# Patient Record
Sex: Female | Born: 1955
Health system: Southern US, Community
[De-identification: ages and names within clinical notes are randomized; demographics above are authoritative.]

## PROBLEM LIST (undated history)

## (undated) DIAGNOSIS — E119 Type 2 diabetes mellitus without complications: Secondary | ICD-10-CM

## (undated) DIAGNOSIS — R809 Proteinuria, unspecified: Secondary | ICD-10-CM

## (undated) DIAGNOSIS — I499 Cardiac arrhythmia, unspecified: Secondary | ICD-10-CM

## (undated) DIAGNOSIS — J309 Allergic rhinitis, unspecified: Secondary | ICD-10-CM

## (undated) DIAGNOSIS — M1990 Unspecified osteoarthritis, unspecified site: Secondary | ICD-10-CM

## (undated) DIAGNOSIS — E039 Hypothyroidism, unspecified: Secondary | ICD-10-CM

## (undated) DIAGNOSIS — E781 Pure hyperglyceridemia: Secondary | ICD-10-CM

## (undated) DIAGNOSIS — Z950 Presence of cardiac pacemaker: Secondary | ICD-10-CM

## (undated) DIAGNOSIS — I2510 Atherosclerotic heart disease of native coronary artery without angina pectoris: Secondary | ICD-10-CM

## (undated) DIAGNOSIS — R001 Bradycardia, unspecified: Secondary | ICD-10-CM

## (undated) DIAGNOSIS — K219 Gastro-esophageal reflux disease without esophagitis: Secondary | ICD-10-CM

## (undated) DIAGNOSIS — I10 Essential (primary) hypertension: Secondary | ICD-10-CM

## (undated) HISTORY — DX: Proteinuria, unspecified: R80.9

## (undated) HISTORY — DX: Pure hyperglyceridemia: E78.1

## (undated) HISTORY — DX: Type 2 diabetes mellitus without complications: E11.9

## (undated) HISTORY — PX: OTHER SURGICAL HISTORY: SHX169

## (undated) HISTORY — PX: PACEMAKER INSERTION: SHX728

## (undated) HISTORY — DX: Hypothyroidism, unspecified: E03.9

## (undated) HISTORY — DX: Essential (primary) hypertension: I10

## (undated) HISTORY — DX: Gastro-esophageal reflux disease without esophagitis: K21.9

## (undated) HISTORY — DX: Unspecified osteoarthritis, unspecified site: M19.90

## (undated) HISTORY — DX: Atherosclerotic heart disease of native coronary artery without angina pectoris: I25.10

## (undated) HISTORY — DX: Allergic rhinitis, unspecified: J30.9

---

## 1987-10-25 HISTORY — PX: CYSTECTOMY: SUR359

## 1992-10-24 HISTORY — PX: REFRACTIVE SURGERY: SHX103

## 1998-02-03 ENCOUNTER — Ambulatory Visit (HOSPITAL_COMMUNITY): Admission: RE | Admit: 1998-02-03 | Discharge: 1998-02-03 | Payer: Self-pay | Admitting: Cardiovascular Disease

## 1998-02-04 ENCOUNTER — Ambulatory Visit (HOSPITAL_COMMUNITY): Admission: RE | Admit: 1998-02-04 | Discharge: 1998-02-05 | Payer: Self-pay | Admitting: Cardiovascular Disease

## 2000-11-27 ENCOUNTER — Encounter: Admission: RE | Admit: 2000-11-27 | Discharge: 2001-02-25 | Payer: Self-pay | Admitting: Internal Medicine

## 2002-02-28 ENCOUNTER — Ambulatory Visit (HOSPITAL_COMMUNITY): Admission: RE | Admit: 2002-02-28 | Discharge: 2002-02-28 | Payer: Self-pay | Admitting: Dermatology

## 2002-03-19 ENCOUNTER — Ambulatory Visit (HOSPITAL_COMMUNITY): Admission: RE | Admit: 2002-03-19 | Discharge: 2002-03-19 | Payer: Self-pay | Admitting: Dermatology

## 2006-06-23 ENCOUNTER — Ambulatory Visit: Payer: Self-pay | Admitting: Cardiology

## 2006-08-08 ENCOUNTER — Ambulatory Visit: Payer: Self-pay | Admitting: Cardiology

## 2007-07-06 ENCOUNTER — Ambulatory Visit: Payer: Self-pay | Admitting: Cardiology

## 2007-07-19 ENCOUNTER — Ambulatory Visit: Payer: Self-pay | Admitting: Cardiology

## 2007-07-19 LAB — CONVERTED CEMR LAB
ALT: 18 units/L (ref 0–35)
BUN: 5 mg/dL — ABNORMAL LOW (ref 6–23)
Creatinine, Ser: 0.5 mg/dL (ref 0.4–1.2)
GFR calc Af Amer: 169 mL/min
Glucose, Bld: 124 mg/dL — ABNORMAL HIGH (ref 70–99)
Total Bilirubin: 0.8 mg/dL (ref 0.3–1.2)
Total Protein: 6.5 g/dL (ref 6.0–8.3)
Triglycerides: 187 mg/dL — ABNORMAL HIGH (ref 0–149)
VLDL: 37 mg/dL (ref 0–40)

## 2008-01-09 ENCOUNTER — Ambulatory Visit: Payer: Self-pay | Admitting: Cardiology

## 2008-01-17 ENCOUNTER — Ambulatory Visit: Payer: Self-pay

## 2008-07-17 ENCOUNTER — Ambulatory Visit: Payer: Self-pay | Admitting: Cardiology

## 2009-05-20 ENCOUNTER — Ambulatory Visit (HOSPITAL_COMMUNITY): Admission: RE | Admit: 2009-05-20 | Discharge: 2009-05-20 | Payer: Self-pay | Admitting: Family Medicine

## 2009-05-22 ENCOUNTER — Encounter (INDEPENDENT_AMBULATORY_CARE_PROVIDER_SITE_OTHER): Payer: Self-pay | Admitting: *Deleted

## 2009-05-29 ENCOUNTER — Ambulatory Visit (HOSPITAL_COMMUNITY): Admission: RE | Admit: 2009-05-29 | Discharge: 2009-05-29 | Payer: Self-pay | Admitting: Family Medicine

## 2009-06-08 ENCOUNTER — Encounter: Payer: Self-pay | Admitting: Internal Medicine

## 2009-06-22 DIAGNOSIS — E119 Type 2 diabetes mellitus without complications: Secondary | ICD-10-CM

## 2009-06-22 DIAGNOSIS — I10 Essential (primary) hypertension: Secondary | ICD-10-CM | POA: Insufficient documentation

## 2009-06-22 DIAGNOSIS — J309 Allergic rhinitis, unspecified: Secondary | ICD-10-CM | POA: Insufficient documentation

## 2009-06-26 ENCOUNTER — Ambulatory Visit: Payer: Self-pay | Admitting: Cardiology

## 2009-06-26 DIAGNOSIS — E783 Hyperchylomicronemia: Secondary | ICD-10-CM | POA: Insufficient documentation

## 2009-06-26 DIAGNOSIS — M79609 Pain in unspecified limb: Secondary | ICD-10-CM

## 2009-07-06 ENCOUNTER — Ambulatory Visit: Payer: Self-pay | Admitting: Internal Medicine

## 2009-07-06 ENCOUNTER — Encounter: Payer: Self-pay | Admitting: Internal Medicine

## 2009-07-06 ENCOUNTER — Ambulatory Visit (HOSPITAL_COMMUNITY): Admission: RE | Admit: 2009-07-06 | Discharge: 2009-07-06 | Payer: Self-pay | Admitting: Internal Medicine

## 2009-07-08 ENCOUNTER — Encounter: Payer: Self-pay | Admitting: Internal Medicine

## 2009-07-09 ENCOUNTER — Ambulatory Visit: Payer: Self-pay

## 2009-07-09 ENCOUNTER — Ambulatory Visit: Payer: Self-pay | Admitting: Cardiology

## 2009-07-10 LAB — CONVERTED CEMR LAB
BUN: 8 mg/dL (ref 6–23)
Chloride: 101 meq/L (ref 96–112)
Cholesterol: 178 mg/dL (ref 0–200)
Creatinine, Ser: 0.5 mg/dL (ref 0.4–1.2)
GFR calc non Af Amer: 138.41 mL/min (ref >60)
Glucose, Bld: 68 mg/dL — ABNORMAL LOW (ref 70–99)
Potassium: 3.6 meq/L (ref 3.5–5.1)
Sodium: 139 meq/L (ref 135–145)
Total Bilirubin: 0.9 mg/dL (ref 0.3–1.2)
Total CHOL/HDL Ratio: 5
Total Protein: 7.4 g/dL (ref 6.0–8.3)
Triglycerides: 177 mg/dL — ABNORMAL HIGH (ref 0.0–149.0)

## 2009-07-13 ENCOUNTER — Encounter: Payer: Self-pay | Admitting: Cardiology

## 2010-04-05 ENCOUNTER — Telehealth (INDEPENDENT_AMBULATORY_CARE_PROVIDER_SITE_OTHER): Payer: Self-pay | Admitting: *Deleted

## 2010-04-06 ENCOUNTER — Ambulatory Visit (HOSPITAL_COMMUNITY): Admission: RE | Admit: 2010-04-06 | Discharge: 2010-04-06 | Payer: Self-pay | Admitting: Family Medicine

## 2010-10-04 ENCOUNTER — Ambulatory Visit: Payer: Self-pay | Admitting: Cardiology

## 2010-10-04 ENCOUNTER — Encounter (INDEPENDENT_AMBULATORY_CARE_PROVIDER_SITE_OTHER): Payer: Self-pay | Admitting: *Deleted

## 2010-10-05 ENCOUNTER — Encounter: Payer: Self-pay | Admitting: Cardiology

## 2010-11-19 ENCOUNTER — Ambulatory Visit (HOSPITAL_COMMUNITY)
Admission: RE | Admit: 2010-11-19 | Discharge: 2010-11-19 | Payer: Self-pay | Source: Home / Self Care | Attending: Family Medicine | Admitting: Family Medicine

## 2010-11-25 NOTE — Progress Notes (Signed)
  Faxed Pt ROI, she completed faxed back to me, I faxed Dopplers over to [facility] in [place] to [phone].Marland KitchenMarland KitchenCall back [phone] [facility]  April 05, 2010 10:42 AM

## 2010-11-25 NOTE — Letter (Signed)
Summary: [place] [employer] Work [employer], agricultural at [employer]  [address], [place] [address]   Phone: [phone]  Fax: [phone]     October 04, 2010 MRN: [id number]   Jordan Perry [address] [place], [place]  [address]      YOUR LAB WORK IS DUE   December 03, 2010  Please go to [facility], located across the street from [facility] on the second floor.  Hours are Monday - Friday 7am until 7:30pm         Saturday 8am until 12noon    _X_  DO NOT EAT OR DRINK AFTER MIDNIGHT EVENING PRIOR TO LABWORK

## 2010-11-25 NOTE — Assessment & Plan Note (Signed)
Summary: past due for f/u per pt phone call/tg   Visit Type:  Follow-up Primary Domique Clapper:  Dr.Golding  CC:  no cardiology complaints.  History of Present Illness: Jordan Perry comes in today for evaluation and management of subclinical coronary artery disease and multiple cardiac risk factors.  She's having no angina or ischemic symptoms with routine work and hard housework. We performed a stress nuclear study in 2009 which showed no ischemia.  On her last visit, I was concerned she may be having symptoms of intermittent claudication. However, arterial Dopplers were normal.  She denies any orthopnea, PND but has had some mild edema. She had a spider bite on her leg that several trips to the wound center to clear.  She is on 80 mg of simvastatin. She has not had lipids in the past year.  Her hemoglobin A1c runs about 7%. Her weight is down 20 pounds.  Current Medications (verified): 1)  Lipitor 80 Mg Tabs (Atorvastatin Calcium) .... Take 1 Tablet By Mouth Once A Day 2)  Aspirin 81 Mg Tbec (Aspirin) .... Take One Tablet By Mouth Daily 3)  Metformin Hcl 500 Mg Tabs (Metformin Hcl) .... 2 Tabs Two Times A Day 4)  Trivora (28)  Tabs (Levonorg-Eth Estrad Triphasic) .... As Directed 5)  Novolog Mix 70/30 Flexpen 70-30 % Susp (Insulin Aspart Prot & Aspart) .... Sliding Scale 6)  Lisinopril 20 Mg Tabs (Lisinopril) .... Take One Tablet By Mouth Daily 7)  Synthroid 75 Mcg Tabs (Levothyroxine Sodium) .... Take 1 Tab Daily 8)  Vitamin C 1000 Mg Tabs (Ascorbic Acid) .... Take 1 Tab Daily 9)  Azithromycin 250 Mg Tabs (Azithromycin) .... Take 1 Tab Daily  Allergies (verified): 1)  ! Pcn 2)  ! Prednisone  Comments:  Nurse/Medical Assistant: patient brought med list reviewed with patient she is on a z-pak for sinus infection  Past History:  Past Medical History: Last updated: 06/22/2009 CAD, UNSPECIFIED SITE/ SUBCLINICAL (ICD-414.00) HYPERTRIGLYCERIDEMIA (ICD-272.1) HYPERTENSION, UNSPECIFIED  (ICD-401.9) DIABETES MELLITUS, TYPE II (ICD-250.00) ALLERGIC RHINITIS (ICD-477.9)      Past Surgical History: Last updated: 06/22/2009 R-K eye surgery..[id number] Cyst removed off end of spine..1989  Family History: Last updated: 06/22/2009 Family History of Coronary Artery Disease:  Family History of Diabetes:  Family History of Obesity: Significant for heart disease in her father.  Brother who died of a heart attack at age 55, was a patient of mine and a sister who is about her age who has coronary disease. Family History of Hyperlipidemia:  Family History of Hypertension:  Mother: deceased at 82 COPD Father: deceased at 6 Heart&Cancer  Social History: Last updated: 06/22/2009 She is single.  She lives in [place].  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 Tobacco Use - No.  Alcohol Use - no Drug Use - no  Risk Factors: Smoking Status: never (06/22/2009)  Review of Systems       negative other than history of present illness  Vital Signs:  Patient profile:   55 year old female Weight:      235 pounds BMI:     40.48 O2 Sat:      97 % on Room air Pulse rate:   77 / minute BP sitting:   140 / 78  (right arm)  Vitals Entered By: Dreama Saa, CNA (October 04, 2010 9:48 AM)  O2 Flow:  Room air  Physical Exam  General:  obese.  no acute distressobese.   Head:  normocephalic and atraumatic Eyes:  PERRLA/EOM intact; conjunctiva and lids normal. Neck:  Neck supple, no JVD. No masses, thyromegaly or abnormal cervical nodes. Chest Wall:  no deformities or breast masses noted Lungs:  Clear bilaterally to auscultation and percussion. Heart:  Pdifficult to appreciate, regular rate and rhythm, normal S1-S2 splits, carotid upstrokes equal bilaterally without bruits. Msk:  Back normal, normal gait. Muscle strength and tone normal. Pulses:  diminished but present the lower extremity Extremities:  chronic changes in the lower legs above the ankles. Only  trace pitting edema. Neurologic:  Alert and oriented x 3. Skin:  Intact without lesions or rashes. Psych:  Normal affect.   EKG  Procedure date:  10/04/2010  Findings:      normal sinus rhythm, normal EKG  Impression & Recommendations:  Problem # 1:  CAD, UNSPECIFIED SITE/ SUBCLINICAL (ICD-414.00) Assessment Unchanged we'll continue aggressive risk factor modification. Her updated medication list for this problem includes:    Aspirin 81 Mg Tbec (Aspirin) .Marland Kitchen... Take one tablet by mouth daily    Lisinopril 20 Mg Tabs (Lisinopril) .Marland Kitchen... Take one tablet by mouth daily  Problem # 2:  HYPERTENSION, UNSPECIFIED (ICD-401.9) Assessment: Deteriorated Will increase her lisinopril to 20 mg per day. Patient informed that systolic blood pressure goal is 130 or less. Her updated medication list for this problem includes:    Aspirin 81 Mg Tbec (Aspirin) .Marland Kitchen... Take one tablet by mouth daily    Lisinopril 20 Mg Tabs (Lisinopril) .Marland Kitchen... Take one tablet by mouth daily  Problem # 3:  HYPERLIPIDEMIA TYPE I / IV (ICD-272.3) Will change to atorvastatin 80 mg q. day. Followup blood work in 6 weeks. Her updated medication list for this problem includes:    Lipitor 80 Mg Tabs (Atorvastatin calcium) .Marland Kitchen... Take 1 tablet by mouth once a day  Future Orders: T-Lipid Profile ([id number]) ... 12/03/2010 T-Comprehensive Metabolic Panel [id number]) ... 12/03/2010  Problem # 4:  DIABETES MELLITUS, TYPE II (ICD-250.00) Encouraged to continue to lose weight and decrease her hemoglobin A1c further. The following medications were removed from the medication list:    Actos 15 Mg Tabs (Pioglitazone hcl) .Marland Kitchen... 1 tab once daily Her updated medication list for this problem includes:    Aspirin 81 Mg Tbec (Aspirin) .Marland Kitchen... Take one tablet by mouth daily    Metformin Hcl 500 Mg Tabs (Metformin hcl) .Marland Kitchen... 2 tabs two times a day    Novolog Mix 70/30 Flexpen 70-30 % Susp (Insulin aspart prot & aspart) ..... Sliding  scale    Lisinopril 20 Mg Tabs (Lisinopril) .Marland Kitchen... Take one tablet by mouth daily  Problem # 5:  LEG PAIN (ICD-729.5) Assessment: Unchanged  Patient Instructions: 1)  Your physician recommends that you schedule a follow-up appointment in: 1 2)  Your physician recommends that you return for lab work in: 6 weeks 3)  Your physician has recommended you make the following change in your medication: stop simvastatin after current rx complete and begin lipitor 80mg  dialy, increase lisinopril to 20mg  daily Prescriptions: LISINOPRIL 20 MG TABS (LISINOPRIL) Take one tablet by mouth daily  #90 x 3   Entered by:   Teressa Lower RN   Authorized by:   Gaylord Shih, MD, [facility]   Signed by:   Teressa Lower RN on 10/04/2010   Method used:   Faxed to ...       [facility]* (retail)             ,          Ph: [phone]  Fax: [phone]   RxID:   [id number] LIPITOR 80 MG TABS (ATORVASTATIN CALCIUM) Take 1 tablet by mouth once a day  #90 x 3   Entered by:   Teressa Lower RN   Authorized by:   Gaylord Shih, MD, [facility]   Signed by:   Teressa Lower RN on 10/04/2010   Method used:   Print then Give to Patient   RxID:   [id number]

## 2010-12-03 ENCOUNTER — Other Ambulatory Visit: Payer: Self-pay | Admitting: Orthopedic Surgery

## 2010-12-03 DIAGNOSIS — M545 Low back pain: Secondary | ICD-10-CM

## 2010-12-04 ENCOUNTER — Ambulatory Visit
Admission: RE | Admit: 2010-12-04 | Discharge: 2010-12-04 | Disposition: A | Discharge status: Home / Self Care | Payer: BC Managed Care – PPO | Source: Ambulatory Visit | Attending: Orthopedic Surgery | Admitting: Orthopedic Surgery

## 2010-12-04 DIAGNOSIS — M545 Low back pain: Secondary | ICD-10-CM

## 2010-12-10 ENCOUNTER — Encounter: Payer: Self-pay | Admitting: Cardiology

## 2010-12-14 ENCOUNTER — Encounter: Payer: Self-pay | Admitting: Cardiology

## 2010-12-17 LAB — CONVERTED CEMR LAB
AST: 7 units/L (ref 0–37)
Albumin: 3.6 g/dL (ref 3.5–5.2)
Alkaline Phosphatase: 94 units/L (ref 39–117)
Calcium: 9 mg/dL (ref 8.4–10.5)
Glucose, Bld: 122 mg/dL — ABNORMAL HIGH (ref 70–99)
LDL Cholesterol: 52 mg/dL (ref 0–99)
Total Bilirubin: 0.3 mg/dL (ref 0.3–1.2)
Total CHOL/HDL Ratio: 2.9
Triglycerides: 126 mg/dL (ref <150)

## 2010-12-21 NOTE — Letter (Signed)
Summary: External Correspondence  External Correspondence   Imported By: Faythe Ghee 12/14/2010 13:29:50  _____________________________________________________________________  External Attachment:    Type:   Image     Comment:   External Document  Appended Document: External Correspondence Dr. Vern Claude patient. Will forward.

## 2011-03-08 NOTE — Assessment & Plan Note (Signed)
[facility]CARE                            CARDIOLOGY OFFICE NOTE   NAME:Perry, Jordan ARCHIBALD                      MRN:          [id number]  DATE:01/09/2008                            DOB:          [birth year]    [name] returns today for further management of her multiple cardiac  risk factors and subclinical coronary disease.  She has a very strong  family history of coronary disease, type 2 diabetes, obesity, sedentary  lifestyle, hyperlipidemia, and hypertension.   She denies any symptoms of angina or ischemia.  We obtained blood work  on July 19, 2007 which showed a total cholesterol of 133,  triglycerides 187, HDL 40.2, LDL 55 down from 104.  This on simvastatin  40 mg q.h.s.  Her LFTs were normal, and her chemistries were normal.   She says her blood pressure runs about 130/75.  She is always a little  bit high in the office.  Her blood sugar runs about 130 on the average.  She is now taking Lantus injection 40 units at night.   The last time she was here, I placed her on lisinopril for renal  preservation.  She is on lisinopril 10 mg a day.   MEDICATIONS:  1. Lisinopril 10 mg a day.  2. Aspirin 81 mg a day.  3. Glipizide XL 10 mg b.i.d.  4. Actos 15 mg a day.  5. Women's multivitamin.  6. Lantus injection 40 units per day.  7. Metformin 1000 mg p.o. b.i.d..  8. Simvastatin 40 mg p.o. q.h.s.  9. Lisinopril 10 mg a day.  10.Trivora tablets birth control.   PHYSICAL EXAMINATION:  VITAL SIGNS:  Blood pressure 140/86, her pulse is  92 and regular.  Weight is 243.  HEENT:  Unchanged.  NECK:  Carotid upstrokes are equal bilateral without bruits, no JVD.  Thyroid is not enlarged.  Trachea is midline.  LUNGS:  Clear.  HEART:  Poorly appreciated PMI.  She has soft S1-S2 that splits  physiologically.  ABDOMEN:  Obese with good bowel sounds.  Organomegaly could not be  assessed.  EXTREMITIES:  Only trace edema.  Pulses are present.  NEUROLOGIC:  Intact.  SKIN:  Unremarkable.   Electrocardiogram shows sinus rhythm with a rightward axis.  She has  some nonspecific ST-segment changes.   [name] is at high risk for an acute coronary syndrome with her  multiple risk factors, particularly her premature family history.  We  reinforced all the therapy lifestyle changes that she needs to embrace.  We have arranged for her to have a rest stress adenosine Myoview, last  being March of 2007.  She will most likely have silent ischemia or an  acute coronary event without symptoms.  This seems to be the way it has  occurred in her family in the past.   Assuming this is negative for obstructive coronary disease or ischemia,  I will see her back again in September.  At that time, she will need  blood work including lipids LFTs, and a comprehensive metabolic panel.   I have also increased her lisinopril to  20 mg a day for better blood  pressure control not to mention renal sparing effects.     Thomas C. Daleen Squibb, MD, [facility]  Electronically Signed    TCW/MedQ  DD: 01/09/2008  DT: 01/10/2008  Job #: [id number]   cc:   Patrica Duel, M.D.

## 2011-03-08 NOTE — Assessment & Plan Note (Signed)
[facility] HEALTHCARE                            CARDIOLOGY OFFICE NOTE   NAME:Jordan Perry, Jordan Perry                      MRN:          [id number]  DATE:07/06/2007                            DOB:          04/12/1956    [name] comes in today for further management of her cardiac risk  factors and subclinical coronary disease.  Please see my note from  June 23, 2006.   Her initial response to simvastatin showed an LDL of above 100.  We  bumped it to 40 mg.  She has not had a followup blood work.   Her diabetes is being aggressively treated by Dr. Talmage Nap.  I do not have  any of her numbers, but she is on a very complex regimen.   Her weight is down about 8 to 10 pounds.  She is having no angina or  ischemic symptoms.  Her blood pressure is usually under good control,  she says, unless she is stressed.  She was stressed this morning because  of a flat tire.   MEDICATIONS:  1. Aspirin 81 mg a day.  2. Actos 15 mg a day.  3. Lantus injection.  4. Metformin extended release 500 b.i.d.  5. Colchicine 0.6 b.i.d.  6. Lexapro 20 mg daily.  7. Simvastatin 40 mg nightly.   Blood pressure is 142/92 with a large cuff.  Pulse is 76 and regular.  Her EKG is normal, except for a slight rightward axis.  Her weight is  down to 226.  HEENT:  Normocephalic, atraumatic.  PERRLA.  Extraocular muscles are  intact.  Sclerae clear.  Facial symmetry is normal.  Carotids are full.  There are no bruits.  There is no JVD.  Thyroid is  not enlarged.  LUNGS:  Clear.  HEART:  Reveals a nondisplaced PMI.  She has a normal S1, S2.  ABDOMEN:  Obese with good bowel sounds.  Organomegaly cannot be  assessed.  EXTREMITIES:  Reveal 1+ edema.  Pulses are intact.  NEURO:  Grossly intact.   I am concerned about [name]'s blood pressure and lack of followup blood  work.   With her diabetes, I placed her on low-dose lisinopril 10 mg a day for  her blood pressure and preserving renal function.   I have also put her  in the computer for fasting lipids, LFTs, and a CHEM-7.  Assuming these  are stable, I will see her back in a year.     Thomas C. Daleen Squibb, MD, [facility]  Electronically Signed    TCW/MedQ  DD: 07/06/2007  DT: 07/07/2007  Job #: [id number]   cc:   Patrica Duel, M.D.  Dorisann Frames, M.D.

## 2011-03-08 NOTE — Assessment & Plan Note (Signed)
[facility] HEALTHCARE                            CARDIOLOGY OFFICE NOTE   NAME:Jordan Perry, Jordan Perry                      MRN:          [id number]  DATE:07/17/2008                            DOB:          December 22, 1955    Jordan Perry comes in today for followup with her multiple cardiac risk  factors.   She is having no angina or ischemic symptoms.  She says her blood sugar  has been under good control with the help of Dr. Talmage Nap.  She saw Dr.  Talmage Nap this morning, had blood drawn including lipids.   She had a negative stress Myoview ruling out obstructive coronary artery  disease on January 17, 2008.  Her EF was 68%.   Of note, she is no longer on lisinopril.  She says Dr. Talmage Nap took her  off it.  I had increased it from 10-20 last visit because of blood  pressure.   PHYSICAL EXAMINATION:  VITAL SIGNS:  Her blood pressure today is 118/70,  her pulse 87 and regular, weight is 245.  HEENT:  Normal.  NECK:  Carotid upstrokes are equal bilaterally without bruits.  No JVD.  Thyroid is not enlarged.  Trachea is midline.  LUNGS:  Clear.  HEART:  Reveals poorly appreciated PMI.  Normal S1 and S2.  ABDOMEN:  Soft, good bowel sounds.  No midline bruit.  No hepatomegaly.  EXTREMITIES:  There were no cyanosis, clubbing.  There is trace edema.  Pulses are intact.  NEUROLOGIC:  Intact.   EKG is normal except for slight rightward axis.   ASSESSMENT AND PLAN:  Jordan Perry needs to go back on lisinopril 10 mg a  day for at least renal protection and better blood pressure control.  I  have placed her on 10 mg a day.  Assuming she is doing well, I will see  her back in a year.      Thomas C. Daleen Squibb, MD, [facility]  Electronically Signed    TCW/MedQ  DD: 07/17/2008  DT: 07/18/2008  Job #: [id number]   cc:   Dorisann Frames, M.D.  Patrica Duel, M.D.

## 2011-03-11 NOTE — Assessment & Plan Note (Signed)
[facility] HEALTHCARE                              CARDIOLOGY OFFICE NOTE   Jordan Perry                    MRN:          [id number]  DATE:06/23/2006                            DOB:          17-Nov-1955    Ms. Jordan Perry returns today after not seeing [place] since 2001.   She is a delightful 55 year old white female, sister of a patient of mine,  who has very strong family history of coronary disease, diabetes, and  obesity.   She is followed by Dr. Nobie Putnam at [facility] in [place].   She had a stress Persantine Myoview on December 27, 2005, at [facility].  This showed no evidence of ischemia or infarct.  Ejection fraction was not performed.   She has had no angina or ischemia symptoms.  She does have high risk and is  quite concerned about being followed closely by a cardiologist.   PAST MEDICAL HISTORY:  SHE IS INTOLERANT OF:  1. PENICILLIN.  2. PREDNISONE.   MEDICATIONS:  1. Aspirin 81 mg a day.  2. Glipizide XL 10 mg b.i.d.  3. __________  50/1,000 b.i.d.  4. Actos 15 mg a day.  5. Women's multivitamin.  6. Bioflex.  7. __________ .   She does not smoke or drink.   FAMILY HISTORY:  Significant for heart disease in her father.  Brother who  died of a heart attack at age 32, was a patient of mine and a sister who is  about her age who has coronary disease.   SOCIAL HISTORY:  She is single.  She lives in [place].  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   REVIEW OF SYSTEMS:  Other than the HPI, is really negative.  Her fasting  blood sugars have dropped down to the low 100s from 200, since she saw Dr.  Talmage Nap at [facility] for her diabetes.   PHYSICAL EXAMINATION:  VITAL SIGNS:  Today her blood pressure is 132/88.  Her up is 81 and regular.  She is 5 foot 4 inches and weighs 233 pounds.  HEENT:  Normocephalic atraumatic.  PERRLA.  Extraocular movements  intact.  Facial symmetry is normal.  Dentition is satisfactory.  Carotids are full  bilaterally without bruits.  No JVD.  Thyroid is not enlarged.  Trachea is  midline.  LUNGS:  Clear.  HEART:  Reveals a soft S1 S2 without murmur, gallop or rub.  ABDOMEN:  Soft with good bowel sounds.  EXTREMITIES:  Reveal no edema.  Pulses are present.   Her EKG is normal.   ASSESSMENT/PLAN:  Jordan Perry currently has subclinical coronary disease, given  her diabetes and her other risk factors.  I am delighted that her blood  sugar is under better control.  I have encouraged her to loose weight.   In addition, I note that her total cholesterol on no medicine is 176,  triglycerides 224, HDL 45, LDL 86.  With her being a coronary disease  equivalent, I have asked her to go on Simvastatin 20 mg q.h.s.  I would like  her LDL as low as possible.  I would see a slight increase in her HDL as  well.   I will follow blood work on her in 6 weeks, otherwise I will see her back in  a year.                               Thomas C. Daleen Squibb, MD, [facility]    TCW/MedQ  DD:  06/23/2006  DT:  06/23/2006  Job #:  [id number]   cc:   Dorisann Frames, MD  Patrica Duel, MD

## 2011-09-26 ENCOUNTER — Other Ambulatory Visit (HOSPITAL_COMMUNITY): Payer: Self-pay | Admitting: Anesthesiology

## 2011-09-26 ENCOUNTER — Ambulatory Visit (HOSPITAL_COMMUNITY)
Admission: RE | Admit: 2011-09-26 | Discharge: 2011-09-26 | Disposition: A | Discharge status: Home / Self Care | Payer: BC Managed Care – PPO | Source: Ambulatory Visit | Attending: Anesthesiology | Admitting: Anesthesiology

## 2011-09-26 DIAGNOSIS — R52 Pain, unspecified: Secondary | ICD-10-CM

## 2011-09-26 DIAGNOSIS — M542 Cervicalgia: Secondary | ICD-10-CM | POA: Insufficient documentation

## 2011-09-26 DIAGNOSIS — M25519 Pain in unspecified shoulder: Secondary | ICD-10-CM | POA: Insufficient documentation

## 2011-10-03 ENCOUNTER — Other Ambulatory Visit: Payer: Self-pay | Admitting: Cardiology

## 2011-10-05 ENCOUNTER — Encounter: Payer: Self-pay | Admitting: *Deleted

## 2011-10-06 ENCOUNTER — Ambulatory Visit (INDEPENDENT_AMBULATORY_CARE_PROVIDER_SITE_OTHER): Payer: BC Managed Care – PPO | Admitting: Cardiology

## 2011-10-06 ENCOUNTER — Encounter: Payer: Self-pay | Admitting: Cardiology

## 2011-10-06 VITALS — BP 126/76 | HR 82 | Ht 64.0 in | Wt 233.0 lb

## 2011-10-06 DIAGNOSIS — I10 Essential (primary) hypertension: Secondary | ICD-10-CM

## 2011-10-06 DIAGNOSIS — E783 Hyperchylomicronemia: Secondary | ICD-10-CM

## 2011-10-06 DIAGNOSIS — E669 Obesity, unspecified: Secondary | ICD-10-CM | POA: Insufficient documentation

## 2011-10-06 DIAGNOSIS — E119 Type 2 diabetes mellitus without complications: Secondary | ICD-10-CM

## 2011-10-06 DIAGNOSIS — I2510 Atherosclerotic heart disease of native coronary artery without angina pectoris: Secondary | ICD-10-CM

## 2011-10-06 NOTE — Patient Instructions (Signed)
Your physician wants you to follow-up in: 1 year with Dr. Daleen Squibb. You will receive a reminder letter in the mail two months in advance. If you don't receive a letter, please call our office to schedule the follow-up appointment.  Your physician encouraged you to lose weight for better health.  Continue to work on reducing your carbohydrates and improving your Hemoglobin A1C

## 2011-10-06 NOTE — Assessment & Plan Note (Signed)
With her multiple cardiac risk factors, not to mention diabetes, she has chronically occult coronary disease. He is asymptomatic. She knows what to look for as far as angina or ischemic symptoms.  I will see her back in a year. I have strongly encouraged her to get her hemoglobin A1c 7%. She'll continue with her secondary preventative strategies.

## 2011-10-06 NOTE — Progress Notes (Signed)
HPI Jordan Perry comes in today for evaluation and management of her subclinical coronary disease. His multiple cardiac risk factors as outlined in previous notes. She is having no angina or ischemic symptoms when carefully questioned.  Her last hemoglobin A1c was 8.2%. She is being followed by endocrinology for both that and her lipids.  Her weight is unchanged. She does not exercise on a regular basis. She says she's bad to eat late at night after getting home from work.  She denies orthopnea, PND or edema. She denies palpitations or presyncope.  Past Medical History  Diagnosis Date  . Coronary atherosclerosis of unspecified type of vessel, native or graft   . Pure hyperglyceridemia   . Unspecified essential hypertension   . Type II or unspecified type diabetes mellitus without mention of complication, not stated as uncontrolled   . Allergic rhinitis, cause unspecified   . Hypothyroidism   . Proteinuria     Current Outpatient Prescriptions  Medication Sig Dispense Refill  . aspirin 81 MG tablet Take 81 mg by mouth daily.        Marland Kitchen atorvastatin (LIPITOR) 80 MG tablet TAKE 1 TABLET DAILY  90 tablet  2  . exenatide (BYETTA) 5 MCG/0.02ML SOLN Inject 5 mcg into the skin 2 (two) times daily with a meal.        . furosemide (LASIX) 40 MG tablet Take 40 mg by mouth daily.        . insulin aspart protamine-insulin aspart (NOVOLOG 70/30) (70-30) 100 UNIT/ML injection Inject into the skin. Sliding scale        . levothyroxine (SYNTHROID, LEVOTHROID) 75 MCG tablet Take 75 mcg by mouth daily.        Marland Kitchen lisinopril (PRINIVIL,ZESTRIL) 20 MG tablet TAKE 1 TABLET DAILY  90 tablet  2  . metFORMIN (GLUCOPHAGE) 500 MG tablet Take 500 mg by mouth 2 (two) times daily with a meal.          Allergies  Allergen Reactions  . Penicillins     REACTION: rash, edema  . Prednisone     Family History  Problem Relation Age of Onset  . Coronary artery disease    . Diabetes    . Obesity    . Heart disease  Father     and heart problems (unspecified0  . Heart attack Brother 50  . Coronary artery disease Sister   . Hyperlipidemia    . Hypertension    . COPD Mother 87  . Cancer Father 60    History   Social History  . Marital Status: Married    Spouse Name: N/A    Number of Children: 0  . Years of Education: N/A   Occupational History  . 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 Smoking status: Never Smoker   . Smokeless tobacco: Never Used  . Alcohol Use: No  . Drug Use: No  . Sexually Active: Not on file   Other Topics Concern  . Not on file   Social History Narrative  . No narrative on file    ROS ALL NEGATIVE EXCEPT THOSE NOTED IN HPI  PE  General Appearance: well developed, well nourished in no acute distress, obese HEENT: symmetrical face, PERRLA, good dentition  Neck: no JVD, thyromegaly, or adenopathy, trachea midline Chest: symmetric without deformity Cardiac: PMI non-displaced, RRR, normal S1, S2, no gallop or murmur Lung: clear to ausculation and percussion Vascular: all pulses full without  bruits  Abdominal: nondistended, nontender, good bowel sounds, no HSM, no bruits Extremities: no cyanosis, clubbing or edema, no sign of DVT, no varicosities  Skin: normal color, no rashes Neuro: alert and oriented x 3, non-focal Pysch: normal affect  EKG Normal sinus rhythm, normal EKG BMET    Component Value Date/Time   NA 139 12/10/2010 1748   K 4.2 12/10/2010 1748   CL 104 12/10/2010 1748   CO2 23 12/10/2010 1748   GLUCOSE 122* 12/10/2010 1748   BUN 10 12/10/2010 1748   CREATININE 0.48 12/10/2010 1748   CALCIUM 9.0 12/10/2010 1748   GFRNONAA 138.41 07/09/2009 1221   GFRAA 169 07/19/2007 0839    Lipid Panel     Component Value Date/Time   CHOL 117 12/10/2010 1748   TRIG 126 12/10/2010 1748   HDL 40 12/10/2010 1748   CHOLHDL 2.9 Ratio 12/10/2010 1748   VLDL 25 12/10/2010 1748   LDLCALC 52 12/10/2010 1748    CBC No  results found for this basename: wbc, rbc, hgb, hct, plt, mcv, mch, mchc, rdw, neutrabs, lymphsabs, monoabs, eosabs, basosabs

## 2011-10-12 ENCOUNTER — Other Ambulatory Visit (HOSPITAL_COMMUNITY): Payer: Self-pay | Admitting: Anesthesiology

## 2011-10-12 DIAGNOSIS — M25519 Pain in unspecified shoulder: Secondary | ICD-10-CM

## 2011-10-28 ENCOUNTER — Inpatient Hospital Stay (HOSPITAL_COMMUNITY): Admission: RE | Admit: 2011-10-28 | Payer: BC Managed Care – PPO | Source: Ambulatory Visit

## 2011-10-28 ENCOUNTER — Other Ambulatory Visit (HOSPITAL_COMMUNITY): Payer: Self-pay | Admitting: Anesthesiology

## 2011-10-28 ENCOUNTER — Ambulatory Visit (HOSPITAL_COMMUNITY)
Admission: RE | Admit: 2011-10-28 | Discharge: 2011-10-28 | Disposition: A | Discharge status: Home / Self Care | Payer: BC Managed Care – PPO | Source: Ambulatory Visit | Attending: Anesthesiology | Admitting: Anesthesiology

## 2011-10-28 DIAGNOSIS — M25519 Pain in unspecified shoulder: Secondary | ICD-10-CM

## 2011-10-28 DIAGNOSIS — R209 Unspecified disturbances of skin sensation: Secondary | ICD-10-CM | POA: Insufficient documentation

## 2011-10-28 DIAGNOSIS — M5030 Other cervical disc degeneration, unspecified cervical region: Secondary | ICD-10-CM | POA: Insufficient documentation

## 2011-11-21 ENCOUNTER — Other Ambulatory Visit: Payer: Self-pay | Admitting: Cardiology

## 2011-11-21 NOTE — Telephone Encounter (Signed)
1 rx at [employer] [facility] and other to [facility]. Pt is out

## 2011-11-22 MED ORDER — ATORVASTATIN CALCIUM 80 MG PO TABS: 80.0000 mg | ORAL_TABLET | Freq: Every day | ORAL | Status: DC

## 2011-11-22 NOTE — Telephone Encounter (Signed)
**Note De-identified Delano Scardino Obfuscation** [place] pt. 

## 2012-06-17 ENCOUNTER — Other Ambulatory Visit: Payer: Self-pay | Admitting: Cardiology

## 2012-08-13 ENCOUNTER — Other Ambulatory Visit: Payer: Self-pay | Admitting: Cardiology

## 2012-10-10 ENCOUNTER — Encounter: Payer: Self-pay | Admitting: Physician Assistant

## 2012-10-10 ENCOUNTER — Ambulatory Visit (INDEPENDENT_AMBULATORY_CARE_PROVIDER_SITE_OTHER): Payer: BC Managed Care – PPO | Admitting: Physician Assistant

## 2012-10-10 ENCOUNTER — Ambulatory Visit: Payer: BC Managed Care – PPO | Admitting: Cardiology

## 2012-10-10 VITALS — BP 120/80 | HR 85 | Ht 62.0 in | Wt 225.0 lb

## 2012-10-10 DIAGNOSIS — E783 Hyperchylomicronemia: Secondary | ICD-10-CM

## 2012-10-10 DIAGNOSIS — I10 Essential (primary) hypertension: Secondary | ICD-10-CM

## 2012-10-10 DIAGNOSIS — E119 Type 2 diabetes mellitus without complications: Secondary | ICD-10-CM

## 2012-10-10 DIAGNOSIS — E669 Obesity, unspecified: Secondary | ICD-10-CM

## 2012-10-10 DIAGNOSIS — K219 Gastro-esophageal reflux disease without esophagitis: Secondary | ICD-10-CM | POA: Insufficient documentation

## 2012-10-10 DIAGNOSIS — I2510 Atherosclerotic heart disease of native coronary artery without angina pectoris: Secondary | ICD-10-CM

## 2012-10-10 NOTE — Progress Notes (Signed)
HPI:  This is a pleasant 56 year old white female patient of Dr. Juanito Doom who has a history of subclinical coronary artery disease with multiple cardiac risk factors and normal stress Myoview in 2009. She is here for a yearly checkup.  Overall she is doing well without any cardiac symptoms. Her main complaint is an increasing indigestion over the past several months. She describes a lot of reflux after she and burning up into her her esophagus. She is under a tremendous amount of stress at work and at home. She says comes usually relieves it. She is a diabetic and is overweight. She does not exercise on a regular basis.  Allergies:  -- Penicillins    --  REACTION: rash, edema  -- Prednisone   Current Outpatient Prescriptions on File Prior to Visit: aspirin 81 MG tablet, Take 81 mg by mouth daily.  , Disp: , Rfl:  atorvastatin (LIPITOR) 80 MG tablet, TAKE 1 TABLET DAILY, Disp: 90 tablet, Rfl: 1 exenatide (BYETTA) 5 MCG/0.02ML SOLN, Inject 5 mcg into the skin 2 (two) times daily with a meal.  , Disp: , Rfl:  furosemide (LASIX) 40 MG tablet, Take 40 mg by mouth daily.  , Disp: , Rfl:   insulin aspart protamine-insulin aspart (NOVOLOG 70/30) (70-30) 100 UNIT/ML injection, Inject into the skin. Sliding scale , Disp: , Rfl:  levothyroxine (SYNTHROID, LEVOTHROID) 75 MCG tablet, Take 75 mcg by mouth daily.  , Disp: , Rfl:  lisinopril (PRINIVIL,ZESTRIL) 20 MG tablet, TAKE 1 TABLET DAILY, Disp: 90 tablet, Rfl: 1 metFORMIN (GLUCOPHAGE) 500 MG tablet, Take 500 mg by mouth 2 (two) times daily with a meal.  , Disp: , Rfl:     Past Medical History:   Coronary atherosclerosis of unspecified type o*              Pure hyperglyceridemia                                       Unspecified essential hypertension                           Type II or unspecified type diabetes mellitus *              Allergic rhinitis, cause unspecified                         Hypothyroidism                                                Proteinuria                                                 Past Surgical History:   REFRACTIVE SURGERY                              1994         CYSTECTOMY  1989           Comment:removed from end of spine  Review of patient's family history indicates:   Coronary artery disease                                 Diabetes                                                Obesity                                                 Heart disease                  Father                     Comment: and heart problems (unspecified0   Heart attack                   Brother                  Coronary artery disease        Sister                   Hyperlipidemia                                          Hypertension                                            COPD                           Mother                   Cancer                         Father                   Social History   Marital Status: Married             Spouse Name:                      Years of Education:                 Number of children: 0           Occupational History Occupation          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 Neck: No JVD, HJR, Bruit, or thyroid enlargement  Lungs: No tachypnea, clear without wheezing, rales, or rhonchi  Cardiovascular: RRR, PMI not displaced, 2/6 systolic murmur at the left sternal border, no gallops, bruit, thrill, or  heave.  Abdomen: BS normal. Soft without organomegaly, masses, lesions or tenderness.  Extremities: without cyanosis, clubbing or edema. Good distal pulses bilateral  SKin: Warm, no lesions or rashes   Musculoskeletal: No deformities  Neuro: no focal signs  BP 120/80  Pulse 85  Ht 5\' 2"  (1.575 m)  Wt 225 lb (102.059 kg)  BMI 41.15 kg/m2  SpO2 96%   ZOX:WRUEAV sinus rhythm nonspecific ST-T wave changes no acute change

## 2012-10-10 NOTE — Assessment & Plan Note (Signed)
Followed closely by endocrinologist

## 2012-10-10 NOTE — Assessment & Plan Note (Signed)
Followed by Dr. Golding. 

## 2012-10-10 NOTE — Patient Instructions (Addendum)
Your physician recommends that you schedule a follow-up appointment in: ONE YEAR WITH TW  YOUR PHYSICIAN RECOMMENDS YOU START OTC ZANTAC TO ASSIST WITH HEARTBURN  Your physician discussed the importance of regular exercise and recommended that you start or continue a regular exercise program for good health.

## 2012-10-10 NOTE — Assessment & Plan Note (Signed)
Patient has symptoms of reflux and indigestion. I recommend she try over-the-counter Zantac. If this does not help I asked her to discuss this with Dr. Phillips Odor.

## 2012-10-10 NOTE — Assessment & Plan Note (Signed)
Well controlled 

## 2012-10-10 NOTE — Assessment & Plan Note (Signed)
Normal stress Myoview in 2009. No cardiac complaints. Continue risk modification. Recommend weight loss and regular exercise program.

## 2012-10-10 NOTE — Assessment & Plan Note (Signed)
Regular exercise program and weight loss recommended.

## 2012-11-14 ENCOUNTER — Other Ambulatory Visit: Payer: Self-pay | Admitting: Cardiology

## 2013-01-09 ENCOUNTER — Other Ambulatory Visit: Payer: Self-pay | Admitting: Cardiology

## 2013-04-17 ENCOUNTER — Other Ambulatory Visit: Payer: Self-pay | Admitting: Cardiology

## 2013-05-23 ENCOUNTER — Other Ambulatory Visit: Payer: Self-pay | Admitting: Cardiology

## 2013-06-10 ENCOUNTER — Encounter: Payer: Self-pay | Admitting: Cardiology

## 2013-06-10 ENCOUNTER — Ambulatory Visit (INDEPENDENT_AMBULATORY_CARE_PROVIDER_SITE_OTHER): Payer: BC Managed Care – PPO | Admitting: Cardiology

## 2013-06-10 VITALS — BP 148/81 | HR 93 | Ht 64.0 in | Wt 234.0 lb

## 2013-06-10 DIAGNOSIS — E119 Type 2 diabetes mellitus without complications: Secondary | ICD-10-CM

## 2013-06-10 DIAGNOSIS — I2510 Atherosclerotic heart disease of native coronary artery without angina pectoris: Secondary | ICD-10-CM

## 2013-06-10 DIAGNOSIS — I10 Essential (primary) hypertension: Secondary | ICD-10-CM

## 2013-06-10 DIAGNOSIS — E783 Hyperchylomicronemia: Secondary | ICD-10-CM

## 2013-06-10 DIAGNOSIS — E669 Obesity, unspecified: Secondary | ICD-10-CM

## 2013-06-10 MED ORDER — FUROSEMIDE 20 MG PO TABS: 20.0000 mg | ORAL_TABLET | ORAL | Status: DC | PRN

## 2013-06-10 NOTE — Patient Instructions (Addendum)
Your physician recommends that you schedule a follow-up appointment in: ONE YEAR WITH Dr. Purvis Sheffield    Your physician has requested that you regularly monitor and record your blood pressure readings at home. Please use the same machine at the same time of day to check your readings and record them to bring to your follow-up visit.PLEASE MAKE SURE YOUR SYSTOLIC STAYS BELOW 140, CALL THE OFFICE IF THIS CONTINUES FOR CONSECUTIVE DAYS   Your physician has recommended you make the following change in your medication:   1) START TAKING LASIX 20MG  AS NEEDED FOR SWELLING

## 2013-06-10 NOTE — Progress Notes (Signed)
HPI Jordan Perry comes in today for evaluation and management of her subclinical coronary artery disease and multiple cardiac risk factors. She denies any angina or ischemic symptoms. Her blood work is being monitored by Dr. Phillips Odor. She is on aggressive statin therapy, tight blood sugar control but doesn't know her A1c, and watching her blood pressure closely. She has not lost any weight. She was recently treated for cellulitis of the right lower extremity. She has had some residual edema and skin discoloration.  Past Medical History  Diagnosis Date  . Coronary atherosclerosis of unspecified type of vessel, native or graft   . Pure hyperglyceridemia   . Unspecified essential hypertension   . Type II or unspecified type diabetes mellitus without mention of complication, not stated as uncontrolled   . Allergic rhinitis, cause unspecified   . Hypothyroidism   . Proteinuria     Current Outpatient Prescriptions  Medication Sig Dispense Refill  . aspirin 81 MG tablet Take 81 mg by mouth daily.        Marland Kitchen atorvastatin (LIPITOR) 80 MG tablet TAKE 1 TABLET DAILY  90 tablet  2  . insulin aspart protamine-insulin aspart (NOVOLOG 70/30) (70-30) 100 UNIT/ML injection Inject 60 Units into the skin 2 (two) times daily with a meal. Sliding scale       . levothyroxine (SYNTHROID, LEVOTHROID) 75 MCG tablet Take 75 mcg by mouth daily.        Marland Kitchen lisinopril (PRINIVIL,ZESTRIL) 20 MG tablet TAKE 1 TABLET DAILY  90 tablet  1  . metFORMIN (GLUCOPHAGE) 500 MG tablet Take 500 mg by mouth 2 (two) times daily with a meal.        . furosemide (LASIX) 20 MG tablet Take 1 tablet (20 mg total) by mouth as needed.  90 tablet  1   No current facility-administered medications for this visit.    Allergies  Allergen Reactions  . Penicillins     REACTION: rash, edema  . Prednisone     Family History  Problem Relation Age of Onset  . Coronary artery disease    . Diabetes    . Obesity    . Heart disease Father     and  heart problems (unspecified0  . Heart attack Brother 50  . Coronary artery disease Sister   . Hyperlipidemia    . Hypertension    . COPD Mother 48  . Cancer Father 65    History   Social History  . Marital Status: Married    Spouse Name: N/A    Number of Children: 0  . Years of Education: N/A   Occupational History  . 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 Smoking status: Never Smoker   . Smokeless tobacco: Never Used  . Alcohol Use: No  . Drug Use: No  . Sexual Activity: Not on file   Other Topics Concern  . Not on file   Social History Narrative  . No narrative on file    ROS ALL NEGATIVE EXCEPT THOSE NOTED IN HPI  PE  General Appearance: well developed, well nourished in no acute distress, obese HEENT: symmetrical face, PERRLA, good dentition  Neck: no JVD, thyromegaly, or adenopathy, trachea midline Chest: symmetric without deformity Cardiac: PMI non-displaced, RRR, normal S1, S2, no gallop or murmur Lung: clear to ausculation and percussion Vascular: all pulses full without bruits  Abdominal: nondistended, nontender, good bowel sounds, no HSM, no bruits Extremities: no cyanosis, clubbing ,  skin color changes consistent with old cellulitis of both lower extremities no sign of DVT, no varicosities  Skin: normal color, no rashes Neuro: alert and oriented x 3, non-focal Pysch: normal affect  EKG Normal sinus rhythm, normal EKG BMET    Component Value Date/Time   NA 139 12/10/2010 1748   K 4.2 12/10/2010 1748   CL 104 12/10/2010 1748   CO2 23 12/10/2010 1748   GLUCOSE 122* 12/10/2010 1748   BUN 10 12/10/2010 1748   CREATININE 0.48 12/10/2010 1748   CALCIUM 9.0 12/10/2010 1748   GFRNONAA 138.41 07/09/2009 1221   GFRAA 169 07/19/2007 0839    Lipid Panel     Component Value Date/Time   CHOL 117 12/10/2010 1748   TRIG 126 12/10/2010 1748   HDL 40 12/10/2010 1748   CHOLHDL 2.9 Ratio 12/10/2010 1748   VLDL 25 12/10/2010  1748   LDLCALC 52 12/10/2010 1748    CBC No results found for this basename: wbc, rbc, hgb, hct, plt, mcv, mch, mchc, rdw, neutrabs, lymphsabs, monoabs, eosabs, basosabs

## 2013-06-10 NOTE — Assessment & Plan Note (Signed)
This is subclinical and she remains asymptomatic. Continue aggressive secondary preventative strategies. Return to the office in one year with Dr.Koneswaren.

## 2013-06-10 NOTE — Assessment & Plan Note (Signed)
I am a little concerned about her systolic blood pressure. We'll not make changes today but it made it clear that her goal is less than 140 systolic. She will let us know if it's not. At that time we would go on her lisinopril to 40 mg a day.

## 2013-08-29 ENCOUNTER — Other Ambulatory Visit: Payer: Self-pay

## 2013-10-27 ENCOUNTER — Other Ambulatory Visit: Payer: Self-pay | Admitting: Cardiology

## 2013-11-14 ENCOUNTER — Other Ambulatory Visit: Payer: Self-pay | Admitting: Cardiology

## 2013-12-21 ENCOUNTER — Other Ambulatory Visit: Payer: Self-pay | Admitting: Cardiology

## 2014-01-27 ENCOUNTER — Other Ambulatory Visit: Payer: Self-pay | Admitting: Cardiovascular Disease

## 2014-02-20 ENCOUNTER — Ambulatory Visit: Payer: BC Managed Care – PPO | Admitting: Orthopedic Surgery

## 2014-08-08 ENCOUNTER — Other Ambulatory Visit: Payer: Self-pay

## 2014-08-28 ENCOUNTER — Encounter: Payer: Self-pay | Admitting: Cardiovascular Disease

## 2014-08-28 ENCOUNTER — Ambulatory Visit (INDEPENDENT_AMBULATORY_CARE_PROVIDER_SITE_OTHER): Payer: BC Managed Care – PPO | Admitting: Cardiovascular Disease

## 2014-08-28 VITALS — BP 128/82 | HR 78 | Ht 63.0 in | Wt 237.0 lb

## 2014-08-28 DIAGNOSIS — Z719 Counseling, unspecified: Secondary | ICD-10-CM

## 2014-08-28 DIAGNOSIS — Z7182 Exercise counseling: Secondary | ICD-10-CM

## 2014-08-28 DIAGNOSIS — E119 Type 2 diabetes mellitus without complications: Secondary | ICD-10-CM

## 2014-08-28 DIAGNOSIS — E783 Hyperchylomicronemia: Secondary | ICD-10-CM

## 2014-08-28 DIAGNOSIS — E669 Obesity, unspecified: Secondary | ICD-10-CM

## 2014-08-28 DIAGNOSIS — Z8249 Family history of ischemic heart disease and other diseases of the circulatory system: Secondary | ICD-10-CM

## 2014-08-28 DIAGNOSIS — I10 Essential (primary) hypertension: Secondary | ICD-10-CM

## 2014-08-28 NOTE — Patient Instructions (Addendum)
Your physician wants you to follow-up in: 1 year You will receive a reminder letter in the mail two months in advance. If you don't receive a letter, please call our office to schedule the follow-up appointment.    Your physician recommends that you continue on your current medications as directed. Please refer to the Current Medication list given to you today.     Thank you for choosing [facility] !  

## 2014-08-28 NOTE — Progress Notes (Signed)
Patient ID: Jordan Perry, female   DOB: January 15, 1956, 58 y.o.   MRN: [id number]      SUBJECTIVE: The patient is a 58 year old woman with a family history of premature coronary artery disease and has a personal history of hypertension, hyperlipidemia, and type 2 diabetes mellitus. She herself does not have any established coronary artery disease. She denies anginal symptoms today, and also denies shortness of breath, orthopnea, paroxysmal nocturnal dyspnea, and leg swelling. She likes to walk. She works on [employer] at [employer]. She has worked there for 21 years. ECG performed in the office today demonstrates normal sinus rhythm with no ischemic ST segment or T-wave abnormalities.  Review of Systems: As per "subjective", otherwise negative.  Allergies  Allergen Reactions  . Penicillins     REACTION: rash, edema  . Prednisone     Current Outpatient Prescriptions  Medication Sig Dispense Refill  . aspirin 81 MG tablet Take 81 mg by mouth daily.      Marland Kitchen atorvastatin (LIPITOR) 80 MG tablet TAKE 1 TABLET DAILY 90 tablet 3  . furosemide (LASIX) 20 MG tablet TAKE 1 TABLET (20 MG TOTAL) AS NEEDED 90 tablet 3  . insulin aspart protamine-insulin aspart (NOVOLOG 70/30) (70-30) 100 UNIT/ML injection Inject 60 Units into the skin 2 (two) times daily with a meal. Sliding scale     . Iron-Vitamins (GERITOL PO) Take by mouth daily.    Marland Kitchen levothyroxine (SYNTHROID, LEVOTHROID) 88 MCG tablet Take 88 mcg by mouth daily before breakfast.    . lisinopril (PRINIVIL,ZESTRIL) 20 MG tablet TAKE 1 TABLET DAILY 90 tablet 2  . metFORMIN (GLUCOPHAGE) 500 MG tablet Take 500 mg by mouth 2 (two) times daily with a meal.      . ranitidine (ZANTAC) 150 MG tablet Take 150 mg by mouth daily.     No current facility-administered medications for this visit.    Past Medical History  Diagnosis Date  . Coronary atherosclerosis of unspecified type of vessel, native or graft   . Pure  hyperglyceridemia   . Unspecified essential hypertension   . Type II or unspecified type diabetes mellitus without mention of complication, not stated as uncontrolled   . Allergic rhinitis, cause unspecified   . Hypothyroidism   . Proteinuria     Past Surgical History  Procedure Laterality Date  . Refractive surgery  1994  . Cystectomy  1989    removed from end of spine    History   Social History  . Marital Status: Married    Spouse Name: N/A    Number of Children: 0  . 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 Smoking status: Never Smoker   . Smokeless tobacco: Never Used  . Alcohol Use: No  . Drug Use: No  . Sexual Activity: Not on file   Other Topics Concern  . Not on file   Social History Narrative     Filed Vitals:   08/28/14 0807  BP: 128/82  Pulse: 78  Height: 5\' 3"  (1.6 m)  Weight: 237 lb (107.502 kg)    PHYSICAL EXAM General: NAD HEENT: Normal. Neck: No JVD, no thyromegaly. Lungs: Clear to auscultation bilaterally with normal respiratory effort. CV: Nondisplaced PMI.  Regular rate and rhythm, normal S1/S2, no S3/S4, no murmur. No pretibial or periankle edema.  No carotid bruit.  Normal pedal pulses.  Abdomen: Soft, nontender, obese, no distention.  Neurologic: Alert and oriented x 3.  Psych: Normal affect. Skin: Normal. Musculoskeletal: Normal range of motion, no gross deformities. Extremities: No clubbing or cyanosis.   ECG: Most recent ECG reviewed.      ASSESSMENT AND PLAN: 1. Essential HTN: well-controlled today on lisinopril 20 mg daily. No changes required. 2. Hyperlipidemia: She takes Lipitor 80 mg daily and her lipids are managed both by her diabetic specialist and her primary care physician. 3. Type 2 diabetes: She takes insulin and metformin and sees a diabetic specialist in [place]. 4. Exercise counseling was provided, and walking was  encouraged.  Dispo: f/u 1 year.  Kate Sable, M.D., F.A.C.C.

## 2014-10-03 ENCOUNTER — Other Ambulatory Visit: Payer: Self-pay | Admitting: Cardiovascular Disease

## 2014-10-20 ENCOUNTER — Other Ambulatory Visit: Payer: Self-pay | Admitting: Physician Assistant

## 2014-10-21 ENCOUNTER — Other Ambulatory Visit: Payer: Self-pay

## 2014-11-25 ENCOUNTER — Other Ambulatory Visit: Payer: Self-pay | Admitting: Cardiovascular Disease

## 2015-04-20 ENCOUNTER — Other Ambulatory Visit: Payer: Self-pay

## 2015-04-30 ENCOUNTER — Ambulatory Visit (INDEPENDENT_AMBULATORY_CARE_PROVIDER_SITE_OTHER): Payer: BLUE CROSS/BLUE SHIELD

## 2015-04-30 ENCOUNTER — Encounter: Payer: Self-pay | Admitting: Orthopedic Surgery

## 2015-04-30 ENCOUNTER — Ambulatory Visit (INDEPENDENT_AMBULATORY_CARE_PROVIDER_SITE_OTHER): Payer: BLUE CROSS/BLUE SHIELD | Admitting: Orthopedic Surgery

## 2015-04-30 VITALS — BP 140/76 | Ht 63.0 in | Wt 233.6 lb

## 2015-04-30 DIAGNOSIS — M25511 Pain in right shoulder: Secondary | ICD-10-CM

## 2015-04-30 DIAGNOSIS — M7581 Other shoulder lesions, right shoulder: Secondary | ICD-10-CM | POA: Diagnosis not present

## 2015-04-30 NOTE — Progress Notes (Signed)
Patient ID: Jordan Perry, female   DOB: [birth year], 57 y.o.   MRN: [id number] Patient ID: Jordan Perry, female   DOB: [birth year], 59 y.o.   MRN: [id number]  NEW  Chief Complaint  Patient presents with  . Shoulder Pain    right shoulder pain, no known injury     Jordan Perry is a 59 y.o. female.   HPI 59 year old female presents with a one-year history of right shoulder pain described as a sharp aching sensation which is constant and rates it 8 out of 10 exacerbated by forward elevation is not having any association with any trauma and there is been no treatment  System review seasonal allergies the other 13 systems were reviewed by the patient reported as normal Review of Systems See hpi  Past Medical History  Diagnosis Date  . Coronary atherosclerosis of unspecified type of vessel, native or graft   . Pure hyperglyceridemia   . Unspecified essential hypertension   . Type II or unspecified type diabetes mellitus without mention of complication, not stated as uncontrolled   . Allergic rhinitis, cause unspecified   . Hypothyroidism   . Proteinuria     Past Surgical History  Procedure Laterality Date  . Refractive surgery  1994  . Cystectomy  1989    removed from end of spine    Family History  Problem Relation Age of Onset  . Coronary artery disease    . Diabetes    . Obesity    . Heart disease Father     and heart problems (unspecified0  . Heart attack Brother 67  . Coronary artery disease Sister   . Hyperlipidemia    . Hypertension    . COPD Mother 62  . Cancer Father 40    Social History History  Substance Use Topics  . Smoking status: Never Smoker   . Smokeless tobacco: Never Used  . Alcohol Use: No    Allergies  Allergen Reactions  . Penicillins     REACTION: rash, edema  . Prednisone     Current Outpatient Prescriptions  Medication Sig Dispense Refill  . aspirin 81 MG tablet Take 81 mg by mouth daily.      Marland Kitchen atorvastatin (LIPITOR) 80  MG tablet TAKE 1 TABLET DAILY 90 tablet 2  . furosemide (LASIX) 20 MG tablet TAKE 1 TABLET AS NEEDED 90 tablet 2  . insulin lispro protamine-lispro (HUMALOG 75/25 MIX) (75-25) 100 UNIT/ML SUSP injection Inject 80 Units into the skin 2 (two) times daily with a meal.    . Iron-Vitamins (GERITOL PO) Take by mouth daily.    Marland Kitchen levothyroxine (SYNTHROID, LEVOTHROID) 100 MCG tablet Take 100 mcg by mouth daily before breakfast.    . lisinopril (PRINIVIL,ZESTRIL) 20 MG tablet TAKE 1 TABLET DAILY 90 tablet 3  . Multiple Vitamins-Minerals (HAIR/SKIN/NAILS PO) Take by mouth.    . ranitidine (ZANTAC) 150 MG tablet Take 150 mg by mouth daily.    . sitaGLIPtin-metformin (JANUMET) 50-1000 MG per tablet Take 1 tablet by mouth 2 (two) times daily with a meal.     No current facility-administered medications for this visit.       Physical Exam Blood pressure 140/76, height 5\' 3"  (1.6 m), weight 233 lb 9.6 oz (105.96 kg). Physical Exam The patient is well developed well nourished and well groomed. Orientation to person place and time is normal  Mood is pleasant. Ambulatory status normal no assisted device Cervical spine no tenderness normal range of motion Left shoulder  full forward elevation normal strength and normal stability  Right shoulder tenderness trapezius lateral deltoid. Painful range of motion at 120 positive impingement no instability rotator cuff strength normal  Skin normal. No sensory deficits. Good pulse normal lymph nodes in the right arm and axilla  Data Reviewed My x-rays in the office show chronic rotator cuff disease changes in the supraspinatus insertion greater tuberosity and a type II if not 3 acromion  Assessment Rotator cuff syndrome right shoulder Plan Injection Physical therapy Aleve twice a day for one month Follow up as needed   Procedure note the subacromial injection shoulder RIGHT  Verbal consent was obtained to inject the  RIGHT   Shoulder  Timeout was  completed to confirm the injection site is a subacromial space of the  RIGHT  shoulder   Medication used Depo-Medrol 40 mg and lidocaine 1% 3 cc  Anesthesia was provided by ethyl chloride  The injection was performed in the RIGHT  posterior subacromial space. After pinning the skin with alcohol and anesthetized the skin with ethyl chloride the subacromial space was injected using a 20-gauge needle. There were no complications  Sterile dressing was applied.

## 2015-04-30 NOTE — Patient Instructions (Signed)
Call [facility] therapy dept to schedule therapy visits  Aleve twice daily for 1 month  Joint Injection Care After Refer to this sheet in the next few days. These instructions provide you with information on caring for yourself after you have had a joint injection. Your caregiver also may give you more specific instructions. Your treatment has been planned according to current medical practices, but problems sometimes occur. Call your caregiver if you have any problems or questions after your procedure. After any type of joint injection, it is not uncommon to experience:  Soreness, swelling, or bruising around the injection site.  Mild numbness, tingling, or weakness around the injection site caused by the numbing medicine used before or with the injection. It also is possible to experience the following effects associated with the specific agent after injection:  Iodine-based contrast agents:  Allergic reaction (itching, hives, widespread redness, and swelling beyond the injection site).  Corticosteroids (These effects are rare.):  Allergic reaction.  Increased blood sugar levels (If you have diabetes and you notice that your blood sugar levels have increased, notify your caregiver).  Increased blood pressure levels.  Mood swings.  Hyaluronic acid in the use of viscosupplementation.  Temporary heat or redness.  Temporary rash and itching.  Increased fluid accumulation in the injected joint. These effects all should resolve within a day after your procedure.  HOME CARE INSTRUCTIONS  Limit yourself to light activity the day of your procedure. Avoid lifting heavy objects, bending, stooping, or twisting.  Take prescription or over-the-counter pain medication as directed by your caregiver.  You may apply ice to your injection site to reduce pain and swelling the day of your procedure. Ice may be applied 03-04 times:  Put ice in a plastic bag.  Place a towel between your skin and  the bag.  Leave the ice on for no longer than 15-20 minutes each time. SEEK IMMEDIATE MEDICAL CARE IF:   Pain and swelling get worse rather than better or extend beyond the injection site.  Numbness does not go away.  Blood or fluid continues to leak from the injection site.  You have chest pain.  You have swelling of your face or tongue.  You have trouble breathing or you become dizzy.  You develop a fever, chills, or severe tenderness at the injection site that last longer than 1 day. MAKE SURE YOU:  Understand these instructions.  Watch your condition.  Get help right away if you are not doing well or if you get worse. Document Released: 06/23/2011 Document Revised: 01/02/2012 Document Reviewed: 06/23/2011 [facility] Patient Information 2015 [place], [place]. This information is not intended to replace advice given to you by your health care provider. Make sure you discuss any questions you have with your health care provider.

## 2015-05-01 ENCOUNTER — Ambulatory Visit (HOSPITAL_COMMUNITY): Payer: BLUE CROSS/BLUE SHIELD | Attending: Orthopedic Surgery

## 2015-05-01 ENCOUNTER — Encounter (HOSPITAL_COMMUNITY): Payer: Self-pay

## 2015-05-01 DIAGNOSIS — M6289 Other specified disorders of muscle: Secondary | ICD-10-CM

## 2015-05-01 DIAGNOSIS — M25611 Stiffness of right shoulder, not elsewhere classified: Secondary | ICD-10-CM

## 2015-05-01 DIAGNOSIS — R29898 Other symptoms and signs involving the musculoskeletal system: Secondary | ICD-10-CM | POA: Diagnosis not present

## 2015-05-01 DIAGNOSIS — M778 Other enthesopathies, not elsewhere classified: Secondary | ICD-10-CM

## 2015-05-01 DIAGNOSIS — M7581 Other shoulder lesions, right shoulder: Secondary | ICD-10-CM

## 2015-05-01 DIAGNOSIS — M629 Disorder of muscle, unspecified: Secondary | ICD-10-CM | POA: Insufficient documentation

## 2015-05-01 DIAGNOSIS — M25511 Pain in right shoulder: Secondary | ICD-10-CM | POA: Diagnosis present

## 2015-05-01 NOTE — Therapy (Signed)
[place], [place], [address] Phone: [phone]   Fax:  [phone]  Occupational Therapy Evaluation  Patient Details  Name: Jordan Perry MRN: [id number] Date of Birth: [birth year] Referring Provider:  Carole Civil, MD  Encounter Date: 05/01/2015      OT End of Session - 05/01/15 1501    Visit Number 1   Number of Visits 8   Date for OT Re-Evaluation 06/30/15  mini reassessment: 05/29/15   Authorization Type BCBS - no visit limit   OT Start Time 1400   OT Stop Time 1433   OT Time Calculation (min) 33 min   Activity Tolerance Patient tolerated treatment well   Behavior During Therapy [facility] for tasks assessed/performed      Past Medical History  Diagnosis Date  . Coronary atherosclerosis of unspecified type of vessel, native or graft   . Pure hyperglyceridemia   . Unspecified essential hypertension   . Type II or unspecified type diabetes mellitus without mention of complication, not stated as uncontrolled   . Allergic rhinitis, cause unspecified   . Hypothyroidism   . Proteinuria     Past Surgical History  Procedure Laterality Date  . Refractive surgery  1994  . Cystectomy  1989    removed from end of spine    There were no vitals filed for this visit.  Visit Diagnosis:  Tendonitis of shoulder, right - Plan: Ot plan of care cert/re-cert  Pain in joint, shoulder region, right - Plan: Ot plan of care cert/re-cert  Tight fascia - Plan: Ot plan of care cert/re-cert  Stiffness of joint, shoulder region, right - Plan: Ot plan of care cert/re-cert  Shoulder weakness - Plan: Ot plan of care cert/re-cert      Subjective Assessment - 05/01/15 1457    Subjective  S: I just want my arm to feel better.    Pertinent History Patient is a 59 y/o female s/p right rotator cuff tendonitis with no known injury. Pt states she has been experiencing pain for approx. 1 year and it recently became worse.  Patient received a cortizone shot on 04/30/15 which has brought the pain down from a 10/10 to 8/10. Dr. Aline Brochure has referred patient to occupational therapy for evaluation and treatment.    Special Tests FOTO score: 43/100   Patient Stated Goals To get better.   Currently in Pain? Yes   Pain Score 8    Pain Location Shoulder   Pain Orientation Right   Pain Descriptors / Indicators Sore   Pain Type Chronic pain   Pain Frequency Constant           [facility] OT Assessment - 05/01/15 1333    Assessment   Diagnosis right rotator cuff tendonitis   Onset Date --  About a year ago.   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 PRN with [employer] for same job.     ADL   ADL comments Difficulty with any movement with RUE, lifiting heavy items, reaching  overhead.    Written Expression   Dominant Hand Right   Vision - History   Baseline Vision No visual deficits   Cognition   Overall Cognitive Status Within Functional Limits for tasks assessed   ROM / Strength   AROM / PROM / Strength AROM;PROM;Strength   Palpation   Palpation comment Max fascial restrictions in right upper arm/deltoid region.   AROM   Overall AROM Comments Assessed seated. IR/ER adducted.   AROM Assessment Site Shoulder   Right/Left Shoulder Right   Right Shoulder Flexion 130 Degrees   Right Shoulder ABduction 130 Degrees   Right Shoulder Internal Rotation 90 Degrees   Right Shoulder External Rotation 60 Degrees   PROM   Overall PROM Comments Assessed seated. IR/ER adducted.   PROM Assessment Site  Shoulder   Right/Left Shoulder Right   Right Shoulder Flexion 135 Degrees   Right Shoulder ABduction 150 Degrees   Right Shoulder Internal Rotation 90 Degrees   Right Shoulder External Rotation 70 Degrees   Strength   Overall Strength Comments Assessed seated. IR/ER adducted.   Strength Assessment Site Shoulder   Right/Left Shoulder Right   Right Shoulder Flexion 4-/5   Right Shoulder ABduction 4-/5   Right Shoulder Internal Rotation 3+/5   Right Shoulder External Rotation 4-/5                         OT Education - 05/01/15 1501    Education provided Yes   Education Details Shoulder stretches   Person(s) Educated Patient   Methods Explanation;Demonstration;Handout   Comprehension Returned demonstration;Verbalized understanding          OT Short Term Goals - 05/01/15 1505    OT SHORT TERM GOAL #1   Title Patient will be educated and independent with HEP.   Time 4   Period Weeks   Status New   OT SHORT TERM GOAL #2   Title Patient will return to highest level of independence with all daily, work, and leisure tasks.    Time 4   Period Weeks   Status New   OT SHORT TERM GOAL #3   Title Patient will decrease pain to 3/10 or less with daily and work tasks.    Time 4   Period Weeks   Status New   OT SHORT TERM GOAL #4   Title Patient will increase AROM to WNL to increase ability to reach into overhead cabinets.    Time 4   Period Weeks   Status New   OT SHORT TERM GOAL #5   Title Patient will increase RUE strength to 4+/5 to increase ability to complete work related tasks such as carrying heavy charts with less difficulty.    Time 4   Period Weeks   Additional Short Term Goals   Additional Short Term Goals Yes   OT SHORT TERM GOAL #6   Title Patient will decrease fascial restrictions from Max to [place] amount.    Time 4   Period Weeks   Status New                  Plan - 05/01/15 1502    Clinical Impression Statement A: Patient is a 59  y/o female s/p right rotator cuff tendonitis causing increased pain and fascial restrctions and decreased strength and range of motion resulting in difficulty completing daily, work, and leisure tasks.    Pt will benefit from skilled therapeutic intervention in order to improve on  the following deficits (Retired) Decreased range of motion;Increased fascial restricitons;Pain;Decreased strength   Rehab Potential Excellent   OT Frequency 2x / week   OT Duration 4 weeks   OT Treatment/Interventions Self-care/ADL training;Therapeutic exercise;Patient/family education;Manual Therapy;Ultrasound;Therapeutic activities;DME and/or AE instruction;Cryotherapy;Electrical Stimulation;Passive range of motion;Moist Heat   Plan P: Pt will benefit from skilled OT services to increase functional performance during daily, work, and leisure tasks when using RUE as dominant extremity. Treatment Plan: Myofascial release, muscle energy technique, Passive stretching, AAROM, AROM, general strengthening, and shoulder stability exercises.    Consulted and Agree with Plan of Care Patient        Problem List Patient Active Problem List   Diagnosis Date Noted  . GERD (gastroesophageal reflux disease) 10/10/2012  . Coronary artery disease 10/06/2011  . Obesity 10/06/2011  . HYPERLIPIDEMIA TYPE I / IV 06/26/2009  . LEG PAIN 06/26/2009  . DIABETES MELLITUS, TYPE II 06/22/2009  . HYPERTENSION, UNSPECIFIED 06/22/2009  . ALLERGIC RHINITIS 06/22/2009     Ailene Ravel, OTR/L,CBIS  [phone]  05/01/2015, 3:10 PM  [facility] [address] [place], [place], [address] Phone: [phone]   Fax:  [phone]

## 2015-05-01 NOTE — Patient Instructions (Signed)
  Flexibility: Corner Stretch   Standing in corner with hands just above shoulder level, lean forward until a comfortable stretch is felt across chest. Hold _10___ seconds. Repeat __3__ times per set. Do __1__ sets per session. Do __2-3__ sessions per day.  http://orth.exer.us/342   Copyright  VHI. All rights reserved.   Scapular Retraction (Standing)   With arms at sides, pinch shoulder blades together. Repeat _10___ times per set. Do _1___ sets per session. Do _2-3___ sessions per day.  http://orth.exer.us/944   Copyright  VHI. All rights reserved.   ROM: Towel Stretch - with Interior Rotation   Pull left arm up behind back by pulling towel up with other arm. Hold _10___ seconds. Repeat __3__ times per set. Do _1___ sets per session. Do _2-3___ sessions per day.  http://orth.exer.us/888   Copyright  VHI. All rights reserved.   Posterior Capsule Stretch   Stand or sit, one arm across body so hand rests over opposite shoulder. Gently push on crossed elbow with other hand until stretch is felt in shoulder of crossed arm. Hold _10__ seconds.  Repeat _3__ times per session. Do 2-3___ sessions per day.  Copyright  VHI. All rights reserved.   Flexors Stretch, Standing   Stand near wall and slide arm up, with palm facing away from wall, by leaning toward wall. Hold _10__ seconds.  Repeat _3__ times per session. Do _2-3__ sessions per day.  Copyright  VHI. All rights reserved.

## 2015-05-05 ENCOUNTER — Ambulatory Visit (HOSPITAL_COMMUNITY): Payer: BLUE CROSS/BLUE SHIELD | Admitting: Occupational Therapy

## 2015-05-05 ENCOUNTER — Encounter (HOSPITAL_COMMUNITY): Payer: Self-pay | Admitting: Occupational Therapy

## 2015-05-05 DIAGNOSIS — M6289 Other specified disorders of muscle: Secondary | ICD-10-CM

## 2015-05-05 DIAGNOSIS — M25511 Pain in right shoulder: Secondary | ICD-10-CM | POA: Diagnosis not present

## 2015-05-05 DIAGNOSIS — M629 Disorder of muscle, unspecified: Secondary | ICD-10-CM

## 2015-05-05 DIAGNOSIS — M25611 Stiffness of right shoulder, not elsewhere classified: Secondary | ICD-10-CM

## 2015-05-05 DIAGNOSIS — R29898 Other symptoms and signs involving the musculoskeletal system: Secondary | ICD-10-CM

## 2015-05-05 NOTE — Therapy (Signed)
[place], [place], [address] Phone: [phone]   Fax:  [phone]  Occupational Therapy Treatment  Patient Details  Name: [place] Perry MRN: [id number] Date of Birth: Dec 19, 1955 Referring Provider:  Carole Civil, MD  Encounter Date: 05/05/2015      OT End of Session - 05/05/15 1151    Visit Number 2   Number of Visits 8   Date for OT Re-Evaluation 06/30/15  mini reassessment: 05/29/15   Authorization Type BCBS - no visit limit   OT Start Time 0943   OT Stop Time 1015   OT Time Calculation (min) 32 min   Activity Tolerance Patient tolerated treatment well   Behavior During Therapy [facility] for tasks assessed/performed      Past Medical History  Diagnosis Date  . Coronary atherosclerosis of unspecified type of vessel, native or graft   . Pure hyperglyceridemia   . Unspecified essential hypertension   . Type II or unspecified type diabetes mellitus without mention of complication, not stated as uncontrolled   . Allergic rhinitis, cause unspecified   . Hypothyroidism   . Proteinuria     Past Surgical History  Procedure Laterality Date  . Refractive surgery  1994  . Cystectomy  1989    removed from end of spine    There were no vitals filed for this visit.  Visit Diagnosis:  Pain in joint, shoulder region, right  Tight fascia  Stiffness of joint, shoulder region, right  Shoulder weakness      Subjective Assessment - 05/05/15 0945    Subjective  S: It's feeling a little better, I've been doing my exercises.    Currently in Pain? No/denies            [facility] OT Assessment - 05/05/15 1004    Assessment   Diagnosis right rotator cuff tendonitis   Precautions   Precautions None                  OT Treatments/Exercises (OP) - 05/05/15 0946    Exercises   Exercises Shoulder   Shoulder Exercises: Supine   Protraction PROM;5 reps;AAROM;10 reps   Horizontal ABduction  PROM;5 reps;AAROM;10 reps   External Rotation PROM;5 reps;AAROM;10 reps   Internal Rotation PROM;5 reps;AAROM;10 reps   Flexion PROM;5 reps;AAROM;10 reps   ABduction PROM;5 reps;AAROM;10 reps   Shoulder Exercises: Seated   Extension Theraband;10 reps   Theraband Level (Shoulder Extension) Level 2 (Red)   Retraction Theraband;10 reps   Theraband Level (Shoulder Retraction) Level 2 (Red)   Row Theraband;10 reps   Manual Therapy   Manual Therapy Myofascial release   Myofascial Release Myofascial release to right anterior and medial deltoid, trapezuis, & scapularis regions to decrease pain and fascial restrictions and increase joint range of motion.                   OT Short Term Goals - 05/05/15 1153    OT SHORT TERM GOAL #1   Title Patient will be educated and independent with HEP.   Time 4   Period Weeks   Status On-going   OT SHORT TERM GOAL #2   Title Patient will return to highest level of independence with all daily, work, and leisure tasks.    Time 4   Period Weeks   Status On-going   OT SHORT TERM GOAL #3   Title Patient will decrease pain to 3/10 or less with daily and work tasks.  Time 4   Period Weeks   Status On-going   OT SHORT TERM GOAL #4   Title Patient will increase AROM to WNL to increase ability to reach into overhead cabinets.    Time 4   Period Weeks   Status On-going   OT SHORT TERM GOAL #5   Title Patient will increase RUE strength to 4+/5 to increase ability to complete work related tasks such as carrying heavy charts with less difficulty.    Time 4   Period Weeks   Status On-going   OT SHORT TERM GOAL #6   Title Patient will decrease fascial restrictions from Max to [place] amount.    Time 4   Period Weeks   Status On-going                  Plan - 05/05/15 1152    Clinical Impression Statement A: Initiated myofascial release, PROM, AAROM exercises and scapular theraband exercises this session. Pt reports the stretches  provided at evaluation have been helping and she can reach around to her back now. Pt tolerated treatment well.    Plan P: Provide copy of evaluation. Add AAROM seated, proximal shoulder strengthening supine.         Problem List Patient Active Problem List   Diagnosis Date Noted  . GERD (gastroesophageal reflux disease) 10/10/2012  . Coronary artery disease 10/06/2011  . Obesity 10/06/2011  . HYPERLIPIDEMIA TYPE I / IV 06/26/2009  . LEG PAIN 06/26/2009  . DIABETES MELLITUS, TYPE II 06/22/2009  . HYPERTENSION, UNSPECIFIED 06/22/2009  . ALLERGIC RHINITIS 06/22/2009    Guadelupe Sabin, OTR/L  [phone]  05/05/2015, 11:54 AM  [place], [place], [address] Phone: [phone]   Fax:  [phone]

## 2015-05-06 ENCOUNTER — Ambulatory Visit (HOSPITAL_COMMUNITY): Payer: BLUE CROSS/BLUE SHIELD

## 2015-05-06 ENCOUNTER — Encounter (HOSPITAL_COMMUNITY): Payer: Self-pay

## 2015-05-06 DIAGNOSIS — M629 Disorder of muscle, unspecified: Secondary | ICD-10-CM

## 2015-05-06 DIAGNOSIS — R29898 Other symptoms and signs involving the musculoskeletal system: Secondary | ICD-10-CM

## 2015-05-06 DIAGNOSIS — M25511 Pain in right shoulder: Secondary | ICD-10-CM | POA: Diagnosis not present

## 2015-05-06 DIAGNOSIS — M6289 Other specified disorders of muscle: Secondary | ICD-10-CM

## 2015-05-06 DIAGNOSIS — M25611 Stiffness of right shoulder, not elsewhere classified: Secondary | ICD-10-CM

## 2015-05-06 NOTE — Therapy (Signed)
[place], [place], [address] Phone: [phone]   Fax:  [phone]  Occupational Therapy Treatment  Patient Details  Name: [place] MRN: [id number] Date of Birth: 08-Aug-1956 Referring Provider:  Carole Civil, MD  Encounter Date: 05/06/2015      OT End of Session - 05/06/15 0848    Visit Number 3   Number of Visits 8   Date for OT Re-Evaluation 06/30/15  mini reassessment: 05/29/15   Authorization Type BCBS - no visit limit   OT Start Time 0800   OT Stop Time 0846   OT Time Calculation (min) 46 min   Activity Tolerance Patient tolerated treatment well   Behavior During Therapy [facility] for tasks assessed/performed      Past Medical History  Diagnosis Date  . Coronary atherosclerosis of unspecified type of vessel, native or graft   . Pure hyperglyceridemia   . Unspecified essential hypertension   . Type II or unspecified type diabetes mellitus without mention of complication, not stated as uncontrolled   . Allergic rhinitis, cause unspecified   . Hypothyroidism   . Proteinuria     Past Surgical History  Procedure Laterality Date  . Refractive surgery  1994  . Cystectomy  1989    removed from end of spine    There were no vitals filed for this visit.  Visit Diagnosis:  Tight fascia  Stiffness of joint, shoulder region, right  Shoulder weakness      Subjective Assessment - 05/06/15 0817    Subjective  S: It feels real good today.   Currently in Pain? No/denies            [facility] OT Assessment - 05/06/15 0817    Assessment   Diagnosis right rotator cuff tendonitis   Precautions   Precautions None                  OT Treatments/Exercises (OP) - 05/06/15 0817    Exercises   Exercises Shoulder   Shoulder Exercises: Supine   Protraction PROM;5 reps;AROM;12 reps   Horizontal ABduction PROM;5 reps;AROM;12 reps   External Rotation PROM;5 reps;AROM;12 reps   Internal  Rotation PROM;5 reps;AROM;12 reps   Flexion PROM;5 reps;AROM;12 reps   ABduction PROM;5 reps;AROM;12 reps   Shoulder Exercises: Standing   Protraction AROM;12 reps   Horizontal ABduction AROM;12 reps   External Rotation AROM;12 reps   Internal Rotation AROM;12 reps   Flexion AROM;12 reps   ABduction AROM;12 reps   Extension Theraband;12 reps   Theraband Level (Shoulder Extension) Level 2 (Red)   Row Theraband;12 reps   Theraband Level (Shoulder Row) Level 2 (Red)   Retraction Theraband;12 reps   Theraband Level (Shoulder Retraction) Level 2 (Red)   Shoulder Exercises: ROM/Strengthening   UBE (Upper Arm Bike) Level 1 2' forward 2' reverse    "W" Arms 12X   X to V Arms 12X   Proximal Shoulder Strengthening, Supine 12X   Proximal Shoulder Strengthening, Seated 12X                OT Education - 05/06/15 0835    Education provided Yes          OT Short Term Goals - 05/05/15 1153    OT SHORT TERM GOAL #1   Title Patient will be educated and independent with HEP.   Time 4   Period Weeks   Status On-going   OT SHORT TERM GOAL #2   Title  Patient will return to highest level of independence with all daily, work, and leisure tasks.    Time 4   Period Weeks   Status On-going   OT SHORT TERM GOAL #3   Title Patient will decrease pain to 3/10 or less with daily and work tasks.    Time 4   Period Weeks   Status On-going   OT SHORT TERM GOAL #4   Title Patient will increase AROM to WNL to increase ability to reach into overhead cabinets.    Time 4   Period Weeks   Status On-going   OT SHORT TERM GOAL #5   Title Patient will increase RUE strength to 4+/5 to increase ability to complete work related tasks such as carrying heavy charts with less difficulty.    Time 4   Period Weeks   Status On-going   OT SHORT TERM GOAL #6   Title Patient will decrease fascial restrictions from Max to [place] amount.    Time 4   Period Weeks   Status On-going                   Plan - 05/06/15 0848    Clinical Impression Statement A: Progressed to AROM supine and standing. Added proximal shoulder strengthening, X to V arm, W arms, and UBE bike. Patient tolerated well. Patient was given AROM exercises to complete at home for HEP. Patient overall has great passive ROM although limited slightly. Pain verablized at end of stretch.    Plan P: Follow up on AROM exercises given for HEP. Attempt ball on the wall if able to tolerate.         Problem List Patient Active Problem List   Diagnosis Date Noted  . GERD (gastroesophageal reflux disease) 10/10/2012  . Coronary artery disease 10/06/2011  . Obesity 10/06/2011  . HYPERLIPIDEMIA TYPE I / IV 06/26/2009  . LEG PAIN 06/26/2009  . DIABETES MELLITUS, TYPE II 06/22/2009  . HYPERTENSION, UNSPECIFIED 06/22/2009  . ALLERGIC RHINITIS 06/22/2009    Ailene Ravel, OTR/L,CBIS  [phone]  05/06/2015, 8:50 AM  [place], [place], [address] Phone: [phone]   Fax:  [phone]

## 2015-05-06 NOTE — Patient Instructions (Signed)
1) Shoulder Protraction    Begin with elbows by your side, slowly "punch" straight out in front of you keeping arms/elbows straight. Repeat 12___times, _2___set/day.     2) Shoulder Flexion  Supine:     Standing:         Begin with arms at your side with thumbs pointed up, slowly raise both arms up and forward towards overhead. Repeat_12__times, _2__set/day.      3) Horizontal abduction/adduction  Supine:   Standing:           Begin with arms straight out in front of you, bring out to the side in at "T" shape. Keep arms straight entire time. Repeat _12___times, _2__sets/day.      4) Internal & External Rotation    *No band* -Stand with elbows at the side and elbows bent 90 degrees. Move your forearms away from your body, then bring back inward toward the body.  Repeat _12__times, __2_sets/day    5) Shoulder Abduction  Supine:     Standing:       Lying on your back begin with your arms flat on the table next to your side. Slowly move your arms out to the side so that they go overhead, in a jumping jack or snow angel movement. Repeat _12__times, __2_sets/day

## 2015-05-11 ENCOUNTER — Ambulatory Visit (HOSPITAL_COMMUNITY): Payer: BLUE CROSS/BLUE SHIELD | Admitting: Specialist

## 2015-05-11 DIAGNOSIS — M6289 Other specified disorders of muscle: Secondary | ICD-10-CM

## 2015-05-11 DIAGNOSIS — M629 Disorder of muscle, unspecified: Secondary | ICD-10-CM

## 2015-05-11 DIAGNOSIS — M25511 Pain in right shoulder: Secondary | ICD-10-CM | POA: Diagnosis not present

## 2015-05-11 DIAGNOSIS — R29898 Other symptoms and signs involving the musculoskeletal system: Secondary | ICD-10-CM

## 2015-05-11 NOTE — Therapy (Signed)
[place], [place], [address] Phone: [phone]   Fax:  [phone]  Occupational Therapy Treatment  Patient Details  Name: Jordan Perry MRN: [id number] Date of Birth: 1955/12/11 Referring Provider:  Carole Civil, MD  Encounter Date: 05/11/2015      OT End of Session - 05/11/15 0925    Visit Number 4   Number of Visits 8   Date for OT Re-Evaluation 06/30/15  mini reassess on 05/29/15   Authorization Type BCBS - no visit limit   OT Start Time 0850   OT Stop Time 0931   OT Time Calculation (min) 41 min   Activity Tolerance Patient tolerated treatment well   Behavior During Therapy [facility] for tasks assessed/performed      Past Medical History  Diagnosis Date  . Coronary atherosclerosis of unspecified type of vessel, native or graft   . Pure hyperglyceridemia   . Unspecified essential hypertension   . Type II or unspecified type diabetes mellitus without mention of complication, not stated as uncontrolled   . Allergic rhinitis, cause unspecified   . Hypothyroidism   . Proteinuria     Past Surgical History  Procedure Laterality Date  . Refractive surgery  1994  . Cystectomy  1989    removed from end of spine    There were no vitals filed for this visit.  Visit Diagnosis:  Tight fascia  Shoulder weakness      Subjective Assessment - 05/11/15 0851    Subjective  S:  It still is feeling really good.   Currently in Pain? No/denies            [facility] OT Assessment - 05/11/15 0001    Assessment   Diagnosis right rotator cuff tendonitis   Precautions   Precautions None   Restrictions   Weight Bearing Restrictions No                  OT Treatments/Exercises (OP) - 05/11/15 0001    Exercises   Exercises Shoulder   Shoulder Exercises: Supine   Protraction PROM;5 reps;Strengthening;10 reps   Protraction Weight (lbs) 1   Horizontal ABduction PROM;5 reps;Strengthening;10 reps    Horizontal ABduction Weight (lbs) 1   External Rotation PROM;5 reps;Strengthening;10 reps   External Rotation Weight (lbs) 1   Internal Rotation PROM;5 reps;Strengthening;10 reps   Internal Rotation Weight (lbs) 1   Flexion PROM;5 reps;Strengthening;10 reps   Shoulder Flexion Weight (lbs) 1   ABduction PROM;5 reps;Strengthening;10 reps   Shoulder ABduction Weight (lbs) 1   Shoulder Exercises: Seated   Protraction AROM;12 reps   Horizontal ABduction AROM;12 reps   External Rotation AROM;12 reps   Internal Rotation AROM;12 reps   Flexion AROM;12 reps   Abduction AROM;12 reps   Shoulder Exercises: Standing   External Rotation Theraband;15 reps   Theraband Level (Shoulder External Rotation) Level 2 (Red)   Internal Rotation Theraband;15 reps   Theraband Level (Shoulder Internal Rotation) Level 2 (Red)   Extension Theraband;15 reps   Theraband Level (Shoulder Extension) Level 2 (Red)   Row Theraband;15 reps   Theraband Level (Shoulder Row) Level 2 (Red)   Retraction Theraband;15 reps   Theraband Level (Shoulder Retraction) Level 2 (Red)   Shoulder Exercises: ROM/Strengthening   UBE (Upper Arm Bike) level 1 3 min forward and 3 min reverse   "W" Arms 15X   X to V Arms 15X   Proximal Shoulder Strengthening, Supine 10 times with 1#  Proximal Shoulder Strengthening, Seated 15 times    Ball on Wall 1 min with arm flexed tot 90 and 1 min with arm abducted to 90    Manual Therapy   Manual Therapy Myofascial release   Myofascial Release Myofascial release to right anterior and medial deltoid, trapezuis, & scapularis regions to decrease pain and fascial restrictions and increase joint range of motion.                   OT Short Term Goals - 05/05/15 1153    OT SHORT TERM GOAL #1   Title Patient will be educated and independent with HEP.   Time 4   Period Weeks   Status On-going   OT SHORT TERM GOAL #2   Title Patient will return to highest level of independence with all  daily, work, and leisure tasks.    Time 4   Period Weeks   Status On-going   OT SHORT TERM GOAL #3   Title Patient will decrease pain to 3/10 or less with daily and work tasks.    Time 4   Period Weeks   Status On-going   OT SHORT TERM GOAL #4   Title Patient will increase AROM to WNL to increase ability to reach into overhead cabinets.    Time 4   Period Weeks   Status On-going   OT SHORT TERM GOAL #5   Title Patient will increase RUE strength to 4+/5 to increase ability to complete work related tasks such as carrying heavy charts with less difficulty.    Time 4   Period Weeks   Status On-going   OT SHORT TERM GOAL #6   Title Patient will decrease fascial restrictions from Max to [place] amount.    Time 4   Period Weeks   Status On-going                  Plan - 05/11/15 9935    Clinical Impression Statement A:  Patient with full pain free PROM and AROM in supine, therefore added 1# in supine.  Added ball on wall in flexion and abduction, which was quite difficult for patient.   Plan P:  Increase ease with ball on wall exercise and add 1# to seated exercises, increase to green theraband to continue to improve proximal shoulder stability and scapular alignment.         Problem List Patient Active Problem List   Diagnosis Date Noted  . GERD (gastroesophageal reflux disease) 10/10/2012  . Coronary artery disease 10/06/2011  . Obesity 10/06/2011  . HYPERLIPIDEMIA TYPE I / IV 06/26/2009  . LEG PAIN 06/26/2009  . DIABETES MELLITUS, TYPE II 06/22/2009  . HYPERTENSION, UNSPECIFIED 06/22/2009  . ALLERGIC RHINITIS 06/22/2009    Vangie Bicker, OTR/L [phone]  05/11/2015, 9:29 AM  [place] [address] [place], [place], [address] Phone: [phone]   Fax:  [phone]

## 2015-05-15 ENCOUNTER — Ambulatory Visit (HOSPITAL_COMMUNITY): Payer: BLUE CROSS/BLUE SHIELD

## 2015-05-15 DIAGNOSIS — M6289 Other specified disorders of muscle: Secondary | ICD-10-CM

## 2015-05-15 DIAGNOSIS — M25511 Pain in right shoulder: Secondary | ICD-10-CM | POA: Diagnosis not present

## 2015-05-15 DIAGNOSIS — M25611 Stiffness of right shoulder, not elsewhere classified: Secondary | ICD-10-CM

## 2015-05-15 DIAGNOSIS — M629 Disorder of muscle, unspecified: Secondary | ICD-10-CM

## 2015-05-15 DIAGNOSIS — R29898 Other symptoms and signs involving the musculoskeletal system: Secondary | ICD-10-CM

## 2015-05-15 NOTE — Therapy (Signed)
[place], [place], [address] Phone: [phone]   Fax:  [phone]  Occupational Therapy Treatment  Patient Details  Name: Jordan Perry MRN: [id number] Date of Birth: 1955-10-25 Referring Provider:  Carole Civil, MD  Encounter Date: 05/15/2015      OT End of Session - 05/15/15 1020    Visit Number 5   Number of Visits 8   Date for OT Re-Evaluation 06/30/15  mini reassess on 05/29/15   Authorization Type BCBS - no visit limit   OT Start Time 0851   OT Stop Time 0930   OT Time Calculation (min) 39 min   Activity Tolerance Patient tolerated treatment well   Behavior During Therapy [facility] for tasks assessed/performed      Past Medical History  Diagnosis Date  . Coronary atherosclerosis of unspecified type of vessel, native or graft   . Pure hyperglyceridemia   . Unspecified essential hypertension   . Type II or unspecified type diabetes mellitus without mention of complication, not stated as uncontrolled   . Allergic rhinitis, cause unspecified   . Hypothyroidism   . Proteinuria     Past Surgical History  Procedure Laterality Date  . Refractive surgery  1994  . Cystectomy  1989    removed from end of spine    There were no vitals filed for this visit.  Visit Diagnosis:  Tight fascia  Shoulder weakness  Stiffness of joint, shoulder region, right      Subjective Assessment - 05/15/15 0941    Subjective  S: Last night the pain woek me up.   Currently in Pain? No/denies                      OT Treatments/Exercises (OP) - 05/15/15 0913    Exercises   Exercises Shoulder   Shoulder Exercises: Supine   Protraction PROM;5 reps;Strengthening;10 reps   Protraction Weight (lbs) 1   Horizontal ABduction PROM;5 reps;Strengthening;10 reps   Horizontal ABduction Weight (lbs) 1   External Rotation PROM;5 reps;Strengthening;10 reps   External Rotation Weight (lbs) 1   Internal  Rotation PROM;5 reps;Strengthening;10 reps   Internal Rotation Weight (lbs) 1   Flexion PROM;5 reps;Strengthening;10 reps   Shoulder Flexion Weight (lbs) 1   ABduction PROM;5 reps;Strengthening;10 reps   Shoulder ABduction Weight (lbs) 1   Shoulder Exercises: Standing   Protraction Strengthening;10 reps   Protraction Weight (lbs) 1   Horizontal ABduction Strengthening;10 reps   Horizontal ABduction Weight (lbs) 1   External Rotation Strengthening;10 reps   External Rotation Weight (lbs) 1   Internal Rotation Strengthening;10 reps   Internal Rotation Weight (lbs) 1   Flexion Strengthening;10 reps   Shoulder Flexion Weight (lbs) 1   ABduction Strengthening;10 reps   Shoulder ABduction Weight (lbs) 1   Extension Theraband;12 reps   Theraband Level (Shoulder Extension) Level 3 (Green)   Row [employer] reps   Theraband Level (Shoulder Row) Level 3 (Green)   Retraction Theraband;12 reps   Theraband Level (Shoulder Retraction) Level 3 (Green)   Shoulder Exercises: ROM/Strengthening   "W" Arms 15X   X to V Arms 10X with 1#   Proximal Shoulder Strengthening, Supine 10 times with 1#   Proximal Shoulder Strengthening, Seated 10X with 13   Ball on Wall 1 min with arm flexed tot 90 and 1 min with arm abducted to 90    Manual Therapy   Manual Therapy Myofascial release   Myofascial Release  Myofascial release to right anterior and medial deltoid, trapezuis, & scapularis regions to decrease pain and fascial restrictions and increase joint range of motion.                   OT Short Term Goals - 05/05/15 1153    OT SHORT TERM GOAL #1   Title Patient will be educated and independent with HEP.   Time 4   Period Weeks   Status On-going   OT SHORT TERM GOAL #2   Title Patient will return to highest level of independence with all daily, work, and leisure tasks.    Time 4   Period Weeks   Status On-going   OT SHORT TERM GOAL #3   Title Patient will decrease pain to 3/10 or less  with daily and work tasks.    Time 4   Period Weeks   Status On-going   OT SHORT TERM GOAL #4   Title Patient will increase AROM to WNL to increase ability to reach into overhead cabinets.    Time 4   Period Weeks   Status On-going   OT SHORT TERM GOAL #5   Title Patient will increase RUE strength to 4+/5 to increase ability to complete work related tasks such as carrying heavy charts with less difficulty.    Time 4   Period Weeks   Status On-going   OT SHORT TERM GOAL #6   Title Patient will decrease fascial restrictions from Max to [place] amount.    Time 4   Period Weeks   Status On-going                  Plan - 05/15/15 1020    Clinical Impression Statement A: Added 1# weight to standing exercises and increased theraband to green for scapular exercises. patient tolerated well. Pt did need vc's for proper form and technique for most of exercises.    Plan P: Add green theraband scapular exercises to HEP. Increase 1# strengthening exercises to 15 repetitions.         Problem List Patient Active Problem List   Diagnosis Date Noted  . GERD (gastroesophageal reflux disease) 10/10/2012  . Coronary artery disease 10/06/2011  . Obesity 10/06/2011  . HYPERLIPIDEMIA TYPE I / IV 06/26/2009  . LEG PAIN 06/26/2009  . DIABETES MELLITUS, TYPE II 06/22/2009  . HYPERTENSION, UNSPECIFIED 06/22/2009  . ALLERGIC RHINITIS 06/22/2009    Jordan Perry, OTR/L,CBIS  [phone]  05/15/2015, 10:23 AM  [name] [address] [place], [place], [address] Phone: [phone]   Fax:  [phone]

## 2015-05-19 ENCOUNTER — Encounter (HOSPITAL_COMMUNITY): Payer: Self-pay | Admitting: Occupational Therapy

## 2015-05-19 ENCOUNTER — Ambulatory Visit (HOSPITAL_COMMUNITY): Payer: BLUE CROSS/BLUE SHIELD | Admitting: Occupational Therapy

## 2015-05-19 DIAGNOSIS — M25511 Pain in right shoulder: Secondary | ICD-10-CM

## 2015-05-19 DIAGNOSIS — M25611 Stiffness of right shoulder, not elsewhere classified: Secondary | ICD-10-CM

## 2015-05-19 DIAGNOSIS — R29898 Other symptoms and signs involving the musculoskeletal system: Secondary | ICD-10-CM

## 2015-05-19 DIAGNOSIS — M629 Disorder of muscle, unspecified: Secondary | ICD-10-CM

## 2015-05-19 DIAGNOSIS — M6289 Other specified disorders of muscle: Secondary | ICD-10-CM

## 2015-05-19 NOTE — Patient Instructions (Signed)

## 2015-05-19 NOTE — Therapy (Signed)
[place], [place], [address] Phone: [phone]   Fax:  [phone]  Occupational Therapy Treatment  Patient Details  Name: Jordan Perry MRN: [id number] Date of Birth: 10/18/1956 Referring Provider:  Carole Civil, MD  Encounter Date: 05/19/2015      OT End of Session - 05/19/15 1616    Visit Number 6   Number of Visits 8   Date for OT Re-Evaluation 06/30/15  mini reassess on 05/29/15   Authorization Type BCBS - no visit limit   OT Start Time 1301   OT Stop Time 1344   OT Time Calculation (min) 43 min   Activity Tolerance Patient tolerated treatment well   Behavior During Therapy [facility] for tasks assessed/performed      Past Medical History  Diagnosis Date  . Coronary atherosclerosis of unspecified type of vessel, native or graft   . Pure hyperglyceridemia   . Unspecified essential hypertension   . Type II or unspecified type diabetes mellitus without mention of complication, not stated as uncontrolled   . Allergic rhinitis, cause unspecified   . Hypothyroidism   . Proteinuria     Past Surgical History  Procedure Laterality Date  . Refractive surgery  1994  . Cystectomy  1989    removed from end of spine    There were no vitals filed for this visit.  Visit Diagnosis:  Tight fascia  Shoulder weakness  Stiffness of joint, shoulder region, right  Pain in joint, shoulder region, right      Subjective Assessment - 05/19/15 1301    Subjective  S: My exercises are going pretty good at home.    Currently in Pain? No/denies            [facility] OT Assessment - 05/19/15 1615    Assessment   Diagnosis right rotator cuff tendonitis   Precautions   Precautions None                  OT Treatments/Exercises (OP) - 05/19/15 1304    Exercises   Exercises Shoulder   Shoulder Exercises: Supine   Protraction PROM;5 reps;Strengthening;15 reps   Protraction Weight (lbs) 1    Horizontal ABduction PROM;5 reps;Strengthening;15 reps   Horizontal ABduction Weight (lbs) 1   External Rotation PROM;5 reps;Strengthening;15 reps   External Rotation Weight (lbs) 1   Internal Rotation PROM;5 reps;Strengthening;15 reps   Internal Rotation Weight (lbs) 1   Flexion PROM;5 reps;Strengthening;15 reps   Shoulder Flexion Weight (lbs) 1   ABduction PROM;5 reps;Strengthening;15 reps   Shoulder ABduction Weight (lbs) 1   Shoulder Exercises: Standing   Protraction Strengthening;15 reps   Protraction Weight (lbs) 1   Horizontal ABduction Strengthening;15 reps   Horizontal ABduction Weight (lbs) 1   External Rotation Strengthening;15 reps   External Rotation Weight (lbs) 1   Internal Rotation Strengthening;15 reps   Internal Rotation Weight (lbs) 1   Flexion Strengthening;15 reps   Shoulder Flexion Weight (lbs) 1   ABduction Strengthening;15 reps   Shoulder ABduction Weight (lbs) 1   Extension Theraband;12 reps   Theraband Level (Shoulder Extension) Level 3 (Green)   Row [employer] reps   Theraband Level (Shoulder Row) Level 3 (Green)   Retraction Theraband;12 reps   Theraband Level (Shoulder Retraction) Level 3 (Green)   Shoulder Exercises: ROM/Strengthening   "W" Arms 15X   X to V Arms 10X with 1#   Proximal Shoulder Strengthening, Supine 10 times with 1#   Proximal Shoulder  Strengthening, Seated 10X with 1# weight   Ball on Wall 1 min with arm flexed tot 90 and 1 min with arm abducted to 90    Manual Therapy   Manual Therapy Myofascial release   Myofascial Release Myofascial release to right anterior and medial deltoid, trapezuis, & scapularis regions to decrease pain and fascial restrictions and increase joint range of motion.                 OT Education - 05/19/15 1615    Education provided Yes   Education Details scapular theraband (green)   Person(s) Educated Patient   Methods Explanation;Demonstration;Handout   Comprehension Verbalized  understanding;Returned demonstration          OT Short Term Goals - 05/05/15 1153    OT SHORT TERM GOAL #1   Title Patient will be educated and independent with HEP.   Time 4   Period Weeks   Status On-going   OT SHORT TERM GOAL #2   Title Patient will return to highest level of independence with all daily, work, and leisure tasks.    Time 4   Period Weeks   Status On-going   OT SHORT TERM GOAL #3   Title Patient will decrease pain to 3/10 or less with daily and work tasks.    Time 4   Period Weeks   Status On-going   OT SHORT TERM GOAL #4   Title Patient will increase AROM to WNL to increase ability to reach into overhead cabinets.    Time 4   Period Weeks   Status On-going   OT SHORT TERM GOAL #5   Title Patient will increase RUE strength to 4+/5 to increase ability to complete work related tasks such as carrying heavy charts with less difficulty.    Time 4   Period Weeks   Status On-going   OT SHORT TERM GOAL #6   Title Patient will decrease fascial restrictions from Max to [place] amount.    Time 4   Period Weeks   Status On-going                  Plan - 05/19/15 1616    Clinical Impression Statement A: Increased strengthening exercises to 15 repetitions. Added green scapular theraband to HEP. Pt tolerated treatment well, reports she is using 2 pound weights at home during her exercises.    Plan P: Follow-up on scapular theraband HEP. Increase weight to 2# (pt is using 2# at home) if appropriate, add overhead lacing        Problem List Patient Active Problem List   Diagnosis Date Noted  . GERD (gastroesophageal reflux disease) 10/10/2012  . Coronary artery disease 10/06/2011  . Obesity 10/06/2011  . HYPERLIPIDEMIA TYPE I / IV 06/26/2009  . LEG PAIN 06/26/2009  . DIABETES MELLITUS, TYPE II 06/22/2009  . HYPERTENSION, UNSPECIFIED 06/22/2009  . ALLERGIC RHINITIS 06/22/2009    Guadelupe Sabin, OTR/L  [phone]  05/19/2015, 4:18 PM  [place] [address] [place], [place], [address] Phone: [phone]   Fax:  [phone]

## 2015-05-22 ENCOUNTER — Encounter (HOSPITAL_COMMUNITY): Payer: Self-pay

## 2015-05-22 ENCOUNTER — Ambulatory Visit (HOSPITAL_COMMUNITY): Payer: BLUE CROSS/BLUE SHIELD

## 2015-05-22 DIAGNOSIS — M6289 Other specified disorders of muscle: Secondary | ICD-10-CM

## 2015-05-22 DIAGNOSIS — R29898 Other symptoms and signs involving the musculoskeletal system: Secondary | ICD-10-CM

## 2015-05-22 DIAGNOSIS — M629 Disorder of muscle, unspecified: Secondary | ICD-10-CM

## 2015-05-22 DIAGNOSIS — M25611 Stiffness of right shoulder, not elsewhere classified: Secondary | ICD-10-CM

## 2015-05-22 DIAGNOSIS — M25511 Pain in right shoulder: Secondary | ICD-10-CM

## 2015-05-22 NOTE — Therapy (Signed)
[place], [place], [address] Phone: [phone]   Fax:  [phone]  Occupational Therapy Treatment  Patient Details  Name: Jordan Perry MRN: [id number] Date of Birth: 1956-05-26 Referring Provider:  Carole Civil, MD  Encounter Date: 05/22/2015      OT End of Session - 05/22/15 0844    Visit Number 7   Number of Visits 8   Date for OT Re-Evaluation 06/30/15  mini reassess on 05/29/15   Authorization Type BCBS - no visit limit   OT Start Time 0800   OT Stop Time 0848   OT Time Calculation (min) 48 min   Activity Tolerance Patient tolerated treatment well   Behavior During Therapy [facility] for tasks assessed/performed      Past Medical History  Diagnosis Date  . Coronary atherosclerosis of unspecified type of vessel, native or graft   . Pure hyperglyceridemia   . Unspecified essential hypertension   . Type II or unspecified type diabetes mellitus without mention of complication, not stated as uncontrolled   . Allergic rhinitis, cause unspecified   . Hypothyroidism   . Proteinuria     Past Surgical History  Procedure Laterality Date  . Refractive surgery  1994  . Cystectomy  1989    removed from end of spine    There were no vitals filed for this visit.  Visit Diagnosis:  Tight fascia  Shoulder weakness  Stiffness of joint, shoulder region, right  Pain in joint, shoulder region, right      Subjective Assessment - 05/22/15 0817    Subjective  S: My arm isn't painful. it's just a little sore. I've had it worse before.    Currently in Pain? Yes   Pain Score 5    Pain Location Shoulder   Pain Orientation Right   Pain Descriptors / Indicators Sore   Pain Type Chronic pain            [facility] OT Assessment - 05/22/15 0818    Assessment   Diagnosis right rotator cuff tendonitis   Precautions   Precautions None                  OT Treatments/Exercises (OP) - 05/22/15  0818    Exercises   Exercises Shoulder   Shoulder Exercises: Supine   Protraction PROM;5 reps;Strengthening;15 reps   Protraction Weight (lbs) 2   Horizontal ABduction PROM;5 reps;Strengthening;15 reps   Horizontal ABduction Weight (lbs) 2   External Rotation PROM;5 reps;Strengthening;15 reps   External Rotation Weight (lbs) 2   Internal Rotation PROM;5 reps;Strengthening;15 reps   Internal Rotation Weight (lbs) 2   Flexion PROM;5 reps;Strengthening;15 reps   Shoulder Flexion Weight (lbs) 2   ABduction PROM;5 reps;Strengthening;15 reps   Shoulder ABduction Weight (lbs) 2   Shoulder Exercises: Standing   Protraction Strengthening;15 reps   Protraction Weight (lbs) 2   Horizontal ABduction Strengthening;15 reps   Horizontal ABduction Weight (lbs) 2   External Rotation Strengthening;15 reps   External Rotation Weight (lbs) 2   Internal Rotation Strengthening;15 reps   Internal Rotation Weight (lbs) 2   Flexion Strengthening;15 reps   Shoulder Flexion Weight (lbs) 2   ABduction Strengthening;15 reps   Shoulder ABduction Weight (lbs) 2   Shoulder Exercises: ROM/Strengthening   UBE (Upper Arm Bike) level 1 3 min forward and 3 min reverse   Cybex Press 2 plate;15 reps   Cybex Row 2 plate;15 reps   "W" Arms 15X with  2#   X to V Arms 15X with 2#   Proximal Shoulder Strengthening, Supine 15X with 2# no rest breaks   Proximal Shoulder Strengthening, Seated 15 with 2#   Ball on Wall 1' flexion 1' abduction green ball   Manual Therapy   Manual Therapy Myofascial release   Myofascial Release Myofascial release to right anterior and medial deltoid, trapezuis, & scapularis regions to decrease pain and fascial restrictions and increase joint range of motion.                   OT Short Term Goals - 05/05/15 1153    OT SHORT TERM GOAL #1   Title Patient will be educated and independent with HEP.   Time 4   Period Weeks   Status On-going   OT SHORT TERM GOAL #2   Title  Patient will return to highest level of independence with all daily, work, and leisure tasks.    Time 4   Period Weeks   Status On-going   OT SHORT TERM GOAL #3   Title Patient will decrease pain to 3/10 or less with daily and work tasks.    Time 4   Period Weeks   Status On-going   OT SHORT TERM GOAL #4   Title Patient will increase AROM to WNL to increase ability to reach into overhead cabinets.    Time 4   Period Weeks   Status On-going   OT SHORT TERM GOAL #5   Title Patient will increase RUE strength to 4+/5 to increase ability to complete work related tasks such as carrying heavy charts with less difficulty.    Time 4   Period Weeks   Status On-going   OT SHORT TERM GOAL #6   Title Patient will decrease fascial restrictions from Max to [place] amount.    Time 4   Period Weeks   Status On-going                  Plan - 05/22/15 3559    Clinical Impression Statement A: Increased to 2# weight with strengthening exercises and added cybex row and press. Patient tolerated well. Did not add overhead lacing due to time constraint.    Plan P: Simulate lifting heavy patient charts using weighted box. Add overhead lacing with wrist weight for overhead activity tolerance.         Problem List Patient Active Problem List   Diagnosis Date Noted  . GERD (gastroesophageal reflux disease) 10/10/2012  . Coronary artery disease 10/06/2011  . Obesity 10/06/2011  . HYPERLIPIDEMIA TYPE I / IV 06/26/2009  . LEG PAIN 06/26/2009  . DIABETES MELLITUS, TYPE II 06/22/2009  . HYPERTENSION, UNSPECIFIED 06/22/2009  . ALLERGIC RHINITIS 06/22/2009    Ailene Ravel, OTR/L,CBIS  [phone]  05/22/2015, 8:51 AM  [place], [place], [address] Phone: [phone]   Fax:  [phone]

## 2015-05-25 ENCOUNTER — Ambulatory Visit (HOSPITAL_COMMUNITY): Payer: BLUE CROSS/BLUE SHIELD | Attending: Orthopedic Surgery | Admitting: Specialist

## 2015-05-25 DIAGNOSIS — M25511 Pain in right shoulder: Secondary | ICD-10-CM | POA: Diagnosis present

## 2015-05-25 DIAGNOSIS — M25611 Stiffness of right shoulder, not elsewhere classified: Secondary | ICD-10-CM | POA: Diagnosis present

## 2015-05-25 DIAGNOSIS — M629 Disorder of muscle, unspecified: Secondary | ICD-10-CM | POA: Diagnosis present

## 2015-05-25 DIAGNOSIS — R29898 Other symptoms and signs involving the musculoskeletal system: Secondary | ICD-10-CM

## 2015-05-25 NOTE — Therapy (Signed)
[place], [place], [address] Phone: [phone]   Fax:  [phone]  Occupational Therapy Treatment  Patient Details  Name: Jordan Perry MRN: [id number] Date of Birth: 1956-06-12 Referring Provider:  Carole Civil, MD  Encounter Date: 05/25/2015      OT End of Session - 05/25/15 1016    Visit Number 8   Number of Visits 8   Date for OT Re-Evaluation 06/30/15  mini reassess on 05/29/15   Authorization Type BCBS - no visit limit   OT Start Time 0935   OT Stop Time 1019   OT Time Calculation (min) 44 min   Activity Tolerance Patient tolerated treatment well   Behavior During Therapy [facility] for tasks assessed/performed      Past Medical History  Diagnosis Date  . Coronary atherosclerosis of unspecified type of vessel, native or graft   . Pure hyperglyceridemia   . Unspecified essential hypertension   . Type II or unspecified type diabetes mellitus without mention of complication, not stated as uncontrolled   . Allergic rhinitis, cause unspecified   . Hypothyroidism   . Proteinuria     Past Surgical History  Procedure Laterality Date  . Refractive surgery  1994  . Cystectomy  1989    removed from end of spine    There were no vitals filed for this visit.  Visit Diagnosis:  Shoulder weakness  Stiffness of joint, shoulder region, right  Pain in joint, shoulder region, right      Subjective Assessment - 05/25/15 0939    Subjective  S:  My shoulder feels pretty good.   Currently in Pain? No/denies            [facility] OT Assessment - 05/25/15 0001    Assessment   Diagnosis right rotator cuff tendonitis   Precautions   Precautions None                  OT Treatments/Exercises (OP) - 05/25/15 0001    Exercises   Exercises Shoulder   Shoulder Exercises: Supine   Protraction PROM;5 reps;Strengthening;15 reps   Protraction Weight (lbs) 2   Horizontal ABduction PROM;5  reps;Strengthening;15 reps   Horizontal ABduction Weight (lbs) 2   External Rotation PROM;5 reps;Strengthening;15 reps   External Rotation Weight (lbs) 2   Internal Rotation PROM;5 reps;Strengthening;15 reps   Internal Rotation Weight (lbs) 2   Flexion PROM;5 reps;Strengthening;15 reps   Shoulder Flexion Weight (lbs) 2   ABduction PROM;5 reps;Strengthening;15 reps   Shoulder ABduction Weight (lbs) 2   Shoulder Exercises: Standing   Protraction Strengthening;15 reps   Protraction Weight (lbs) 2   Horizontal ABduction Strengthening;15 reps   Horizontal ABduction Weight (lbs) 2   External Rotation Strengthening;15 reps   External Rotation Weight (lbs) 2   Internal Rotation Strengthening;15 reps   Internal Rotation Weight (lbs) 2   Flexion Strengthening;15 reps   Shoulder Flexion Weight (lbs) 2   ABduction Strengthening;15 reps   Shoulder ABduction Weight (lbs) 2   Shoulder Exercises: ROM/Strengthening   UBE (Upper Arm Bike) level 2 3 minutes forward and 3 minutes reverse   Over Head Lace 2 minutes with 2#   "W" Arms 15X with 2#   X to V Arms 15X with 2#   Other ROM/Strengthening Exercises removed 5 binders from 2nd level bookshelf and then replaced with 2# cuff weight.  Patient used good from with activity   Manual Therapy   Manual Therapy Myofascial release  Myofascial Release Myofascial release to right anterior and medial deltoid, trapezuis, & scapularis regions to decrease pain and fascial restrictions and increase joint range of motion.                   OT Short Term Goals - 05/05/15 1153    OT SHORT TERM GOAL #1   Title Patient will be educated and independent with HEP.   Time 4   Period Weeks   Status On-going   OT SHORT TERM GOAL #2   Title Patient will return to highest level of independence with all daily, work, and leisure tasks.    Time 4   Period Weeks   Status On-going   OT SHORT TERM GOAL #3   Title Patient will decrease pain to 3/10 or less with  daily and work tasks.    Time 4   Period Weeks   Status On-going   OT SHORT TERM GOAL #4   Title Patient will increase AROM to WNL to increase ability to reach into overhead cabinets.    Time 4   Period Weeks   Status On-going   OT SHORT TERM GOAL #5   Title Patient will increase RUE strength to 4+/5 to increase ability to complete work related tasks such as carrying heavy charts with less difficulty.    Time 4   Period Weeks   Status On-going   OT SHORT TERM GOAL #6   Title Patient will decrease fascial restrictions from Max to [place] amount.    Time 4   Period Weeks   Status On-going                  Plan - 05/25/15 1016    Clinical Impression Statement A:  Patient demonstrates good form with exercises, occasional vg to tighten tummy muscles to avoid arching her back when reaching overhead.  Added overhead lace and simulated job activity.   Plan P:  Reassess for possible dc, update HEP.        Problem List Patient Active Problem List   Diagnosis Date Noted  . GERD (gastroesophageal reflux disease) 10/10/2012  . Coronary artery disease 10/06/2011  . Obesity 10/06/2011  . HYPERLIPIDEMIA TYPE I / IV 06/26/2009  . LEG PAIN 06/26/2009  . DIABETES MELLITUS, TYPE II 06/22/2009  . HYPERTENSION, UNSPECIFIED 06/22/2009  . ALLERGIC RHINITIS 06/22/2009    Vangie Bicker, OTR/L [phone]  05/25/2015, 10:18 AM  [place], [place], [address] Phone: [phone]   Fax:  [phone]

## 2015-05-29 ENCOUNTER — Encounter (HOSPITAL_COMMUNITY): Payer: Self-pay

## 2015-05-29 ENCOUNTER — Ambulatory Visit (HOSPITAL_COMMUNITY): Payer: BLUE CROSS/BLUE SHIELD

## 2015-05-29 DIAGNOSIS — M629 Disorder of muscle, unspecified: Secondary | ICD-10-CM

## 2015-05-29 DIAGNOSIS — M25611 Stiffness of right shoulder, not elsewhere classified: Secondary | ICD-10-CM

## 2015-05-29 DIAGNOSIS — R29898 Other symptoms and signs involving the musculoskeletal system: Secondary | ICD-10-CM | POA: Diagnosis not present

## 2015-05-29 DIAGNOSIS — M6289 Other specified disorders of muscle: Secondary | ICD-10-CM

## 2015-05-29 NOTE — Patient Instructions (Signed)
Strengthening: Chest Pull - Resisted   Hold Theraband in front of body with hands about shoulder width a part. Pull band a part and back together slowly. Repeat 10-15____ times. Complete __1__ set(s) per session.. Repeat ____ session(s) per day.  http://orth.exer.us/926   Copyright  VHI. All rights reserved.   PNF Strengthening: Resisted   Standing with resistive band around each hand, bring right arm up and away, thumb back. Repeat _10-15___ times per set. Do __1__ sets per session. Do ____ sessions per day.  http://orth.exer.us/918   Copyright  VHI. All rights reserved.   PNF Strengthening: Resisted   Standing with resistive band around each hand, bring right arm up and across body. Repeat _10-15___ times per set. Do __1__ sets per session. Do ____ sessions per day.  http://orth.exer.us/920   Copyright  VHI. All rights reserved.    Resisted External Rotation: in Neutral - Bilateral   Sit or stand, tubing in both hands, elbows at sides, bent to 90, forearms forward. Pinch shoulder blades together and rotate forearms out. Keep elbows at sides. Repeat _10-15___ times per set. Do _1___ sets per session. Do ____ sessions per day.  http://orth.exer.us/966   Copyright  VHI. All rights reserved.   PNF Strengthening: Resisted   Standing, hold resistive band above head. Bring right arm down and out from side. Repeat __10-15__ times per set. Do __1__ sets per session. Do ____ sessions per day.  http://orth.exer.us/922   Copyright  VHI. All rights reserved.

## 2015-05-29 NOTE — Therapy (Signed)
[place], [place], [address] Phone: [phone]   Fax:  [phone]  Occupational Therapy Treatment & reassessment  Patient Details  Name: Jordan Perry MRN: [id number] Date of Birth: 17-Jan-1956 Referring Provider:  Carole Civil, MD  Encounter Date: 05/29/2015      OT End of Session - 05/29/15 0913    Visit Number 9   Number of Visits 9   Authorization Type BCBS - no visit limit   OT Start Time 0803   OT Stop Time 0849   OT Time Calculation (min) 46 min   Activity Tolerance Patient tolerated treatment well   Behavior During Therapy [facility] for tasks assessed/performed      Past Medical History  Diagnosis Date  . Coronary atherosclerosis of unspecified type of vessel, native or graft   . Pure hyperglyceridemia   . Unspecified essential hypertension   . Type II or unspecified type diabetes mellitus without mention of complication, not stated as uncontrolled   . Allergic rhinitis, cause unspecified   . Hypothyroidism   . Proteinuria     Past Surgical History  Procedure Laterality Date  . Refractive surgery  1994  . Cystectomy  1989    removed from end of spine    There were no vitals filed for this visit.  Visit Diagnosis:  Shoulder weakness  Stiffness of joint, shoulder region, right  Tight fascia      Subjective Assessment - 05/29/15 0829    Subjective  S: I can definitely tell a difference and so can my husband.   Special Tests FOTO score: 84/100 (16% impaired)   Currently in Pain? No/denies            [facility] OT Assessment - 05/29/15 0815    Assessment   Diagnosis right rotator cuff tendonitis   Precautions   Precautions None   AROM   Overall AROM Comments Assessed seated. IR/ER adducted.   AROM Assessment Site Shoulder   Right/Left Shoulder Right   Right Shoulder Flexion 180 Degrees  on eval: 130   Right Shoulder ABduction 180 Degrees  on eval: 130   Right Shoulder  Internal Rotation 90 Degrees  same at eval   Right Shoulder External Rotation 82 Degrees  on eval: 60   Strength   Overall Strength Comments Assessed seated. IR/ER adducted.   Strength Assessment Site Shoulder   Right/Left Shoulder Right   Right Shoulder Flexion 5/5  on eval: 4-/5   Right Shoulder ABduction 4+/5  on eval: 4-/5   Right Shoulder Internal Rotation 4+/5  on eval; 3+/5   Right Shoulder External Rotation 5/5  on eval: 4-/5                  OT Treatments/Exercises (OP) - 05/29/15 0830    Exercises   Exercises Shoulder   Shoulder Exercises: Supine   Protraction PROM;5 reps   Horizontal ABduction PROM;5 reps   External Rotation PROM;5 reps   Internal Rotation PROM;5 reps   Flexion PROM;5 reps   ABduction PROM;5 reps   Shoulder Exercises: Seated   Horizontal ABduction Theraband;15 reps   Theraband Level (Shoulder Horizontal ABduction) Level 3 (Green)   External Rotation Theraband;15 reps   Theraband Level (Shoulder External Rotation) Level 3 (Green)   Internal Rotation Theraband;15 reps   Theraband Level (Shoulder Internal Rotation) Level 3 (Green)   Flexion Theraband;15 reps   Theraband Level (Shoulder Flexion) Level 3 (Green)   Abduction Theraband;15 reps  Theraband Level (Shoulder ABduction) Level 3 (Green)   Shoulder Exercises: ROM/Strengthening   UBE (Upper Arm Bike) Level 3 3' forward 3' reverse   Cybex Press 3 plate;20 reps   Cybex Row 3 plate;20 reps   Manual Therapy   Manual Therapy Myofascial release   Myofascial Release Myofascial release to right anterior and medial deltoid, trapezuis, & scapularis regions to decrease pain and fascial restrictions and increase joint range of motion.                 OT Education - 05/29/15 [phone]    Education provided Yes   Education Details green theraband strengthening exercises   Person(s) Educated Patient   Methods Explanation;Demonstration;Handout   Comprehension Returned  demonstration;Verbalized understanding          OT Short Term Goals - 05/29/15 0820    OT SHORT TERM GOAL #1   Title Patient will be educated and independent with HEP.   Time 4   Period Weeks   Status Achieved   OT SHORT TERM GOAL #2   Title Patient will return to highest level of independence with all daily, work, and leisure tasks.    Time 4   Period Weeks   Status Achieved   OT SHORT TERM GOAL #3   Title Patient will decrease pain to 3/10 or less with daily and work tasks.    Time 4   Period Weeks   Status Achieved   OT SHORT TERM GOAL #4   Title Patient will increase AROM to WNL to increase ability to reach into overhead cabinets.    Time 4   Period Weeks   Status Achieved   OT SHORT TERM GOAL #5   Title Patient will increase RUE strength to 4+/5 to increase ability to complete work related tasks such as carrying heavy charts with less difficulty.    Time 4   Period Weeks   Status Achieved   OT SHORT TERM GOAL #6   Title Patient will decrease fascial restrictions from Max to [place] amount.    Time 4   Period Weeks   Status Achieved                  Plan - 05/29/15 0913    Clinical Impression Statement A: Reassessment completed this date. Patient has met all therapy goals and strength and AROM measurements have increased. Updated HEP and reviewed. Pt agreeable to be discharged this date. Pt reports that she is doing all daily and work related tasks without pain.   Plan P: Discharge with HEP.        Problem List Patient Active Problem List   Diagnosis Date Noted  . GERD (gastroesophageal reflux disease) 10/10/2012  . Coronary artery disease 10/06/2011  . Obesity 10/06/2011  . HYPERLIPIDEMIA TYPE I / IV 06/26/2009  . LEG PAIN 06/26/2009  . DIABETES MELLITUS, TYPE II 06/22/2009  . HYPERTENSION, UNSPECIFIED 06/22/2009  . ALLERGIC RHINITIS 06/22/2009  OCCUPATIONAL THERAPY DISCHARGE SUMMARY  Visits from Start of Care: 9  Current functional level  related to goals / functional outcomes: See goals above   Remaining deficits: None. Patient has met all goals and is completing all daily and work activities without pain or difficulty.   Education / Equipment: Proper posture during work duties, scapular and shoulder strengthening exercises.  Plan: Patient agrees to discharge.  Patient goals were met. Patient is being discharged due to meeting the stated rehab goals.  ?????       Mickel Baas  Norvil Martensen, OTR/L,CBIS  [phone]  05/29/2015, 9:17 AM  [place] [address] [place], [place], [address] Phone: [phone]   Fax:  [phone]

## 2015-07-14 ENCOUNTER — Other Ambulatory Visit: Payer: Self-pay | Admitting: Cardiovascular Disease

## 2015-07-15 ENCOUNTER — Other Ambulatory Visit: Payer: Self-pay | Admitting: *Deleted

## 2015-07-15 NOTE — Telephone Encounter (Signed)
Spoke with pt who states she does not need a refill of Lasix at this time. Called [facility] and spoke to [place] who states the may have gone to far and may be shipped to the pt. I asked Tye Maryland if the patient could return the medicine unopened and receive a refund. Tye Maryland states she will note the account and file the paperwork.

## 2015-08-20 ENCOUNTER — Telehealth: Payer: Self-pay | Admitting: Cardiovascular Disease

## 2015-08-20 ENCOUNTER — Other Ambulatory Visit: Payer: Self-pay | Admitting: Cardiovascular Disease

## 2015-08-20 NOTE — Telephone Encounter (Signed)
Call placed to patient.  Pharmacy request refill for Lipitor.  No cholesterol labs noted in chart since 2012.  Per Dr. Bronson Ing last [place] dictation (08/28/2014), lipids are managed both by her diabetic specialist and her primary care physician.  Patient has upcoming appointment with Dr. Bronson Ing 08/28/2015 in the [place] office.  Will check with patient to see if she has enough medication to get her to that appointment.  Will let MD decide if he wants to refill.     Left message to return call.

## 2015-08-20 NOTE — Telephone Encounter (Signed)
Returning someones call 

## 2015-08-20 NOTE — Telephone Encounter (Signed)
LM at number given,cannot see that we called pt from [place] office

## 2015-08-28 ENCOUNTER — Other Ambulatory Visit: Payer: Self-pay | Admitting: *Deleted

## 2015-08-28 ENCOUNTER — Ambulatory Visit (INDEPENDENT_AMBULATORY_CARE_PROVIDER_SITE_OTHER): Payer: BLUE CROSS/BLUE SHIELD | Admitting: Cardiovascular Disease

## 2015-08-28 ENCOUNTER — Encounter: Payer: Self-pay | Admitting: Cardiovascular Disease

## 2015-08-28 VITALS — BP 126/76 | HR 90 | Ht 62.0 in | Wt 230.0 lb

## 2015-08-28 DIAGNOSIS — E783 Hyperchylomicronemia: Secondary | ICD-10-CM

## 2015-08-28 DIAGNOSIS — I10 Essential (primary) hypertension: Secondary | ICD-10-CM | POA: Diagnosis not present

## 2015-08-28 DIAGNOSIS — Z8249 Family history of ischemic heart disease and other diseases of the circulatory system: Secondary | ICD-10-CM | POA: Diagnosis not present

## 2015-08-28 DIAGNOSIS — E669 Obesity, unspecified: Secondary | ICD-10-CM | POA: Diagnosis not present

## 2015-08-28 MED ORDER — ATORVASTATIN CALCIUM 80 MG PO TABS: 80.0000 mg | ORAL_TABLET | Freq: Every day | ORAL | Status: DC

## 2015-08-28 NOTE — Progress Notes (Signed)
Patient ID: Jordan Perry, female   DOB: 1956-04-30, 59 y.o.   MRN: [id number]      SUBJECTIVE: The patient is a 59 year old woman with a family history of premature coronary artery disease and has a personal history of hypertension, hyperlipidemia, and type 2 diabetes mellitus. She herself does not have any established coronary artery disease. She denies anginal symptoms today, and also denies shortness of breath, orthopnea, paroxysmal nocturnal dyspnea, and leg swelling.  She works on [employer] at [employer]. She has worked there for 22 years.  ECG performed in the office today demonstrates normal sinus rhythm with no ischemic ST segment or T-wave abnormalities, nor any arrhythmias.    Review of Systems: As per "subjective", otherwise negative.  Allergies  Allergen Reactions  . Invokana [Canagliflozin] Other (See Comments)    Skin peeling  . Janumet [Sitagliptin-Metformin Hcl] Other (See Comments)    Skin peeling  . Penicillins     REACTION: rash, edema  . Prednisone     Current Outpatient Prescriptions  Medication Sig Dispense Refill  . aspirin 81 MG tablet Take 81 mg by mouth daily.      Marland Kitchen atorvastatin (LIPITOR) 80 MG tablet TAKE 1 TABLET DAILY 90 tablet 2  . furosemide (LASIX) 20 MG tablet TAKE 1 TABLET AS NEEDED 90 tablet 1  . insulin lispro protamine-lispro (HUMALOG 75/25 MIX) (75-25) 100 UNIT/ML SUSP injection Inject 80 Units into the skin 2 (two) times daily with a meal.    . Iron-Vitamins (GERITOL PO) Take by mouth daily.    Marland Kitchen levothyroxine (SYNTHROID, LEVOTHROID) 100 MCG tablet Take 100 mcg by mouth daily before breakfast.    . lisinopril (PRINIVIL,ZESTRIL) 20 MG tablet TAKE 1 TABLET DAILY 90 tablet 3  . Multiple Vitamins-Minerals (HAIR/SKIN/NAILS PO) Take by mouth.    . ranitidine (ZANTAC) 150 MG tablet Take 150 mg by mouth daily.     No current facility-administered medications for this visit.    Past Medical History  Diagnosis Date    . Coronary atherosclerosis of unspecified type of vessel, native or graft   . Pure hyperglyceridemia   . Unspecified essential hypertension   . Type II or unspecified type diabetes mellitus without mention of complication, not stated as uncontrolled   . Allergic rhinitis, cause unspecified   . Hypothyroidism   . Proteinuria     Past Surgical History  Procedure Laterality Date  . Refractive surgery  1994  . Cystectomy  1989    removed from end of spine    Social History   Social History  . Marital Status: Married    Spouse Name: N/A  . Number of Children: 0  . Years of Education: N/A   Occupational History  . 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 Smoking status: Never Smoker   . Smokeless tobacco: Never Used  . Alcohol Use: No  . Drug Use: No  . Sexual Activity: Not on file   Other Topics Concern  . Not on file   Social History Narrative     Filed Vitals:   08/28/15 1047  BP: 126/76  Pulse: 90  Height: 5\' 2"  (1.575 m)  Weight: 230 lb (104.327 kg)  SpO2: 97%    PHYSICAL EXAM General: NAD HEENT: Normal. Neck: No JVD, no thyromegaly. Lungs: Clear to auscultation bilaterally with normal respiratory effort. CV: Nondisplaced PMI. Regular rate and rhythm, normal S1/S2, no S3/S4, no murmur.  No pretibial or periankle edema. Normal pedal pulses.  Abdomen: Soft, nontender, obese, no distention.  Neurologic: Alert and oriented x 3.  Psych: Normal affect. Skin: Normal. Musculoskeletal: Normal range of motion, no gross deformities. Extremities: No clubbing or cyanosis.   ECG: Most recent ECG reviewed.      ASSESSMENT AND PLAN:  1. Essential HTN: Well-controlled today on lisinopril 20 mg daily. No changes required.  2. Hyperlipidemia: She takes Lipitor 80 mg daily and her lipids are managed both by her diabetic specialist and her primary care physician.  3. Type 2 diabetes: She takes insulin and sees a diabetic  specialist in  [place].  4. Exercise counseling was previously provided.  Dispo: f/u prn.  Kate Sable, M.D., F.A.C.C.

## 2015-08-28 NOTE — Telephone Encounter (Signed)
Patient seen in office by Dr. Bronson Ing in [place] this morning.  Dictation from today again states that her diabetic specialist & pmd are managing her lipids.  She will follow up with cardiology as needed moving forward.  Rx declined with message to request from managing provider.

## 2015-08-28 NOTE — Patient Instructions (Signed)
Your physician recommends that you schedule a follow-up appointment in: only as needed.       Thank you for choosing [facility] !         

## 2015-09-24 ENCOUNTER — Other Ambulatory Visit: Payer: Self-pay | Admitting: Cardiovascular Disease

## 2015-12-28 ENCOUNTER — Encounter (HOSPITAL_COMMUNITY): Payer: Self-pay

## 2016-02-01 DIAGNOSIS — H179 Unspecified corneal scar and opacity: Secondary | ICD-10-CM | POA: Diagnosis not present

## 2016-02-01 DIAGNOSIS — Z794 Long term (current) use of insulin: Secondary | ICD-10-CM | POA: Diagnosis not present

## 2016-02-01 DIAGNOSIS — E113311 Type 2 diabetes mellitus with moderate nonproliferative diabetic retinopathy with macular edema, right eye: Secondary | ICD-10-CM | POA: Diagnosis not present

## 2016-02-01 DIAGNOSIS — H2513 Age-related nuclear cataract, bilateral: Secondary | ICD-10-CM | POA: Diagnosis not present

## 2016-02-11 DIAGNOSIS — E7800 Pure hypercholesterolemia, unspecified: Secondary | ICD-10-CM | POA: Diagnosis not present

## 2016-02-11 DIAGNOSIS — E039 Hypothyroidism, unspecified: Secondary | ICD-10-CM | POA: Diagnosis not present

## 2016-02-11 DIAGNOSIS — E1165 Type 2 diabetes mellitus with hyperglycemia: Secondary | ICD-10-CM | POA: Diagnosis not present

## 2016-02-11 DIAGNOSIS — I10 Essential (primary) hypertension: Secondary | ICD-10-CM | POA: Diagnosis not present

## 2016-02-11 DIAGNOSIS — R809 Proteinuria, unspecified: Secondary | ICD-10-CM | POA: Diagnosis not present

## 2016-03-03 DIAGNOSIS — E113313 Type 2 diabetes mellitus with moderate nonproliferative diabetic retinopathy with macular edema, bilateral: Secondary | ICD-10-CM | POA: Diagnosis not present

## 2016-03-03 DIAGNOSIS — H2513 Age-related nuclear cataract, bilateral: Secondary | ICD-10-CM | POA: Diagnosis not present

## 2016-03-03 DIAGNOSIS — H179 Unspecified corneal scar and opacity: Secondary | ICD-10-CM | POA: Diagnosis not present

## 2016-03-07 ENCOUNTER — Ambulatory Visit: Payer: BLUE CROSS/BLUE SHIELD | Admitting: Cardiology

## 2016-03-17 DIAGNOSIS — E113313 Type 2 diabetes mellitus with moderate nonproliferative diabetic retinopathy with macular edema, bilateral: Secondary | ICD-10-CM | POA: Diagnosis not present

## 2016-04-12 ENCOUNTER — Ambulatory Visit (INDEPENDENT_AMBULATORY_CARE_PROVIDER_SITE_OTHER): Payer: BLUE CROSS/BLUE SHIELD | Admitting: Cardiology

## 2016-04-12 VITALS — BP 106/64 | HR 75 | Ht 64.0 in | Wt 239.0 lb

## 2016-04-12 DIAGNOSIS — I10 Essential (primary) hypertension: Secondary | ICD-10-CM | POA: Diagnosis not present

## 2016-04-12 DIAGNOSIS — Z8249 Family history of ischemic heart disease and other diseases of the circulatory system: Secondary | ICD-10-CM

## 2016-04-12 DIAGNOSIS — E785 Hyperlipidemia, unspecified: Secondary | ICD-10-CM

## 2016-04-12 NOTE — Patient Instructions (Signed)
Your physician wants you to follow-up in:  6 months Dr Branch You will receive a reminder letter in the mail two months in advance. If you don't receive a letter, please call our office to schedule the follow-up appointment.   Your physician recommends that you continue on your current medications as directed. Please refer to the Current Medication list given to you today.    If you need a refill on your cardiac medications before your next appointment, please call your pharmacy.    Thank you for choosing [facility] !        

## 2016-04-12 NOTE — Progress Notes (Signed)
Clinical Summary Ms. Sparling is a 60 y.o.female seen today for follow up of the following medical problems.  1. Family history of CAD - normal stress myoview in 2009 - remote history of cath when seeing Dr Verl Blalock, no significant CAD  - denies any chest pain. No SOB or DOE, though can have some generalized fatigue.  - walks up flights of stairs daily without troubles  2. HTN - does not check at home - compliant with meds  3. Hyperlipidemia - compliant with statin  4. DM2 - followed by pcp  5. OSA screen - occasional snoring, no witnessed apneic episodes, occasional daytime somnolence.  - sleeps 6 hours a night. Low energy since the recent passing of her sister.     SH: sister [name] who was pervious patient of mine who passed away 2015/11/06. Her husband Branden Ferrar is also a patient of mine.   - works as a Financial controller at [employer]  Past Medical History  Diagnosis Date  . Coronary atherosclerosis of unspecified type of vessel, native or graft   . Pure hyperglyceridemia   . Unspecified essential hypertension   . Type II or unspecified type diabetes mellitus without mention of complication, not stated as uncontrolled   . Allergic rhinitis, cause unspecified   . Hypothyroidism   . Proteinuria      Allergies  Allergen Reactions  . Invokana [Canagliflozin] Other (See Comments)    Skin peeling  . Janumet [Sitagliptin-Metformin Hcl] Other (See Comments)    Skin peeling  . Penicillins     REACTION: rash, edema  . Prednisone      Current Outpatient Prescriptions  Medication Sig Dispense Refill  . aspirin 81 MG tablet Take 81 mg by mouth daily.      Marland Kitchen atorvastatin (LIPITOR) 80 MG tablet Take 1 tablet (80 mg total) by mouth daily. 90 tablet 3  . furosemide (LASIX) 20 MG tablet TAKE 1 TABLET AS NEEDED 90 tablet 1  . insulin lispro protamine-lispro (HUMALOG 75/25 MIX) (75-25) 100 UNIT/ML SUSP injection Inject 80 Units into the skin 2 (two) times daily with a  meal.    . Iron-Vitamins (GERITOL PO) Take by mouth daily.    Marland Kitchen levothyroxine (SYNTHROID, LEVOTHROID) 100 MCG tablet Take 100 mcg by mouth daily before breakfast.    . lisinopril (PRINIVIL,ZESTRIL) 20 MG tablet TAKE 1 TABLET DAILY 90 tablet 2  . Multiple Vitamins-Minerals (HAIR/SKIN/NAILS PO) Take by mouth.    . ranitidine (ZANTAC) 150 MG tablet Take 150 mg by mouth daily.     No current facility-administered medications for this visit.     Past Surgical History  Procedure Laterality Date  . Refractive surgery  1994  . Cystectomy  1989    removed from end of spine     Allergies  Allergen Reactions  . Invokana [Canagliflozin] Other (See Comments)    Skin peeling  . Janumet [Sitagliptin-Metformin Hcl] Other (See Comments)    Skin peeling  . Penicillins     REACTION: rash, edema  . Prednisone       Family History  Problem Relation Age of Onset  . Coronary artery disease    . Diabetes    . Obesity    . Heart disease Father     and heart problems (unspecified0  . Heart attack Brother 65  . Coronary artery disease Sister   . Hyperlipidemia    . Hypertension    . COPD Mother 49  . Cancer Father  20     Social History Ms. Castorena reports that she has never smoked. She has never used smokeless tobacco. Ms. Kirschenman reports that she does not drink alcohol.   Review of Systems CONSTITUTIONAL: No weight loss, fever, chills, weakness or fatigue.  HEENT: Eyes: No visual loss, blurred vision, double vision or yellow sclerae.No hearing loss, sneezing, congestion, runny nose or sore throat.  SKIN: No rash or itching.  CARDIOVASCULAR: per HPI RESPIRATORY: No shortness of breath, cough or sputum.  GASTROINTESTINAL: No anorexia, nausea, vomiting or diarrhea. No abdominal pain or blood.  GENITOURINARY: No burning on urination, no polyuria NEUROLOGICAL: No headache, dizziness, syncope, paralysis, ataxia, numbness or tingling in the extremities. No change in bowel or bladder control.    MUSCULOSKELETAL: No muscle, back pain, joint pain or stiffness.  LYMPHATICS: No enlarged nodes. No history of splenectomy.  PSYCHIATRIC: No history of depression or anxiety.  ENDOCRINOLOGIC: No reports of sweating, cold or heat intolerance. No polyuria or polydipsia.  Marland Kitchen   Physical Examination Filed Vitals:   04/12/16 0946  BP: 106/64  Pulse: 75   Filed Vitals:   04/12/16 0946  Height: 5\' 4"  (1.626 m)  Weight: 239 lb (108.41 kg)    Gen: resting comfortably, no acute distress HEENT: no scleral icterus, pupils equal round and reactive, no palptable cervical adenopathy,  CV: RRR, no m/r/g, no jvd Resp: Clear to auscultation bilaterally GI: abdomen is soft, non-tender, non-distended, normal bowel sounds, no hepatosplenomegaly MSK: extremities are warm, no edema.  Skin: warm, no rash Neuro:  no focal deficits Psych: appropriate affect       Assessment and Plan  1. Family history of CAD - strong family history of CAD. She has no known history, and has had negative cath and prior stress testing - continue risk factor modification  2. HTN - at goal, continue current meds  3. Hyperlipidemia - request labs from pcp - continue current statin     F/u 6 months   Arnoldo Lenis, M.D.

## 2016-04-14 ENCOUNTER — Encounter: Payer: Self-pay | Admitting: Cardiology

## 2016-05-30 ENCOUNTER — Other Ambulatory Visit: Payer: Self-pay | Admitting: Cardiovascular Disease

## 2016-06-07 DIAGNOSIS — E1165 Type 2 diabetes mellitus with hyperglycemia: Secondary | ICD-10-CM | POA: Diagnosis not present

## 2016-06-07 DIAGNOSIS — E039 Hypothyroidism, unspecified: Secondary | ICD-10-CM | POA: Diagnosis not present

## 2016-06-07 DIAGNOSIS — E7800 Pure hypercholesterolemia, unspecified: Secondary | ICD-10-CM | POA: Diagnosis not present

## 2016-06-14 DIAGNOSIS — E7800 Pure hypercholesterolemia, unspecified: Secondary | ICD-10-CM | POA: Diagnosis not present

## 2016-06-14 DIAGNOSIS — E039 Hypothyroidism, unspecified: Secondary | ICD-10-CM | POA: Diagnosis not present

## 2016-06-14 DIAGNOSIS — E1165 Type 2 diabetes mellitus with hyperglycemia: Secondary | ICD-10-CM | POA: Diagnosis not present

## 2016-06-14 DIAGNOSIS — I10 Essential (primary) hypertension: Secondary | ICD-10-CM | POA: Diagnosis not present

## 2016-06-14 DIAGNOSIS — R300 Dysuria: Secondary | ICD-10-CM | POA: Diagnosis not present

## 2016-06-21 DIAGNOSIS — E1165 Type 2 diabetes mellitus with hyperglycemia: Secondary | ICD-10-CM | POA: Diagnosis not present

## 2016-06-30 DIAGNOSIS — L03114 Cellulitis of left upper limb: Secondary | ICD-10-CM | POA: Diagnosis not present

## 2016-06-30 DIAGNOSIS — Z6841 Body Mass Index (BMI) 40.0 and over, adult: Secondary | ICD-10-CM | POA: Diagnosis not present

## 2016-06-30 DIAGNOSIS — Z1389 Encounter for screening for other disorder: Secondary | ICD-10-CM | POA: Diagnosis not present

## 2016-06-30 DIAGNOSIS — W64XXXA Exposure to other animate mechanical forces, initial encounter: Secondary | ICD-10-CM | POA: Diagnosis not present

## 2016-07-25 DIAGNOSIS — M9901 Segmental and somatic dysfunction of cervical region: Secondary | ICD-10-CM | POA: Diagnosis not present

## 2016-07-25 DIAGNOSIS — M5033 Other cervical disc degeneration, cervicothoracic region: Secondary | ICD-10-CM | POA: Diagnosis not present

## 2016-07-27 DIAGNOSIS — M9901 Segmental and somatic dysfunction of cervical region: Secondary | ICD-10-CM | POA: Diagnosis not present

## 2016-07-27 DIAGNOSIS — M5033 Other cervical disc degeneration, cervicothoracic region: Secondary | ICD-10-CM | POA: Diagnosis not present

## 2016-07-28 DIAGNOSIS — M9901 Segmental and somatic dysfunction of cervical region: Secondary | ICD-10-CM | POA: Diagnosis not present

## 2016-07-28 DIAGNOSIS — M5033 Other cervical disc degeneration, cervicothoracic region: Secondary | ICD-10-CM | POA: Diagnosis not present

## 2016-08-01 DIAGNOSIS — M5033 Other cervical disc degeneration, cervicothoracic region: Secondary | ICD-10-CM | POA: Diagnosis not present

## 2016-08-01 DIAGNOSIS — M9901 Segmental and somatic dysfunction of cervical region: Secondary | ICD-10-CM | POA: Diagnosis not present

## 2016-08-02 DIAGNOSIS — M9901 Segmental and somatic dysfunction of cervical region: Secondary | ICD-10-CM | POA: Diagnosis not present

## 2016-08-02 DIAGNOSIS — M5033 Other cervical disc degeneration, cervicothoracic region: Secondary | ICD-10-CM | POA: Diagnosis not present

## 2016-08-03 ENCOUNTER — Other Ambulatory Visit: Payer: Self-pay | Admitting: Cardiovascular Disease

## 2016-08-03 DIAGNOSIS — M5033 Other cervical disc degeneration, cervicothoracic region: Secondary | ICD-10-CM | POA: Diagnosis not present

## 2016-08-03 DIAGNOSIS — M9901 Segmental and somatic dysfunction of cervical region: Secondary | ICD-10-CM | POA: Diagnosis not present

## 2016-08-08 DIAGNOSIS — M9901 Segmental and somatic dysfunction of cervical region: Secondary | ICD-10-CM | POA: Diagnosis not present

## 2016-08-08 DIAGNOSIS — M5033 Other cervical disc degeneration, cervicothoracic region: Secondary | ICD-10-CM | POA: Diagnosis not present

## 2016-08-10 DIAGNOSIS — M5033 Other cervical disc degeneration, cervicothoracic region: Secondary | ICD-10-CM | POA: Diagnosis not present

## 2016-08-10 DIAGNOSIS — M9901 Segmental and somatic dysfunction of cervical region: Secondary | ICD-10-CM | POA: Diagnosis not present

## 2016-08-11 DIAGNOSIS — M9901 Segmental and somatic dysfunction of cervical region: Secondary | ICD-10-CM | POA: Diagnosis not present

## 2016-08-11 DIAGNOSIS — M5033 Other cervical disc degeneration, cervicothoracic region: Secondary | ICD-10-CM | POA: Diagnosis not present

## 2016-08-15 DIAGNOSIS — M5033 Other cervical disc degeneration, cervicothoracic region: Secondary | ICD-10-CM | POA: Diagnosis not present

## 2016-08-15 DIAGNOSIS — M9901 Segmental and somatic dysfunction of cervical region: Secondary | ICD-10-CM | POA: Diagnosis not present

## 2016-08-17 DIAGNOSIS — M5033 Other cervical disc degeneration, cervicothoracic region: Secondary | ICD-10-CM | POA: Diagnosis not present

## 2016-08-17 DIAGNOSIS — M9901 Segmental and somatic dysfunction of cervical region: Secondary | ICD-10-CM | POA: Diagnosis not present

## 2016-08-18 DIAGNOSIS — M9901 Segmental and somatic dysfunction of cervical region: Secondary | ICD-10-CM | POA: Diagnosis not present

## 2016-08-18 DIAGNOSIS — E113313 Type 2 diabetes mellitus with moderate nonproliferative diabetic retinopathy with macular edema, bilateral: Secondary | ICD-10-CM | POA: Diagnosis not present

## 2016-08-18 DIAGNOSIS — M5033 Other cervical disc degeneration, cervicothoracic region: Secondary | ICD-10-CM | POA: Diagnosis not present

## 2016-08-22 DIAGNOSIS — M5033 Other cervical disc degeneration, cervicothoracic region: Secondary | ICD-10-CM | POA: Diagnosis not present

## 2016-08-22 DIAGNOSIS — M9901 Segmental and somatic dysfunction of cervical region: Secondary | ICD-10-CM | POA: Diagnosis not present

## 2016-08-24 DIAGNOSIS — M9901 Segmental and somatic dysfunction of cervical region: Secondary | ICD-10-CM | POA: Diagnosis not present

## 2016-08-24 DIAGNOSIS — M5033 Other cervical disc degeneration, cervicothoracic region: Secondary | ICD-10-CM | POA: Diagnosis not present

## 2016-08-29 DIAGNOSIS — M9901 Segmental and somatic dysfunction of cervical region: Secondary | ICD-10-CM | POA: Diagnosis not present

## 2016-08-29 DIAGNOSIS — M5033 Other cervical disc degeneration, cervicothoracic region: Secondary | ICD-10-CM | POA: Diagnosis not present

## 2016-08-31 DIAGNOSIS — M9901 Segmental and somatic dysfunction of cervical region: Secondary | ICD-10-CM | POA: Diagnosis not present

## 2016-08-31 DIAGNOSIS — M5033 Other cervical disc degeneration, cervicothoracic region: Secondary | ICD-10-CM | POA: Diagnosis not present

## 2016-09-01 DIAGNOSIS — M5033 Other cervical disc degeneration, cervicothoracic region: Secondary | ICD-10-CM | POA: Diagnosis not present

## 2016-09-01 DIAGNOSIS — M9901 Segmental and somatic dysfunction of cervical region: Secondary | ICD-10-CM | POA: Diagnosis not present

## 2016-09-05 DIAGNOSIS — M9901 Segmental and somatic dysfunction of cervical region: Secondary | ICD-10-CM | POA: Diagnosis not present

## 2016-09-05 DIAGNOSIS — M5033 Other cervical disc degeneration, cervicothoracic region: Secondary | ICD-10-CM | POA: Diagnosis not present

## 2016-09-07 DIAGNOSIS — M5033 Other cervical disc degeneration, cervicothoracic region: Secondary | ICD-10-CM | POA: Diagnosis not present

## 2016-09-07 DIAGNOSIS — M9901 Segmental and somatic dysfunction of cervical region: Secondary | ICD-10-CM | POA: Diagnosis not present

## 2016-09-08 DIAGNOSIS — M5033 Other cervical disc degeneration, cervicothoracic region: Secondary | ICD-10-CM | POA: Diagnosis not present

## 2016-09-08 DIAGNOSIS — M9901 Segmental and somatic dysfunction of cervical region: Secondary | ICD-10-CM | POA: Diagnosis not present

## 2016-09-12 DIAGNOSIS — M9901 Segmental and somatic dysfunction of cervical region: Secondary | ICD-10-CM | POA: Diagnosis not present

## 2016-09-12 DIAGNOSIS — M5033 Other cervical disc degeneration, cervicothoracic region: Secondary | ICD-10-CM | POA: Diagnosis not present

## 2016-09-19 DIAGNOSIS — M5033 Other cervical disc degeneration, cervicothoracic region: Secondary | ICD-10-CM | POA: Diagnosis not present

## 2016-09-19 DIAGNOSIS — M9901 Segmental and somatic dysfunction of cervical region: Secondary | ICD-10-CM | POA: Diagnosis not present

## 2016-09-21 DIAGNOSIS — M9901 Segmental and somatic dysfunction of cervical region: Secondary | ICD-10-CM | POA: Diagnosis not present

## 2016-09-21 DIAGNOSIS — M5033 Other cervical disc degeneration, cervicothoracic region: Secondary | ICD-10-CM | POA: Diagnosis not present

## 2016-09-22 DIAGNOSIS — M5033 Other cervical disc degeneration, cervicothoracic region: Secondary | ICD-10-CM | POA: Diagnosis not present

## 2016-09-22 DIAGNOSIS — M9901 Segmental and somatic dysfunction of cervical region: Secondary | ICD-10-CM | POA: Diagnosis not present

## 2016-09-26 DIAGNOSIS — M9901 Segmental and somatic dysfunction of cervical region: Secondary | ICD-10-CM | POA: Diagnosis not present

## 2016-09-26 DIAGNOSIS — M5033 Other cervical disc degeneration, cervicothoracic region: Secondary | ICD-10-CM | POA: Diagnosis not present

## 2016-09-28 DIAGNOSIS — M9901 Segmental and somatic dysfunction of cervical region: Secondary | ICD-10-CM | POA: Diagnosis not present

## 2016-09-28 DIAGNOSIS — M5033 Other cervical disc degeneration, cervicothoracic region: Secondary | ICD-10-CM | POA: Diagnosis not present

## 2016-09-29 DIAGNOSIS — M9901 Segmental and somatic dysfunction of cervical region: Secondary | ICD-10-CM | POA: Diagnosis not present

## 2016-09-29 DIAGNOSIS — M5033 Other cervical disc degeneration, cervicothoracic region: Secondary | ICD-10-CM | POA: Diagnosis not present

## 2016-10-03 DIAGNOSIS — M9901 Segmental and somatic dysfunction of cervical region: Secondary | ICD-10-CM | POA: Diagnosis not present

## 2016-10-03 DIAGNOSIS — M5033 Other cervical disc degeneration, cervicothoracic region: Secondary | ICD-10-CM | POA: Diagnosis not present

## 2016-10-04 ENCOUNTER — Encounter: Payer: Self-pay | Admitting: Cardiology

## 2016-10-05 DIAGNOSIS — M5033 Other cervical disc degeneration, cervicothoracic region: Secondary | ICD-10-CM | POA: Diagnosis not present

## 2016-10-05 DIAGNOSIS — M9901 Segmental and somatic dysfunction of cervical region: Secondary | ICD-10-CM | POA: Diagnosis not present

## 2016-10-06 DIAGNOSIS — M9901 Segmental and somatic dysfunction of cervical region: Secondary | ICD-10-CM | POA: Diagnosis not present

## 2016-10-06 DIAGNOSIS — M5033 Other cervical disc degeneration, cervicothoracic region: Secondary | ICD-10-CM | POA: Diagnosis not present

## 2016-10-07 DIAGNOSIS — E1165 Type 2 diabetes mellitus with hyperglycemia: Secondary | ICD-10-CM | POA: Diagnosis not present

## 2016-10-07 DIAGNOSIS — E039 Hypothyroidism, unspecified: Secondary | ICD-10-CM | POA: Diagnosis not present

## 2016-10-07 DIAGNOSIS — E7800 Pure hypercholesterolemia, unspecified: Secondary | ICD-10-CM | POA: Diagnosis not present

## 2016-10-07 DIAGNOSIS — I10 Essential (primary) hypertension: Secondary | ICD-10-CM | POA: Diagnosis not present

## 2016-10-18 ENCOUNTER — Ambulatory Visit: Payer: BLUE CROSS/BLUE SHIELD | Admitting: Cardiology

## 2016-10-26 DIAGNOSIS — M9901 Segmental and somatic dysfunction of cervical region: Secondary | ICD-10-CM | POA: Diagnosis not present

## 2016-10-26 DIAGNOSIS — M5033 Other cervical disc degeneration, cervicothoracic region: Secondary | ICD-10-CM | POA: Diagnosis not present

## 2016-10-27 DIAGNOSIS — M9901 Segmental and somatic dysfunction of cervical region: Secondary | ICD-10-CM | POA: Diagnosis not present

## 2016-10-27 DIAGNOSIS — M5033 Other cervical disc degeneration, cervicothoracic region: Secondary | ICD-10-CM | POA: Diagnosis not present

## 2016-10-31 DIAGNOSIS — M5033 Other cervical disc degeneration, cervicothoracic region: Secondary | ICD-10-CM | POA: Diagnosis not present

## 2016-10-31 DIAGNOSIS — M9901 Segmental and somatic dysfunction of cervical region: Secondary | ICD-10-CM | POA: Diagnosis not present

## 2016-11-02 DIAGNOSIS — M5033 Other cervical disc degeneration, cervicothoracic region: Secondary | ICD-10-CM | POA: Diagnosis not present

## 2016-11-02 DIAGNOSIS — M9901 Segmental and somatic dysfunction of cervical region: Secondary | ICD-10-CM | POA: Diagnosis not present

## 2016-11-03 DIAGNOSIS — M5033 Other cervical disc degeneration, cervicothoracic region: Secondary | ICD-10-CM | POA: Diagnosis not present

## 2016-11-03 DIAGNOSIS — M9901 Segmental and somatic dysfunction of cervical region: Secondary | ICD-10-CM | POA: Diagnosis not present

## 2016-11-07 DIAGNOSIS — M5033 Other cervical disc degeneration, cervicothoracic region: Secondary | ICD-10-CM | POA: Diagnosis not present

## 2016-11-07 DIAGNOSIS — M9901 Segmental and somatic dysfunction of cervical region: Secondary | ICD-10-CM | POA: Diagnosis not present

## 2016-11-09 ENCOUNTER — Ambulatory Visit: Payer: BLUE CROSS/BLUE SHIELD | Admitting: Cardiology

## 2016-11-14 DIAGNOSIS — M9901 Segmental and somatic dysfunction of cervical region: Secondary | ICD-10-CM | POA: Diagnosis not present

## 2016-11-14 DIAGNOSIS — M5033 Other cervical disc degeneration, cervicothoracic region: Secondary | ICD-10-CM | POA: Diagnosis not present

## 2016-11-17 DIAGNOSIS — M9901 Segmental and somatic dysfunction of cervical region: Secondary | ICD-10-CM | POA: Diagnosis not present

## 2016-11-17 DIAGNOSIS — M5033 Other cervical disc degeneration, cervicothoracic region: Secondary | ICD-10-CM | POA: Diagnosis not present

## 2016-11-28 DIAGNOSIS — M9901 Segmental and somatic dysfunction of cervical region: Secondary | ICD-10-CM | POA: Diagnosis not present

## 2016-11-28 DIAGNOSIS — M5033 Other cervical disc degeneration, cervicothoracic region: Secondary | ICD-10-CM | POA: Diagnosis not present

## 2016-12-01 DIAGNOSIS — M5033 Other cervical disc degeneration, cervicothoracic region: Secondary | ICD-10-CM | POA: Diagnosis not present

## 2016-12-01 DIAGNOSIS — M9901 Segmental and somatic dysfunction of cervical region: Secondary | ICD-10-CM | POA: Diagnosis not present

## 2016-12-05 DIAGNOSIS — M9901 Segmental and somatic dysfunction of cervical region: Secondary | ICD-10-CM | POA: Diagnosis not present

## 2016-12-05 DIAGNOSIS — M5033 Other cervical disc degeneration, cervicothoracic region: Secondary | ICD-10-CM | POA: Diagnosis not present

## 2016-12-07 DIAGNOSIS — M9901 Segmental and somatic dysfunction of cervical region: Secondary | ICD-10-CM | POA: Diagnosis not present

## 2016-12-07 DIAGNOSIS — M5033 Other cervical disc degeneration, cervicothoracic region: Secondary | ICD-10-CM | POA: Diagnosis not present

## 2016-12-12 DIAGNOSIS — M5033 Other cervical disc degeneration, cervicothoracic region: Secondary | ICD-10-CM | POA: Diagnosis not present

## 2016-12-12 DIAGNOSIS — M9901 Segmental and somatic dysfunction of cervical region: Secondary | ICD-10-CM | POA: Diagnosis not present

## 2016-12-14 DIAGNOSIS — M5033 Other cervical disc degeneration, cervicothoracic region: Secondary | ICD-10-CM | POA: Diagnosis not present

## 2016-12-14 DIAGNOSIS — M9901 Segmental and somatic dysfunction of cervical region: Secondary | ICD-10-CM | POA: Diagnosis not present

## 2016-12-16 ENCOUNTER — Ambulatory Visit (INDEPENDENT_AMBULATORY_CARE_PROVIDER_SITE_OTHER): Payer: BLUE CROSS/BLUE SHIELD | Admitting: Cardiology

## 2016-12-16 ENCOUNTER — Encounter: Payer: Self-pay | Admitting: Cardiology

## 2016-12-16 VITALS — BP 106/70 | HR 81 | Ht 64.0 in | Wt 241.0 lb

## 2016-12-16 DIAGNOSIS — I10 Essential (primary) hypertension: Secondary | ICD-10-CM | POA: Diagnosis not present

## 2016-12-16 DIAGNOSIS — Z8249 Family history of ischemic heart disease and other diseases of the circulatory system: Secondary | ICD-10-CM

## 2016-12-16 DIAGNOSIS — R600 Localized edema: Secondary | ICD-10-CM

## 2016-12-16 DIAGNOSIS — E782 Mixed hyperlipidemia: Secondary | ICD-10-CM | POA: Diagnosis not present

## 2016-12-16 NOTE — Progress Notes (Signed)
Clinical Summary Jordan Perry is a 61 y.o.female seen today for follow up of the following medical problems.  1. Family history of CAD - normal stress myoview in 2009 - remote history of cath when seeing Dr Verl Blalock, no significant CAD   - no chest pain, no SOB or DOE since last visit   2. HTN - she is compliant with meds  3. Hyperlipidemia - compliant with her statin  4. DM2 - followed by pcp  5. LE edema - has prn lasix, has not reqruied   SH: sister Jordan Perry who was pervious patient of mine who passed away 11/25/2015. Her husband Jordan Perry is also a patient of mine.   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 Allergic rhinitis, cause unspecified   . Coronary atherosclerosis of unspecified type of vessel, native or graft   . Hypothyroidism   . Proteinuria   . Pure hyperglyceridemia   . Type II or unspecified type diabetes mellitus without mention of complication, not stated as uncontrolled   . Unspecified essential hypertension      Allergies  Allergen Reactions  . Invokana [Canagliflozin] Other (See Comments)    Skin peeling  . Janumet [Sitagliptin-Metformin Hcl] Other (See Comments)    Skin peeling  . Penicillins     REACTION: rash, edema  . Prednisone      Current Outpatient Prescriptions  Medication Sig Dispense Refill  . aspirin 81 MG tablet Take 81 mg by mouth daily.      Marland Kitchen atorvastatin (LIPITOR) 80 MG tablet TAKE 1 TABLET DAILY 90 tablet 3  . furosemide (LASIX) 20 MG tablet TAKE 1 TABLET AS NEEDED 90 tablet 1  . insulin lispro protamine-lispro (HUMALOG 75/25 MIX) (75-25) 100 UNIT/ML SUSP injection Inject 80 Units into the skin 2 (two) times daily with a meal.    . levothyroxine (SYNTHROID, LEVOTHROID) 100 MCG tablet Take 100 mcg by mouth daily before breakfast.    . lisinopril (PRINIVIL,ZESTRIL) 20 MG tablet TAKE 1 TABLET DAILY 90 tablet 3  . metFORMIN  (GLUCOPHAGE-XR) 500 MG 24 hr tablet Take 500 mg by mouth 2 (two) times daily after a meal.     . ranitidine (ZANTAC) 150 MG tablet Take 150 mg by mouth daily.     No current facility-administered medications for this visit.      Past Surgical History:  Procedure Laterality Date  . CYSTECTOMY  1989   removed from end of spine  . REFRACTIVE SURGERY  1994     Allergies  Allergen Reactions  . Invokana [Canagliflozin] Other (See Comments)    Skin peeling  . Janumet [Sitagliptin-Metformin Hcl] Other (See Comments)    Skin peeling  . Penicillins     REACTION: rash, edema  . Prednisone       Family History  Problem Relation Age of Onset  . Coronary artery disease    . Diabetes    . Obesity    . Heart disease Father     and heart problems (unspecified0  . Heart attack Brother 76  . Coronary artery disease Sister   . Hyperlipidemia    . Hypertension    . COPD Mother 54  . Cancer Father 69     Social History Jordan Perry reports that she has never smoked. She has never used smokeless tobacco. Jordan Perry reports that she does not drink alcohol.  Review of Systems CONSTITUTIONAL: No weight loss, fever, chills, weakness or fatigue.  HEENT: Eyes: No visual loss, blurred vision, double vision or yellow sclerae.No hearing loss, sneezing, congestion, runny nose or sore throat.  SKIN: No rash or itching.  CARDIOVASCULAR: per HPI RESPIRATORY: No shortness of breath, cough or sputum.  GASTROINTESTINAL: No anorexia, nausea, vomiting or diarrhea. No abdominal pain or blood.  GENITOURINARY: No burning on urination, no polyuria NEUROLOGICAL: No headache, dizziness, syncope, paralysis, ataxia, numbness or tingling in the extremities. No change in bowel or bladder control.  MUSCULOSKELETAL: No muscle, back pain, joint pain or stiffness.  LYMPHATICS: No enlarged nodes. No history of splenectomy.  PSYCHIATRIC: No history of depression or anxiety.  ENDOCRINOLOGIC: No reports of sweating,  cold or heat intolerance. No polyuria or polydipsia.  Marland Kitchen   Physical Examination Vitals:   12/16/16 1310  BP: 106/70  Pulse: 81   Vitals:   12/16/16 1310  Weight: 241 lb (109.3 kg)  Height: 5\' 4"  (1.626 m)    Gen: resting comfortably, no acute distress HEENT: no scleral icterus, pupils equal round and reactive, no palptable cervical adenopathy,  CV: RRR, no m/r/g, no jvd Resp: Clear to auscultation bilaterally GI: abdomen is soft, non-tender, non-distended, normal bowel sounds, no hepatosplenomegaly MSK: extremities are warm, no edema.  Skin: warm, no rash Neuro:  no focal deficits Psych: appropriate affect    Assessment and Plan   1. Family history of CAD - strong family history of CAD. She has no known history, and has had negative cath and prior stress testing - no recent symptoms - continue to monitor. Continue risk factor modification.  - EKG in clinic without acute iscemic changes   2. HTN - she is at goal, continue current meds  3. Hyperlipidemia - we will request labs from pcp - she will continue current statin  4. Lower extremit edema - continue prn lasix.    F/u 6 months     Arnoldo Lenis, M.D.

## 2016-12-16 NOTE — Patient Instructions (Signed)
Your physician wants you to follow-up in: 1 year Dr Branch You will receive a reminder letter in the mail two months in advance. If you don't receive a letter, please call our office to schedule the follow-up appointment.    Your physician recommends that you continue on your current medications as directed. Please refer to the Current Medication list given to you today.    If you need a refill on your cardiac medications before your next appointment, please call your pharmacy.    Thank you for choosing [facility] !         

## 2016-12-21 DIAGNOSIS — M9901 Segmental and somatic dysfunction of cervical region: Secondary | ICD-10-CM | POA: Diagnosis not present

## 2016-12-21 DIAGNOSIS — M5033 Other cervical disc degeneration, cervicothoracic region: Secondary | ICD-10-CM | POA: Diagnosis not present

## 2016-12-28 DIAGNOSIS — M5033 Other cervical disc degeneration, cervicothoracic region: Secondary | ICD-10-CM | POA: Diagnosis not present

## 2016-12-28 DIAGNOSIS — M9901 Segmental and somatic dysfunction of cervical region: Secondary | ICD-10-CM | POA: Diagnosis not present

## 2017-01-04 DIAGNOSIS — M5033 Other cervical disc degeneration, cervicothoracic region: Secondary | ICD-10-CM | POA: Diagnosis not present

## 2017-01-04 DIAGNOSIS — M9901 Segmental and somatic dysfunction of cervical region: Secondary | ICD-10-CM | POA: Diagnosis not present

## 2017-01-12 DIAGNOSIS — M9901 Segmental and somatic dysfunction of cervical region: Secondary | ICD-10-CM | POA: Diagnosis not present

## 2017-01-12 DIAGNOSIS — M5033 Other cervical disc degeneration, cervicothoracic region: Secondary | ICD-10-CM | POA: Diagnosis not present

## 2017-01-18 DIAGNOSIS — M9901 Segmental and somatic dysfunction of cervical region: Secondary | ICD-10-CM | POA: Diagnosis not present

## 2017-01-18 DIAGNOSIS — M5033 Other cervical disc degeneration, cervicothoracic region: Secondary | ICD-10-CM | POA: Diagnosis not present

## 2017-01-25 DIAGNOSIS — M9901 Segmental and somatic dysfunction of cervical region: Secondary | ICD-10-CM | POA: Diagnosis not present

## 2017-01-25 DIAGNOSIS — M5033 Other cervical disc degeneration, cervicothoracic region: Secondary | ICD-10-CM | POA: Diagnosis not present

## 2017-01-31 DIAGNOSIS — M5033 Other cervical disc degeneration, cervicothoracic region: Secondary | ICD-10-CM | POA: Diagnosis not present

## 2017-01-31 DIAGNOSIS — M9901 Segmental and somatic dysfunction of cervical region: Secondary | ICD-10-CM | POA: Diagnosis not present

## 2017-02-02 DIAGNOSIS — E1165 Type 2 diabetes mellitus with hyperglycemia: Secondary | ICD-10-CM | POA: Diagnosis not present

## 2017-02-02 DIAGNOSIS — E7800 Pure hypercholesterolemia, unspecified: Secondary | ICD-10-CM | POA: Diagnosis not present

## 2017-02-02 DIAGNOSIS — E039 Hypothyroidism, unspecified: Secondary | ICD-10-CM | POA: Diagnosis not present

## 2017-02-02 DIAGNOSIS — R809 Proteinuria, unspecified: Secondary | ICD-10-CM | POA: Diagnosis not present

## 2017-02-08 DIAGNOSIS — M9901 Segmental and somatic dysfunction of cervical region: Secondary | ICD-10-CM | POA: Diagnosis not present

## 2017-02-08 DIAGNOSIS — M5033 Other cervical disc degeneration, cervicothoracic region: Secondary | ICD-10-CM | POA: Diagnosis not present

## 2017-02-09 DIAGNOSIS — E039 Hypothyroidism, unspecified: Secondary | ICD-10-CM | POA: Diagnosis not present

## 2017-02-09 DIAGNOSIS — E1165 Type 2 diabetes mellitus with hyperglycemia: Secondary | ICD-10-CM | POA: Diagnosis not present

## 2017-02-09 DIAGNOSIS — E7800 Pure hypercholesterolemia, unspecified: Secondary | ICD-10-CM | POA: Diagnosis not present

## 2017-02-09 DIAGNOSIS — I10 Essential (primary) hypertension: Secondary | ICD-10-CM | POA: Diagnosis not present

## 2017-02-15 DIAGNOSIS — M5033 Other cervical disc degeneration, cervicothoracic region: Secondary | ICD-10-CM | POA: Diagnosis not present

## 2017-02-15 DIAGNOSIS — M9901 Segmental and somatic dysfunction of cervical region: Secondary | ICD-10-CM | POA: Diagnosis not present

## 2017-02-17 DIAGNOSIS — E113313 Type 2 diabetes mellitus with moderate nonproliferative diabetic retinopathy with macular edema, bilateral: Secondary | ICD-10-CM | POA: Diagnosis not present

## 2017-02-17 DIAGNOSIS — H2513 Age-related nuclear cataract, bilateral: Secondary | ICD-10-CM | POA: Diagnosis not present

## 2017-02-17 DIAGNOSIS — H179 Unspecified corneal scar and opacity: Secondary | ICD-10-CM | POA: Diagnosis not present

## 2017-02-22 DIAGNOSIS — M5033 Other cervical disc degeneration, cervicothoracic region: Secondary | ICD-10-CM | POA: Diagnosis not present

## 2017-02-22 DIAGNOSIS — M9901 Segmental and somatic dysfunction of cervical region: Secondary | ICD-10-CM | POA: Diagnosis not present

## 2017-03-01 DIAGNOSIS — M5033 Other cervical disc degeneration, cervicothoracic region: Secondary | ICD-10-CM | POA: Diagnosis not present

## 2017-03-01 DIAGNOSIS — M9901 Segmental and somatic dysfunction of cervical region: Secondary | ICD-10-CM | POA: Diagnosis not present

## 2017-03-08 DIAGNOSIS — M5033 Other cervical disc degeneration, cervicothoracic region: Secondary | ICD-10-CM | POA: Diagnosis not present

## 2017-03-08 DIAGNOSIS — M9901 Segmental and somatic dysfunction of cervical region: Secondary | ICD-10-CM | POA: Diagnosis not present

## 2017-03-15 DIAGNOSIS — M9901 Segmental and somatic dysfunction of cervical region: Secondary | ICD-10-CM | POA: Diagnosis not present

## 2017-03-15 DIAGNOSIS — M5033 Other cervical disc degeneration, cervicothoracic region: Secondary | ICD-10-CM | POA: Diagnosis not present

## 2017-03-21 DIAGNOSIS — E113313 Type 2 diabetes mellitus with moderate nonproliferative diabetic retinopathy with macular edema, bilateral: Secondary | ICD-10-CM | POA: Diagnosis not present

## 2017-03-21 DIAGNOSIS — H179 Unspecified corneal scar and opacity: Secondary | ICD-10-CM | POA: Diagnosis not present

## 2017-03-21 DIAGNOSIS — H2513 Age-related nuclear cataract, bilateral: Secondary | ICD-10-CM | POA: Diagnosis not present

## 2017-03-23 DIAGNOSIS — M9901 Segmental and somatic dysfunction of cervical region: Secondary | ICD-10-CM | POA: Diagnosis not present

## 2017-03-23 DIAGNOSIS — M5033 Other cervical disc degeneration, cervicothoracic region: Secondary | ICD-10-CM | POA: Diagnosis not present

## 2017-03-29 DIAGNOSIS — M5033 Other cervical disc degeneration, cervicothoracic region: Secondary | ICD-10-CM | POA: Diagnosis not present

## 2017-03-29 DIAGNOSIS — M9901 Segmental and somatic dysfunction of cervical region: Secondary | ICD-10-CM | POA: Diagnosis not present

## 2017-04-05 DIAGNOSIS — M9901 Segmental and somatic dysfunction of cervical region: Secondary | ICD-10-CM | POA: Diagnosis not present

## 2017-04-05 DIAGNOSIS — M5033 Other cervical disc degeneration, cervicothoracic region: Secondary | ICD-10-CM | POA: Diagnosis not present

## 2017-04-12 DIAGNOSIS — M9901 Segmental and somatic dysfunction of cervical region: Secondary | ICD-10-CM | POA: Diagnosis not present

## 2017-04-12 DIAGNOSIS — M5033 Other cervical disc degeneration, cervicothoracic region: Secondary | ICD-10-CM | POA: Diagnosis not present

## 2017-04-19 DIAGNOSIS — M5033 Other cervical disc degeneration, cervicothoracic region: Secondary | ICD-10-CM | POA: Diagnosis not present

## 2017-04-19 DIAGNOSIS — M9901 Segmental and somatic dysfunction of cervical region: Secondary | ICD-10-CM | POA: Diagnosis not present

## 2017-04-24 DIAGNOSIS — H179 Unspecified corneal scar and opacity: Secondary | ICD-10-CM | POA: Diagnosis not present

## 2017-04-24 DIAGNOSIS — H2513 Age-related nuclear cataract, bilateral: Secondary | ICD-10-CM | POA: Diagnosis not present

## 2017-04-24 DIAGNOSIS — E113313 Type 2 diabetes mellitus with moderate nonproliferative diabetic retinopathy with macular edema, bilateral: Secondary | ICD-10-CM | POA: Diagnosis not present

## 2017-05-03 DIAGNOSIS — M9901 Segmental and somatic dysfunction of cervical region: Secondary | ICD-10-CM | POA: Diagnosis not present

## 2017-05-03 DIAGNOSIS — M5033 Other cervical disc degeneration, cervicothoracic region: Secondary | ICD-10-CM | POA: Diagnosis not present

## 2017-05-10 DIAGNOSIS — M5033 Other cervical disc degeneration, cervicothoracic region: Secondary | ICD-10-CM | POA: Diagnosis not present

## 2017-05-10 DIAGNOSIS — M9901 Segmental and somatic dysfunction of cervical region: Secondary | ICD-10-CM | POA: Diagnosis not present

## 2017-05-11 DIAGNOSIS — M79671 Pain in right foot: Secondary | ICD-10-CM | POA: Diagnosis not present

## 2017-05-11 DIAGNOSIS — M779 Enthesopathy, unspecified: Secondary | ICD-10-CM | POA: Diagnosis not present

## 2017-05-17 DIAGNOSIS — M9901 Segmental and somatic dysfunction of cervical region: Secondary | ICD-10-CM | POA: Diagnosis not present

## 2017-05-17 DIAGNOSIS — M5033 Other cervical disc degeneration, cervicothoracic region: Secondary | ICD-10-CM | POA: Diagnosis not present

## 2017-05-22 DIAGNOSIS — E113313 Type 2 diabetes mellitus with moderate nonproliferative diabetic retinopathy with macular edema, bilateral: Secondary | ICD-10-CM | POA: Diagnosis not present

## 2017-05-22 DIAGNOSIS — H2513 Age-related nuclear cataract, bilateral: Secondary | ICD-10-CM | POA: Diagnosis not present

## 2017-05-22 DIAGNOSIS — H179 Unspecified corneal scar and opacity: Secondary | ICD-10-CM | POA: Diagnosis not present

## 2017-05-24 DIAGNOSIS — M5033 Other cervical disc degeneration, cervicothoracic region: Secondary | ICD-10-CM | POA: Diagnosis not present

## 2017-05-24 DIAGNOSIS — M9901 Segmental and somatic dysfunction of cervical region: Secondary | ICD-10-CM | POA: Diagnosis not present

## 2017-05-26 ENCOUNTER — Other Ambulatory Visit: Payer: Self-pay | Admitting: Cardiology

## 2017-05-31 DIAGNOSIS — M5033 Other cervical disc degeneration, cervicothoracic region: Secondary | ICD-10-CM | POA: Diagnosis not present

## 2017-05-31 DIAGNOSIS — M9901 Segmental and somatic dysfunction of cervical region: Secondary | ICD-10-CM | POA: Diagnosis not present

## 2017-06-01 DIAGNOSIS — M2010 Hallux valgus (acquired), unspecified foot: Secondary | ICD-10-CM | POA: Diagnosis not present

## 2017-06-01 DIAGNOSIS — M79672 Pain in left foot: Secondary | ICD-10-CM | POA: Diagnosis not present

## 2017-06-01 DIAGNOSIS — M779 Enthesopathy, unspecified: Secondary | ICD-10-CM | POA: Diagnosis not present

## 2017-06-07 DIAGNOSIS — M9901 Segmental and somatic dysfunction of cervical region: Secondary | ICD-10-CM | POA: Diagnosis not present

## 2017-06-07 DIAGNOSIS — M5033 Other cervical disc degeneration, cervicothoracic region: Secondary | ICD-10-CM | POA: Diagnosis not present

## 2017-06-15 DIAGNOSIS — M9901 Segmental and somatic dysfunction of cervical region: Secondary | ICD-10-CM | POA: Diagnosis not present

## 2017-06-15 DIAGNOSIS — M5033 Other cervical disc degeneration, cervicothoracic region: Secondary | ICD-10-CM | POA: Diagnosis not present

## 2017-06-19 DIAGNOSIS — E113313 Type 2 diabetes mellitus with moderate nonproliferative diabetic retinopathy with macular edema, bilateral: Secondary | ICD-10-CM | POA: Diagnosis not present

## 2017-06-19 DIAGNOSIS — H179 Unspecified corneal scar and opacity: Secondary | ICD-10-CM | POA: Diagnosis not present

## 2017-06-19 DIAGNOSIS — H2513 Age-related nuclear cataract, bilateral: Secondary | ICD-10-CM | POA: Diagnosis not present

## 2017-06-22 DIAGNOSIS — M79672 Pain in left foot: Secondary | ICD-10-CM | POA: Diagnosis not present

## 2017-06-22 DIAGNOSIS — M779 Enthesopathy, unspecified: Secondary | ICD-10-CM | POA: Diagnosis not present

## 2017-06-22 DIAGNOSIS — M9901 Segmental and somatic dysfunction of cervical region: Secondary | ICD-10-CM | POA: Diagnosis not present

## 2017-06-22 DIAGNOSIS — M5033 Other cervical disc degeneration, cervicothoracic region: Secondary | ICD-10-CM | POA: Diagnosis not present

## 2017-06-22 DIAGNOSIS — M2010 Hallux valgus (acquired), unspecified foot: Secondary | ICD-10-CM | POA: Diagnosis not present

## 2017-06-23 DIAGNOSIS — E1165 Type 2 diabetes mellitus with hyperglycemia: Secondary | ICD-10-CM | POA: Diagnosis not present

## 2017-06-23 DIAGNOSIS — E7800 Pure hypercholesterolemia, unspecified: Secondary | ICD-10-CM | POA: Diagnosis not present

## 2017-06-23 DIAGNOSIS — E039 Hypothyroidism, unspecified: Secondary | ICD-10-CM | POA: Diagnosis not present

## 2017-06-23 DIAGNOSIS — I10 Essential (primary) hypertension: Secondary | ICD-10-CM | POA: Diagnosis not present

## 2017-06-28 DIAGNOSIS — M9901 Segmental and somatic dysfunction of cervical region: Secondary | ICD-10-CM | POA: Diagnosis not present

## 2017-06-28 DIAGNOSIS — M5033 Other cervical disc degeneration, cervicothoracic region: Secondary | ICD-10-CM | POA: Diagnosis not present

## 2017-07-05 DIAGNOSIS — M5033 Other cervical disc degeneration, cervicothoracic region: Secondary | ICD-10-CM | POA: Diagnosis not present

## 2017-07-05 DIAGNOSIS — M9901 Segmental and somatic dysfunction of cervical region: Secondary | ICD-10-CM | POA: Diagnosis not present

## 2017-07-12 DIAGNOSIS — M5033 Other cervical disc degeneration, cervicothoracic region: Secondary | ICD-10-CM | POA: Diagnosis not present

## 2017-07-12 DIAGNOSIS — M9901 Segmental and somatic dysfunction of cervical region: Secondary | ICD-10-CM | POA: Diagnosis not present

## 2017-07-20 DIAGNOSIS — E113313 Type 2 diabetes mellitus with moderate nonproliferative diabetic retinopathy with macular edema, bilateral: Secondary | ICD-10-CM | POA: Diagnosis not present

## 2017-07-20 DIAGNOSIS — H179 Unspecified corneal scar and opacity: Secondary | ICD-10-CM | POA: Diagnosis not present

## 2017-07-20 DIAGNOSIS — H2513 Age-related nuclear cataract, bilateral: Secondary | ICD-10-CM | POA: Diagnosis not present

## 2017-07-26 DIAGNOSIS — M9901 Segmental and somatic dysfunction of cervical region: Secondary | ICD-10-CM | POA: Diagnosis not present

## 2017-07-26 DIAGNOSIS — M5033 Other cervical disc degeneration, cervicothoracic region: Secondary | ICD-10-CM | POA: Diagnosis not present

## 2017-08-01 ENCOUNTER — Other Ambulatory Visit: Payer: Self-pay | Admitting: Cardiology

## 2017-08-03 DIAGNOSIS — M9901 Segmental and somatic dysfunction of cervical region: Secondary | ICD-10-CM | POA: Diagnosis not present

## 2017-08-03 DIAGNOSIS — M5033 Other cervical disc degeneration, cervicothoracic region: Secondary | ICD-10-CM | POA: Diagnosis not present

## 2017-08-10 DIAGNOSIS — M5033 Other cervical disc degeneration, cervicothoracic region: Secondary | ICD-10-CM | POA: Diagnosis not present

## 2017-08-10 DIAGNOSIS — M9901 Segmental and somatic dysfunction of cervical region: Secondary | ICD-10-CM | POA: Diagnosis not present

## 2017-08-17 DIAGNOSIS — M9901 Segmental and somatic dysfunction of cervical region: Secondary | ICD-10-CM | POA: Diagnosis not present

## 2017-08-17 DIAGNOSIS — M5033 Other cervical disc degeneration, cervicothoracic region: Secondary | ICD-10-CM | POA: Diagnosis not present

## 2017-08-24 DIAGNOSIS — M5033 Other cervical disc degeneration, cervicothoracic region: Secondary | ICD-10-CM | POA: Diagnosis not present

## 2017-08-24 DIAGNOSIS — E113313 Type 2 diabetes mellitus with moderate nonproliferative diabetic retinopathy with macular edema, bilateral: Secondary | ICD-10-CM | POA: Diagnosis not present

## 2017-08-24 DIAGNOSIS — H179 Unspecified corneal scar and opacity: Secondary | ICD-10-CM | POA: Diagnosis not present

## 2017-08-24 DIAGNOSIS — H2513 Age-related nuclear cataract, bilateral: Secondary | ICD-10-CM | POA: Diagnosis not present

## 2017-08-24 DIAGNOSIS — M9901 Segmental and somatic dysfunction of cervical region: Secondary | ICD-10-CM | POA: Diagnosis not present

## 2017-08-31 DIAGNOSIS — M9901 Segmental and somatic dysfunction of cervical region: Secondary | ICD-10-CM | POA: Diagnosis not present

## 2017-08-31 DIAGNOSIS — M5033 Other cervical disc degeneration, cervicothoracic region: Secondary | ICD-10-CM | POA: Diagnosis not present

## 2017-09-07 DIAGNOSIS — M5033 Other cervical disc degeneration, cervicothoracic region: Secondary | ICD-10-CM | POA: Diagnosis not present

## 2017-09-07 DIAGNOSIS — M9901 Segmental and somatic dysfunction of cervical region: Secondary | ICD-10-CM | POA: Diagnosis not present

## 2017-09-20 DIAGNOSIS — M5033 Other cervical disc degeneration, cervicothoracic region: Secondary | ICD-10-CM | POA: Diagnosis not present

## 2017-09-20 DIAGNOSIS — M9901 Segmental and somatic dysfunction of cervical region: Secondary | ICD-10-CM | POA: Diagnosis not present

## 2017-09-25 DIAGNOSIS — H2513 Age-related nuclear cataract, bilateral: Secondary | ICD-10-CM | POA: Diagnosis not present

## 2017-09-25 DIAGNOSIS — H179 Unspecified corneal scar and opacity: Secondary | ICD-10-CM | POA: Diagnosis not present

## 2017-09-25 DIAGNOSIS — E113313 Type 2 diabetes mellitus with moderate nonproliferative diabetic retinopathy with macular edema, bilateral: Secondary | ICD-10-CM | POA: Diagnosis not present

## 2017-09-27 DIAGNOSIS — M9901 Segmental and somatic dysfunction of cervical region: Secondary | ICD-10-CM | POA: Diagnosis not present

## 2017-09-27 DIAGNOSIS — M5033 Other cervical disc degeneration, cervicothoracic region: Secondary | ICD-10-CM | POA: Diagnosis not present

## 2017-10-04 DIAGNOSIS — M5033 Other cervical disc degeneration, cervicothoracic region: Secondary | ICD-10-CM | POA: Diagnosis not present

## 2017-10-04 DIAGNOSIS — M9901 Segmental and somatic dysfunction of cervical region: Secondary | ICD-10-CM | POA: Diagnosis not present

## 2017-10-12 DIAGNOSIS — M5033 Other cervical disc degeneration, cervicothoracic region: Secondary | ICD-10-CM | POA: Diagnosis not present

## 2017-10-12 DIAGNOSIS — M9901 Segmental and somatic dysfunction of cervical region: Secondary | ICD-10-CM | POA: Diagnosis not present

## 2017-10-19 DIAGNOSIS — M5033 Other cervical disc degeneration, cervicothoracic region: Secondary | ICD-10-CM | POA: Diagnosis not present

## 2017-10-19 DIAGNOSIS — M9901 Segmental and somatic dysfunction of cervical region: Secondary | ICD-10-CM | POA: Diagnosis not present

## 2017-10-25 DIAGNOSIS — M9901 Segmental and somatic dysfunction of cervical region: Secondary | ICD-10-CM | POA: Diagnosis not present

## 2017-10-25 DIAGNOSIS — M5033 Other cervical disc degeneration, cervicothoracic region: Secondary | ICD-10-CM | POA: Diagnosis not present

## 2017-10-26 DIAGNOSIS — H179 Unspecified corneal scar and opacity: Secondary | ICD-10-CM | POA: Diagnosis not present

## 2017-10-26 DIAGNOSIS — H2513 Age-related nuclear cataract, bilateral: Secondary | ICD-10-CM | POA: Diagnosis not present

## 2017-10-26 DIAGNOSIS — E113313 Type 2 diabetes mellitus with moderate nonproliferative diabetic retinopathy with macular edema, bilateral: Secondary | ICD-10-CM | POA: Diagnosis not present

## 2017-11-01 DIAGNOSIS — E039 Hypothyroidism, unspecified: Secondary | ICD-10-CM | POA: Diagnosis not present

## 2017-11-01 DIAGNOSIS — E1165 Type 2 diabetes mellitus with hyperglycemia: Secondary | ICD-10-CM | POA: Diagnosis not present

## 2017-11-01 DIAGNOSIS — E7800 Pure hypercholesterolemia, unspecified: Secondary | ICD-10-CM | POA: Diagnosis not present

## 2017-11-02 DIAGNOSIS — M9901 Segmental and somatic dysfunction of cervical region: Secondary | ICD-10-CM | POA: Diagnosis not present

## 2017-11-02 DIAGNOSIS — M5033 Other cervical disc degeneration, cervicothoracic region: Secondary | ICD-10-CM | POA: Diagnosis not present

## 2017-11-08 DIAGNOSIS — M9901 Segmental and somatic dysfunction of cervical region: Secondary | ICD-10-CM | POA: Diagnosis not present

## 2017-11-08 DIAGNOSIS — M5033 Other cervical disc degeneration, cervicothoracic region: Secondary | ICD-10-CM | POA: Diagnosis not present

## 2017-11-09 DIAGNOSIS — E1165 Type 2 diabetes mellitus with hyperglycemia: Secondary | ICD-10-CM | POA: Diagnosis not present

## 2017-11-09 DIAGNOSIS — E039 Hypothyroidism, unspecified: Secondary | ICD-10-CM | POA: Diagnosis not present

## 2017-11-09 DIAGNOSIS — E7800 Pure hypercholesterolemia, unspecified: Secondary | ICD-10-CM | POA: Diagnosis not present

## 2017-11-09 DIAGNOSIS — I10 Essential (primary) hypertension: Secondary | ICD-10-CM | POA: Diagnosis not present

## 2017-11-16 DIAGNOSIS — M9901 Segmental and somatic dysfunction of cervical region: Secondary | ICD-10-CM | POA: Diagnosis not present

## 2017-11-16 DIAGNOSIS — M5033 Other cervical disc degeneration, cervicothoracic region: Secondary | ICD-10-CM | POA: Diagnosis not present

## 2017-11-22 DIAGNOSIS — M9901 Segmental and somatic dysfunction of cervical region: Secondary | ICD-10-CM | POA: Diagnosis not present

## 2017-11-22 DIAGNOSIS — M5033 Other cervical disc degeneration, cervicothoracic region: Secondary | ICD-10-CM | POA: Diagnosis not present

## 2017-11-27 DIAGNOSIS — H179 Unspecified corneal scar and opacity: Secondary | ICD-10-CM | POA: Diagnosis not present

## 2017-11-27 DIAGNOSIS — E113313 Type 2 diabetes mellitus with moderate nonproliferative diabetic retinopathy with macular edema, bilateral: Secondary | ICD-10-CM | POA: Diagnosis not present

## 2017-11-27 DIAGNOSIS — H2513 Age-related nuclear cataract, bilateral: Secondary | ICD-10-CM | POA: Diagnosis not present

## 2017-11-30 DIAGNOSIS — M5033 Other cervical disc degeneration, cervicothoracic region: Secondary | ICD-10-CM | POA: Diagnosis not present

## 2017-11-30 DIAGNOSIS — M9901 Segmental and somatic dysfunction of cervical region: Secondary | ICD-10-CM | POA: Diagnosis not present

## 2017-12-05 DIAGNOSIS — M9901 Segmental and somatic dysfunction of cervical region: Secondary | ICD-10-CM | POA: Diagnosis not present

## 2017-12-05 DIAGNOSIS — M5033 Other cervical disc degeneration, cervicothoracic region: Secondary | ICD-10-CM | POA: Diagnosis not present

## 2017-12-14 DIAGNOSIS — M5033 Other cervical disc degeneration, cervicothoracic region: Secondary | ICD-10-CM | POA: Diagnosis not present

## 2017-12-14 DIAGNOSIS — M9901 Segmental and somatic dysfunction of cervical region: Secondary | ICD-10-CM | POA: Diagnosis not present

## 2017-12-21 DIAGNOSIS — M5033 Other cervical disc degeneration, cervicothoracic region: Secondary | ICD-10-CM | POA: Diagnosis not present

## 2017-12-21 DIAGNOSIS — M9901 Segmental and somatic dysfunction of cervical region: Secondary | ICD-10-CM | POA: Diagnosis not present

## 2017-12-27 DIAGNOSIS — M9901 Segmental and somatic dysfunction of cervical region: Secondary | ICD-10-CM | POA: Diagnosis not present

## 2017-12-27 DIAGNOSIS — H179 Unspecified corneal scar and opacity: Secondary | ICD-10-CM | POA: Diagnosis not present

## 2017-12-27 DIAGNOSIS — M5033 Other cervical disc degeneration, cervicothoracic region: Secondary | ICD-10-CM | POA: Diagnosis not present

## 2017-12-27 DIAGNOSIS — H2513 Age-related nuclear cataract, bilateral: Secondary | ICD-10-CM | POA: Diagnosis not present

## 2017-12-27 DIAGNOSIS — E113313 Type 2 diabetes mellitus with moderate nonproliferative diabetic retinopathy with macular edema, bilateral: Secondary | ICD-10-CM | POA: Diagnosis not present

## 2017-12-27 DIAGNOSIS — Z794 Long term (current) use of insulin: Secondary | ICD-10-CM | POA: Diagnosis not present

## 2018-01-01 ENCOUNTER — Encounter: Payer: Self-pay | Admitting: *Deleted

## 2018-01-01 ENCOUNTER — Ambulatory Visit: Payer: BLUE CROSS/BLUE SHIELD | Admitting: Cardiology

## 2018-01-01 VITALS — BP 120/70 | HR 82 | Ht 64.0 in | Wt 236.0 lb

## 2018-01-01 DIAGNOSIS — E782 Mixed hyperlipidemia: Secondary | ICD-10-CM

## 2018-01-01 DIAGNOSIS — I10 Essential (primary) hypertension: Secondary | ICD-10-CM | POA: Diagnosis not present

## 2018-01-01 DIAGNOSIS — Z8249 Family history of ischemic heart disease and other diseases of the circulatory system: Secondary | ICD-10-CM | POA: Diagnosis not present

## 2018-01-01 NOTE — Patient Instructions (Addendum)
Your physician wants you to follow-up in: 1 year with Dr.Branch You will receive a reminder letter in the mail two months in advance. If you don't receive a letter, please call our office to schedule the follow-up appointment.    Your physician recommends that you continue on your current medications as directed. Please refer to the Current Medication list given to you today.    If you need a refill on your cardiac medications before your next appointment, please call your pharmacy.     No lab work or tests ordered today.      Thank you for choosing [facility] !

## 2018-01-01 NOTE — Progress Notes (Signed)
Clinical Summary Ms. Carder is a 62 y.o.female seen today for follow up of the following medical problems.  1. Family history of CAD - normal stress myoview in 2009 - remote history of cath when seeing Dr Verl Blalock, no significant CAD  - no recent chest pain, no SOB or DOE.   2. HTN  - compliant with meds  3. Hyperlipidemia - compliant with statin  4. DM2 - followed by pcp  5. LE edema - has prn lasix, has not reqruied in quite some time.    SH: sister [name] who was pervious patient of mine who passed away 11-24-15. Her husband Kynsie Falkner is also a patient of mine.  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 Allergic rhinitis, cause unspecified   . Coronary atherosclerosis of unspecified type of vessel, native or graft   . Hypothyroidism   . Proteinuria   . Pure hyperglyceridemia   . Type II or unspecified type diabetes mellitus without mention of complication, not stated as uncontrolled   . Unspecified essential hypertension      Allergies  Allergen Reactions  . Invokana [Canagliflozin] Other (See Comments)    Skin peeling  . Janumet [Sitagliptin-Metformin Hcl] Other (See Comments)    Skin peeling  . Penicillins     REACTION: rash, edema  . Prednisone      Current Outpatient Medications  Medication Sig Dispense Refill  . aspirin 81 MG tablet Take 81 mg by mouth daily.      Marland Kitchen atorvastatin (LIPITOR) 80 MG tablet TAKE 1 TABLET DAILY 90 tablet 3  . furosemide (LASIX) 20 MG tablet TAKE 1 TABLET AS NEEDED 90 tablet 1  . insulin lispro protamine-lispro (HUMALOG 75/25 MIX) (75-25) 100 UNIT/ML SUSP injection Inject 80 Units into the skin 2 (two) times daily with a meal.    . levothyroxine (SYNTHROID, LEVOTHROID) 100 MCG tablet Take 100 mcg by mouth daily before breakfast.    . lisinopril (PRINIVIL,ZESTRIL) 20 MG tablet TAKE 1 TABLET DAILY 90  tablet 3  . metFORMIN (GLUCOPHAGE-XR) 500 MG 24 hr tablet Take 500 mg by mouth 2 (two) times daily after a meal.     . Probiotic Product (ALIGN PO) Take by mouth daily.    . ranitidine (ZANTAC) 150 MG tablet Take 150 mg by mouth daily.     No current facility-administered medications for this visit.      Past Surgical History:  Procedure Laterality Date  . CYSTECTOMY  1989   removed from end of spine  . REFRACTIVE SURGERY  1994     Allergies  Allergen Reactions  . Invokana [Canagliflozin] Other (See Comments)    Skin peeling  . Janumet [Sitagliptin-Metformin Hcl] Other (See Comments)    Skin peeling  . Penicillins     REACTION: rash, edema  . Prednisone       Family History  Problem Relation Age of Onset  . COPD Mother 62  . Heart disease Father        and heart problems (unspecified0  . Cancer Father 4  . Coronary artery disease Unknown   . Diabetes Unknown   . Obesity Unknown   . Heart attack Brother 17  . Coronary artery disease Sister   . Hyperlipidemia Unknown   . Hypertension Unknown      Social History Ms. Salm reports that  has never smoked. she has never used smokeless tobacco. Ms. Longstreth reports that she does not drink alcohol.   Review of Systems CONSTITUTIONAL: No weight loss, fever, chills, weakness or fatigue.  HEENT: Eyes: No visual loss, blurred vision, double vision or yellow sclerae.No hearing loss, sneezing, congestion, runny nose or sore throat.  SKIN: No rash or itching.  CARDIOVASCULAR: per hpi RESPIRATORY: No shortness of breath, cough or sputum.  GASTROINTESTINAL: No anorexia, nausea, vomiting or diarrhea. No abdominal pain or blood.  GENITOURINARY: No burning on urination, no polyuria NEUROLOGICAL: No headache, dizziness, syncope, paralysis, ataxia, numbness or tingling in the extremities. No change in bowel or bladder control.  MUSCULOSKELETAL: No muscle, back pain, joint pain or stiffness.  LYMPHATICS: No enlarged nodes. No  history of splenectomy.  PSYCHIATRIC: No history of depression or anxiety.  ENDOCRINOLOGIC: No reports of sweating, cold or heat intolerance. No polyuria or polydipsia.  Marland Kitchen   Physical Examination Vitals:   01/01/18 1349  BP: 120/70  Pulse: 82  SpO2: 99%   Vitals:   01/01/18 1349  Weight: 236 lb (107 kg)  Height: 5\' 4"  (1.626 m)    Gen: resting comfortably, no acute distress HEENT: no scleral icterus, pupils equal round and reactive, no palptable cervical adenopathy,  CV: RRR, no m/r/g, no jvd Resp: Clear to auscultation bilaterally GI: abdomen is soft, non-tender, non-distended, normal bowel sounds, no hepatosplenomegaly MSK: extremities are warm, no edema.  Skin: warm, no rash Neuro:  no focal deficits Psych: appropriate affect   Diagnostic Studies     Assessment and Plan  1. Family history of CAD - strong family history of CAD. She has no known history, and has had negative cath and prior stress testing - no symptoms, continue risk factor modification.  EKG today shows SR no ischemic changes.    2. HTN - bp at goal, continue current meds  3. Hyperlipidemia - continue statin, request labs from pcp  4. Lower extremity edema - doing well, has lasix to take as needed.    F/u 6 months      Arnoldo Lenis, M.D.

## 2018-01-03 ENCOUNTER — Encounter: Payer: Self-pay | Admitting: Cardiology

## 2018-01-29 DIAGNOSIS — H179 Unspecified corneal scar and opacity: Secondary | ICD-10-CM | POA: Diagnosis not present

## 2018-01-29 DIAGNOSIS — E113313 Type 2 diabetes mellitus with moderate nonproliferative diabetic retinopathy with macular edema, bilateral: Secondary | ICD-10-CM | POA: Diagnosis not present

## 2018-01-29 DIAGNOSIS — H2513 Age-related nuclear cataract, bilateral: Secondary | ICD-10-CM | POA: Diagnosis not present

## 2018-02-01 DIAGNOSIS — H2513 Age-related nuclear cataract, bilateral: Secondary | ICD-10-CM | POA: Diagnosis not present

## 2018-02-01 DIAGNOSIS — E113313 Type 2 diabetes mellitus with moderate nonproliferative diabetic retinopathy with macular edema, bilateral: Secondary | ICD-10-CM | POA: Diagnosis not present

## 2018-02-01 DIAGNOSIS — H209 Unspecified iridocyclitis: Secondary | ICD-10-CM | POA: Diagnosis not present

## 2018-02-01 DIAGNOSIS — H179 Unspecified corneal scar and opacity: Secondary | ICD-10-CM | POA: Diagnosis not present

## 2018-02-01 DIAGNOSIS — H44001 Unspecified purulent endophthalmitis, right eye: Secondary | ICD-10-CM | POA: Diagnosis not present

## 2018-02-07 DIAGNOSIS — E113313 Type 2 diabetes mellitus with moderate nonproliferative diabetic retinopathy with macular edema, bilateral: Secondary | ICD-10-CM | POA: Diagnosis not present

## 2018-02-07 DIAGNOSIS — H44001 Unspecified purulent endophthalmitis, right eye: Secondary | ICD-10-CM | POA: Diagnosis not present

## 2018-02-12 DIAGNOSIS — H44001 Unspecified purulent endophthalmitis, right eye: Secondary | ICD-10-CM | POA: Diagnosis not present

## 2018-02-13 DIAGNOSIS — E039 Hypothyroidism, unspecified: Secondary | ICD-10-CM | POA: Diagnosis not present

## 2018-02-13 DIAGNOSIS — Z6841 Body Mass Index (BMI) 40.0 and over, adult: Secondary | ICD-10-CM | POA: Diagnosis not present

## 2018-02-13 DIAGNOSIS — H43391 Other vitreous opacities, right eye: Secondary | ICD-10-CM | POA: Diagnosis not present

## 2018-02-13 DIAGNOSIS — H44001 Unspecified purulent endophthalmitis, right eye: Secondary | ICD-10-CM | POA: Diagnosis not present

## 2018-02-13 DIAGNOSIS — Z888 Allergy status to other drugs, medicaments and biological substances status: Secondary | ICD-10-CM | POA: Diagnosis not present

## 2018-02-13 DIAGNOSIS — Z79899 Other long term (current) drug therapy: Secondary | ICD-10-CM | POA: Diagnosis not present

## 2018-02-13 DIAGNOSIS — Z794 Long term (current) use of insulin: Secondary | ICD-10-CM | POA: Diagnosis not present

## 2018-02-13 DIAGNOSIS — Z880 Allergy status to penicillin: Secondary | ICD-10-CM | POA: Diagnosis not present

## 2018-02-13 DIAGNOSIS — H4419 Other endophthalmitis: Secondary | ICD-10-CM | POA: Diagnosis not present

## 2018-03-01 DIAGNOSIS — M9901 Segmental and somatic dysfunction of cervical region: Secondary | ICD-10-CM | POA: Diagnosis not present

## 2018-03-01 DIAGNOSIS — M5033 Other cervical disc degeneration, cervicothoracic region: Secondary | ICD-10-CM | POA: Diagnosis not present

## 2018-03-05 DIAGNOSIS — H33012 Retinal detachment with single break, left eye: Secondary | ICD-10-CM | POA: Diagnosis not present

## 2018-03-05 DIAGNOSIS — H44001 Unspecified purulent endophthalmitis, right eye: Secondary | ICD-10-CM | POA: Diagnosis not present

## 2018-03-07 DIAGNOSIS — M5033 Other cervical disc degeneration, cervicothoracic region: Secondary | ICD-10-CM | POA: Diagnosis not present

## 2018-03-07 DIAGNOSIS — M9901 Segmental and somatic dysfunction of cervical region: Secondary | ICD-10-CM | POA: Diagnosis not present

## 2018-03-15 DIAGNOSIS — E1165 Type 2 diabetes mellitus with hyperglycemia: Secondary | ICD-10-CM | POA: Diagnosis not present

## 2018-03-15 DIAGNOSIS — E7800 Pure hypercholesterolemia, unspecified: Secondary | ICD-10-CM | POA: Diagnosis not present

## 2018-03-15 DIAGNOSIS — E039 Hypothyroidism, unspecified: Secondary | ICD-10-CM | POA: Diagnosis not present

## 2018-03-15 DIAGNOSIS — I10 Essential (primary) hypertension: Secondary | ICD-10-CM | POA: Diagnosis not present

## 2018-03-28 DIAGNOSIS — M5033 Other cervical disc degeneration, cervicothoracic region: Secondary | ICD-10-CM | POA: Diagnosis not present

## 2018-03-28 DIAGNOSIS — M9901 Segmental and somatic dysfunction of cervical region: Secondary | ICD-10-CM | POA: Diagnosis not present

## 2018-04-05 DIAGNOSIS — E113313 Type 2 diabetes mellitus with moderate nonproliferative diabetic retinopathy with macular edema, bilateral: Secondary | ICD-10-CM | POA: Diagnosis not present

## 2018-04-05 DIAGNOSIS — H44001 Unspecified purulent endophthalmitis, right eye: Secondary | ICD-10-CM | POA: Diagnosis not present

## 2018-04-05 DIAGNOSIS — M5033 Other cervical disc degeneration, cervicothoracic region: Secondary | ICD-10-CM | POA: Diagnosis not present

## 2018-04-05 DIAGNOSIS — M9901 Segmental and somatic dysfunction of cervical region: Secondary | ICD-10-CM | POA: Diagnosis not present

## 2018-04-12 DIAGNOSIS — M9901 Segmental and somatic dysfunction of cervical region: Secondary | ICD-10-CM | POA: Diagnosis not present

## 2018-04-12 DIAGNOSIS — M5033 Other cervical disc degeneration, cervicothoracic region: Secondary | ICD-10-CM | POA: Diagnosis not present

## 2018-04-19 DIAGNOSIS — M5033 Other cervical disc degeneration, cervicothoracic region: Secondary | ICD-10-CM | POA: Diagnosis not present

## 2018-04-19 DIAGNOSIS — M9901 Segmental and somatic dysfunction of cervical region: Secondary | ICD-10-CM | POA: Diagnosis not present

## 2018-05-03 DIAGNOSIS — M9901 Segmental and somatic dysfunction of cervical region: Secondary | ICD-10-CM | POA: Diagnosis not present

## 2018-05-03 DIAGNOSIS — M5033 Other cervical disc degeneration, cervicothoracic region: Secondary | ICD-10-CM | POA: Diagnosis not present

## 2018-05-07 DIAGNOSIS — E113313 Type 2 diabetes mellitus with moderate nonproliferative diabetic retinopathy with macular edema, bilateral: Secondary | ICD-10-CM | POA: Diagnosis not present

## 2018-05-07 DIAGNOSIS — H33012 Retinal detachment with single break, left eye: Secondary | ICD-10-CM | POA: Diagnosis not present

## 2018-05-10 DIAGNOSIS — M5033 Other cervical disc degeneration, cervicothoracic region: Secondary | ICD-10-CM | POA: Diagnosis not present

## 2018-05-10 DIAGNOSIS — M9901 Segmental and somatic dysfunction of cervical region: Secondary | ICD-10-CM | POA: Diagnosis not present

## 2018-05-17 DIAGNOSIS — M5033 Other cervical disc degeneration, cervicothoracic region: Secondary | ICD-10-CM | POA: Diagnosis not present

## 2018-05-17 DIAGNOSIS — M9901 Segmental and somatic dysfunction of cervical region: Secondary | ICD-10-CM | POA: Diagnosis not present

## 2018-05-21 ENCOUNTER — Other Ambulatory Visit: Payer: Self-pay | Admitting: Cardiology

## 2018-05-23 DIAGNOSIS — M5033 Other cervical disc degeneration, cervicothoracic region: Secondary | ICD-10-CM | POA: Diagnosis not present

## 2018-05-23 DIAGNOSIS — M9901 Segmental and somatic dysfunction of cervical region: Secondary | ICD-10-CM | POA: Diagnosis not present

## 2018-05-28 DIAGNOSIS — D225 Melanocytic nevi of trunk: Secondary | ICD-10-CM | POA: Diagnosis not present

## 2018-05-28 DIAGNOSIS — D485 Neoplasm of uncertain behavior of skin: Secondary | ICD-10-CM | POA: Diagnosis not present

## 2018-05-28 DIAGNOSIS — Z1283 Encounter for screening for malignant neoplasm of skin: Secondary | ICD-10-CM | POA: Diagnosis not present

## 2018-05-31 DIAGNOSIS — M5033 Other cervical disc degeneration, cervicothoracic region: Secondary | ICD-10-CM | POA: Diagnosis not present

## 2018-05-31 DIAGNOSIS — M9901 Segmental and somatic dysfunction of cervical region: Secondary | ICD-10-CM | POA: Diagnosis not present

## 2018-06-04 DIAGNOSIS — H179 Unspecified corneal scar and opacity: Secondary | ICD-10-CM | POA: Diagnosis not present

## 2018-06-04 DIAGNOSIS — H33012 Retinal detachment with single break, left eye: Secondary | ICD-10-CM | POA: Diagnosis not present

## 2018-06-04 DIAGNOSIS — H209 Unspecified iridocyclitis: Secondary | ICD-10-CM | POA: Diagnosis not present

## 2018-06-04 DIAGNOSIS — E113313 Type 2 diabetes mellitus with moderate nonproliferative diabetic retinopathy with macular edema, bilateral: Secondary | ICD-10-CM | POA: Diagnosis not present

## 2018-06-06 DIAGNOSIS — M9901 Segmental and somatic dysfunction of cervical region: Secondary | ICD-10-CM | POA: Diagnosis not present

## 2018-06-06 DIAGNOSIS — M5033 Other cervical disc degeneration, cervicothoracic region: Secondary | ICD-10-CM | POA: Diagnosis not present

## 2018-06-13 DIAGNOSIS — M5033 Other cervical disc degeneration, cervicothoracic region: Secondary | ICD-10-CM | POA: Diagnosis not present

## 2018-06-13 DIAGNOSIS — M9901 Segmental and somatic dysfunction of cervical region: Secondary | ICD-10-CM | POA: Diagnosis not present

## 2018-06-15 ENCOUNTER — Other Ambulatory Visit: Payer: Self-pay

## 2018-06-15 ENCOUNTER — Emergency Department (HOSPITAL_COMMUNITY)
Admission: EM | Admit: 2018-06-15 | Discharge: 2018-06-15 | Disposition: A | Discharge status: Home / Self Care | Payer: BLUE CROSS/BLUE SHIELD | Attending: Emergency Medicine | Admitting: Emergency Medicine

## 2018-06-15 ENCOUNTER — Emergency Department (HOSPITAL_COMMUNITY): Payer: BLUE CROSS/BLUE SHIELD

## 2018-06-15 ENCOUNTER — Encounter (HOSPITAL_COMMUNITY): Payer: Self-pay | Admitting: *Deleted

## 2018-06-15 DIAGNOSIS — S80211A Abrasion, right knee, initial encounter: Secondary | ICD-10-CM

## 2018-06-15 DIAGNOSIS — Z794 Long term (current) use of insulin: Secondary | ICD-10-CM | POA: Diagnosis not present

## 2018-06-15 DIAGNOSIS — I2510 Atherosclerotic heart disease of native coronary artery without angina pectoris: Secondary | ICD-10-CM | POA: Insufficient documentation

## 2018-06-15 DIAGNOSIS — S99921A Unspecified injury of right foot, initial encounter: Secondary | ICD-10-CM | POA: Diagnosis not present

## 2018-06-15 DIAGNOSIS — Z7982 Long term (current) use of aspirin: Secondary | ICD-10-CM | POA: Diagnosis not present

## 2018-06-15 DIAGNOSIS — Z79899 Other long term (current) drug therapy: Secondary | ICD-10-CM | POA: Diagnosis not present

## 2018-06-15 DIAGNOSIS — I10 Essential (primary) hypertension: Secondary | ICD-10-CM | POA: Diagnosis not present

## 2018-06-15 DIAGNOSIS — E039 Hypothyroidism, unspecified: Secondary | ICD-10-CM | POA: Diagnosis not present

## 2018-06-15 DIAGNOSIS — E119 Type 2 diabetes mellitus without complications: Secondary | ICD-10-CM | POA: Diagnosis not present

## 2018-06-15 DIAGNOSIS — M25531 Pain in right wrist: Secondary | ICD-10-CM | POA: Diagnosis not present

## 2018-06-15 DIAGNOSIS — S99911A Unspecified injury of right ankle, initial encounter: Secondary | ICD-10-CM | POA: Diagnosis not present

## 2018-06-15 DIAGNOSIS — M7989 Other specified soft tissue disorders: Secondary | ICD-10-CM | POA: Diagnosis not present

## 2018-06-15 DIAGNOSIS — M25571 Pain in right ankle and joints of right foot: Secondary | ICD-10-CM | POA: Diagnosis not present

## 2018-06-15 DIAGNOSIS — S6991XA Unspecified injury of right wrist, hand and finger(s), initial encounter: Secondary | ICD-10-CM | POA: Diagnosis not present

## 2018-06-15 DIAGNOSIS — W19XXXA Unspecified fall, initial encounter: Secondary | ICD-10-CM

## 2018-06-15 DIAGNOSIS — M79671 Pain in right foot: Secondary | ICD-10-CM | POA: Diagnosis not present

## 2018-06-15 MED ORDER — HYDROCODONE-ACETAMINOPHEN 5-325 MG PO TABS: ORAL_TABLET | ORAL | 0 refills | Status: DC

## 2018-06-15 NOTE — ED Notes (Signed)
ED Provider at bedside. 

## 2018-06-15 NOTE — ED Triage Notes (Signed)
Pt c/o right ankle, foot and wrist pain; pt tripped and fell earlier today; pt has bruising and swelling to right ankle;

## 2018-06-15 NOTE — ED Notes (Signed)
Pt refused Sling for arm.

## 2018-06-15 NOTE — Discharge Instructions (Signed)
Take the prescription as directed.  Wash the area gently with soap and water, and pat dry, at least twice a day, and cover with a clean/dry dressing, if needed.  Change the dressing whenever it becomes wet or soiled after washing the area with soap and water and patting dry.  Apply moist heat or ice to the area(s) of discomfort, for 15 minutes at a time, several times per day for the next few days.  Do not fall asleep on a heating or ice pack. Wear the wrist splint and sling until you are seen in follow up. Wear the ankle support until you are seen in follow up. Call your regular medical doctor and the Orthopedic doctor on Monday to schedule a follow up appointment this week.  Return to the Emergency Department immediately if worsening.

## 2018-06-15 NOTE — ED Notes (Signed)
Pt returned from X Ray.

## 2018-06-15 NOTE — ED Notes (Signed)
Patient transported to X-ray 

## 2018-06-15 NOTE — ED Provider Notes (Signed)
[facility] EMERGENCY DEPARTMENT Provider Note   CSN: [id number] Arrival date & time: 06/15/18  1921     History   Chief Complaint Chief Complaint  Patient presents with  . Fall    HPI Jordan Perry is a 62 y.o. female.  HPI  Pt was seen at 2010. Per pt, c/o sudden onset and resolution of one episode of trip and fall approximately 1430 today. Pt c/o right ankle pain, right foot pain, right wrist pain. Denies hitting head, no LOC, no AMS, no neck or back pain, no CP/SOB, no abd pain, no N/V/D, no focal motor weakness, no tingling/numbness in extremities.   Past Medical History:  Diagnosis Date  . Allergic rhinitis, cause unspecified   . Coronary atherosclerosis of unspecified type of vessel, native or graft   . Hypothyroidism   . Proteinuria   . Pure hyperglyceridemia   . Type II or unspecified type diabetes mellitus without mention of complication, not stated as uncontrolled   . Unspecified essential hypertension     Patient Active Problem List   Diagnosis Date Noted  . GERD (gastroesophageal reflux disease) 10/10/2012  . Coronary artery disease 10/06/2011  . Obesity 10/06/2011  . HYPERLIPIDEMIA TYPE I / IV 06/26/2009  . LEG PAIN 06/26/2009  . DIABETES MELLITUS, TYPE II 06/22/2009  . HYPERTENSION, UNSPECIFIED 06/22/2009  . ALLERGIC RHINITIS 06/22/2009    Past Surgical History:  Procedure Laterality Date  . CYSTECTOMY  1989   removed from end of spine  . REFRACTIVE SURGERY  1994     OB History   None      Home Medications    Prior to Admission medications   Medication Sig Start Date End Date Taking? Authorizing Provider  aspirin 81 MG tablet Take 81 mg by mouth daily.     Yes [provider]  atorvastatin (LIPITOR) 80 MG tablet TAKE 1 TABLET DAILY Patient taking differently: Take 80 mg by mouth daily at 6 PM.  08/01/17  Yes Branch, Alphonse Guild, MD  furosemide (LASIX) 20 MG tablet TAKE 1 TABLET AS NEEDED Patient taking differently: Take 20 mg  by mouth daily as needed for fluid or edema.  07/15/15  Yes Herminio Commons, MD  HUMALOG KWIKPEN 100 UNIT/ML KiwkPen Inject 1-30 Units into the skin 3 (three) times daily. patuent regulated sliding scale 12/20/17  Yes [provider]  HUMULIN N KWIKPEN 100 UNIT/ML Kiwkpen Inject 70 Units into the skin 2 (two) times daily. & 65 units at night 12/20/17  Yes [provider]  lisinopril (PRINIVIL,ZESTRIL) 20 MG tablet TAKE 1 TABLET DAILY Patient taking differently: Take 20 mg by mouth daily.  05/21/18  Yes Arnoldo Lenis, MD  metFORMIN (GLUCOPHAGE-XR) 500 MG 24 hr tablet Take 500 mg by mouth 2 (two) times daily after a meal.  09/07/13  Yes [provider]  ranitidine (ZANTAC) 150 MG tablet Take 150 mg by mouth 2 (two) times daily.    Yes [provider]  SYNTHROID 125 MCG tablet Take 1 tablet by mouth daily. 11/22/17  Yes [provider]    Family History Family History  Problem Relation Age of Onset  . COPD Mother 52  . Heart disease Father        and heart problems (unspecified0  . Cancer Father 22  . Coronary artery disease Unknown   . Diabetes Unknown   . Obesity Unknown   . Heart attack Brother 18  . Coronary artery disease Sister   . Hyperlipidemia Unknown   .  Hypertension Unknown     Social History Social History   Tobacco Use  . Smoking status: Never Smoker  . Smokeless tobacco: Never Used  Substance Use Topics  . Alcohol use: No    Alcohol/week: 0.0 standard drinks  . Drug use: No     Allergies   Invokana [canagliflozin]; Janumet [sitagliptin-metformin hcl]; Penicillins; and Prednisone   Review of Systems Review of Systems ROS: Statement: All systems negative except as marked or noted in the HPI; Constitutional: Negative for fever and chills. ; ; Eyes: Negative for eye pain, redness and discharge. ; ; ENMT: Negative for ear pain, hoarseness, nasal congestion, sinus pressure and sore throat. ; ; Cardiovascular: Negative  for chest pain, palpitations, diaphoresis, dyspnea and peripheral edema. ; ; Respiratory: Negative for cough, wheezing and stridor. ; ; Gastrointestinal: Negative for nausea, vomiting, diarrhea, abdominal pain, blood in stool, hematemesis, jaundice and rectal bleeding. . ; ; Genitourinary: Negative for dysuria, flank pain and hematuria. ; ; Musculoskeletal: +right wrist, ankle, foot pain. Negative for back pain and neck pain. Negative for swelling and deformity.; ; Skin: +abrasion. Negative for pruritus, rash, blisters, bruising and skin lesion.; ; Neuro: Negative for headache, lightheadedness and neck stiffness. Negative for weakness, altered level of consciousness, altered mental status, extremity weakness, paresthesias, involuntary movement, seizure and syncope.       Physical Exam Updated Vital Signs BP (!) 110/55   Pulse 78   Temp 98.3 F (36.8 C) (Oral)   Resp 16   Ht 5\' 4"  (1.626 m)   Wt 104.3 kg   SpO2 98%   BMI 39.48 kg/m   Physical Exam 2015: Physical examination:  Nursing notes reviewed; Vital signs and O2 SAT reviewed;  Constitutional: Well developed, Well nourished, Well hydrated, In no acute distress; Head:  Normocephalic, atraumatic; Eyes: EOMI, PERRL, No scleral icterus; ENMT: Mouth and pharynx normal, Mucous membranes moist; Neck: Supple, Full range of motion, No lymphadenopathy; Cardiovascular: Regular rate and rhythm, No gallop; Respiratory: Breath sounds clear & equal bilaterally, No wheezes.  Speaking full sentences with ease, Normal respiratory effort/excursion; Chest: Nontender, Movement normal; Abdomen: Soft, Nontender, Nondistended, Normal bowel sounds; Genitourinary: No CVA tenderness; Spine:  No midline CS, TS, LS tenderness.;; Extremities: Peripheral pulses normal. NT right clavicle/shoulder/elbow. No right snuffbox tenderness.  No pain to axial thumb or 3rd MCP loading. Right forearm compartments soft, strong radial pp, brisk cap refill in fingers. Right hand NMS  intact. +TTP right dorsal mid-carpal bones and distal ulnar area with mild localized edema. No abrasions, no ecchymosis, no erythema.  Decreased ROM F/E right wrist d/t c/o pain.  +FROM right knee, including able to lift extended RLE off stretcher, and extend right lower leg against resistance.  No ligamentous laxity.  No patellar or quad tendon step-offs.  NMS intact right foot, strong pedal pp. +plantarflexion of right foot w/calf squeeze.  No palpable gap right Achilles's tendon.  No proximal fibular head tenderness. +very superficial abrasion right patellar area. No edema, erythema, warmth, ecchymosis or deformity.  +tender to palp right lateral maleolar area without edema, NMS intact right foot, strong pedal pp, LE muscle compartments soft.  No right knee tenderness. +generalized tenderness dorsal right foot without specific area of point tenderness.  No deformity, no ecchymosis, no erythema, no open wounds. No deformity, No calf tenderness, edema or asymmetry.; Neuro: AA&Ox3, Major CN grossly intact.  Speech clear. No gross focal motor or sensory deficits in extremities.; Skin: Color normal, Warm, Dry.   ED Treatments / Results  Labs (all labs ordered are listed, but only abnormal results are displayed)   EKG None  Radiology   Procedures Procedures (including critical care time)  Medications Ordered in ED Medications - No data to display   Initial Impression / Assessment and Plan / ED Course  I have reviewed the triage vital signs and the nursing notes.  Pertinent labs & imaging results that were available during my care of the patient were reviewed by me and considered in my medical decision making (see chart for details).  MDM Reviewed: previous chart, nursing note and vitals Interpretation: x-ray   Dg Wrist Complete Right Result Date: 06/15/2018 CLINICAL DATA:  Patient fell in parking lot this afternoon. Right wrist pain. EXAM: RIGHT WRIST - COMPLETE 3+ VIEW COMPARISON:   None. FINDINGS: Subtle linear lucency at the base of the radial styloid is noted without significant displacement. This could represent an age-indeterminate fracture of the radial epiphysis. 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 Soft tissue swelling is identified of the thenar eminence. IMPRESSION: 1. Subtle lucency involving the radial epiphysis, best seen on the scaphoid view has the appearance of an age-indeterminate fracture. 2. 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 3. No carpal bone fracture. Electronically Signed   By: Ashley Royalty M.D.   On: 06/15/2018 20:38   Dg Ankle Complete Right Result Date: 06/15/2018 CLINICAL DATA:  Patient became weak and fell in a parking lot this afternoon. Right ankle pain. EXAM: RIGHT ANKLE - COMPLETE 3+ VIEW COMPARISON:  None. FINDINGS: There is diffuse soft tissue swelling of the included right leg and about the malleoli. Calcifications within the soft tissues of the right leg likely related to chronic venous stasis. Ankle mortise appears intact. No fracture or joint dislocation. Prominent calcaneal enthesophytes are noted along the plantar dorsal aspect. Intact midfoot and subtalar joints. IMPRESSION: 1. Soft tissue swelling about the included right leg and ankle without fracture or joint dislocation. 2. Scattered soft tissue calcifications likely reflect chronic venous stasis. As these are not sheet like calcifications, collagen vascular disease such as dermatomyositis is believed less likely. Electronically Signed   By: Ashley Royalty M.D.   On: 06/15/2018 20:33   Dg Foot Complete Right Result Date: 06/15/2018 CLINICAL DATA:  Right foot pain after fall in parking lot today. EXAM: RIGHT FOOT COMPLETE - 3+ VIEW COMPARISON:  Ankle radiographs earlier this day. FINDINGS: There is no evidence of fracture or dislocation. Mild hammertoe deformity of the digits. Fragmentation at the Achilles tendon insertion. Plantar calcaneal spur. Mild  soft tissue edema over the dorsum of the foot of uncertain acuity. IMPRESSION: No fracture or dislocation of the right foot. Electronically Signed   By: Jeb Levering M.D.   On: 06/15/2018 21:05    2215:  XR reassuring. Splint wrist, ASO right ankle, f/u Ortho MD. Dx and testing d/w pt and family.  Questions answered.  Verb understanding, agreeable to d/c home with outpt f/u.   Final Clinical Impressions(s) / ED Diagnoses   Final diagnoses:  None    ED Discharge Orders    None       Francine Graven, DO 06/20/18 [id number]

## 2018-06-22 ENCOUNTER — Encounter: Payer: Self-pay | Admitting: Orthopedic Surgery

## 2018-06-22 ENCOUNTER — Ambulatory Visit: Payer: BLUE CROSS/BLUE SHIELD | Admitting: Orthopedic Surgery

## 2018-06-22 VITALS — BP 116/58 | HR 73 | Ht 64.0 in | Wt 244.0 lb

## 2018-06-22 DIAGNOSIS — S93401A Sprain of unspecified ligament of right ankle, initial encounter: Secondary | ICD-10-CM | POA: Diagnosis not present

## 2018-06-22 DIAGNOSIS — S63501A Unspecified sprain of right wrist, initial encounter: Secondary | ICD-10-CM | POA: Diagnosis not present

## 2018-06-22 DIAGNOSIS — IMO0001 Reserved for inherently not codable concepts without codable children: Secondary | ICD-10-CM

## 2018-06-22 NOTE — Progress Notes (Signed)
NEW PATIENT OFFICE VISI  Chief Complaint  Patient presents with  . Fall    Right wrist and foot pain after fall 06/15/18    62 year old female presents for evaluation of right wrist and right ankle pain after fall on August 23  The patient was at [employer] tripped and fell landed on her right hand and twisted her right ankle.  She did have x-rays which were negative  Pain located over the right ankle lateral side right wrist dorsal Quality dull Severity mild Duration 7 days Timing worse with weightbearing including weightbearing on the wrist with a walker context improving modifying factors bracing seems to help with the wrist but not the ankle associated symptoms swelling in both areas   Review of Systems  Constitutional: Negative for fever.  Respiratory: Negative for shortness of breath.   Cardiovascular: Negative for chest pain.  Skin: Negative for rash.  Neurological: Negative for tingling.     Past Medical History:  Diagnosis Date  . Allergic rhinitis, cause unspecified   . Arthritis   . Coronary atherosclerosis of unspecified type of vessel, native or graft   . GERD (gastroesophageal reflux disease)   . Hypothyroidism   . Proteinuria   . Pure hyperglyceridemia   . Type II or unspecified type diabetes mellitus without mention of complication, not stated as uncontrolled   . Unspecified essential hypertension     Past Surgical History:  Procedure Laterality Date  . CYSTECTOMY  1989   removed from end of spine  . REFRACTIVE SURGERY  1994    Family History  Problem Relation Age of Onset  . COPD Mother 40  . Heart disease Father        and heart problems (unspecified0  . Cancer Father 13  . Coronary artery disease Unknown   . Diabetes Unknown   . Obesity Unknown   . Heart attack Brother 45  . Coronary artery disease Sister   . Hyperlipidemia Unknown   . Hypertension Unknown    Social History   Tobacco Use  . Smoking status: Never Smoker  .  Smokeless tobacco: Never Used  Substance Use Topics  . Alcohol use: No    Alcohol/week: 0.0 standard drinks  . Drug use: No    Allergies  Allergen Reactions  . Invokana [Canagliflozin] Other (See Comments)    Skin peeling  . Janumet [Sitagliptin-Metformin Hcl] Other (See Comments)    Skin peeling  . Penicillins     Has patient had a PCN reaction causing immediate rash, facial/tongue/throat swelling, SOB or lightheadedness with hypotension: Yes-immediate rash Has patient had a PCN reaction causing severe rash involving mucus membranes or skin necrosis: Yes Has patient had a PCN reaction that required hospitalization: No Has patient had a PCN reaction occurring within the last 10 years: No If all of the above answers are "NO", then may proceed with Cephalosporin use.   . Prednisone     No outpatient medications have been marked as taking for the 06/22/18 encounter (Office Visit) with Carole Civil, MD.    BP (!) 116/58   Pulse 73   Ht 5\' 4"  (1.626 m)   Wt 244 lb (110.7 kg)   BMI 41.88 kg/m   Physical Exam  Constitutional: She is oriented to person, place, and time. She appears well-developed and well-nourished.  Neurological: She is alert and oriented to person, place, and time.  Psychiatric: She has a normal mood and affect. Judgment normal.  Vitals reviewed.   Ortho  Exam Right wrist swollen and tender especially dorsally she has full range of motion but painful flexion wrist is stable to stress test strength is normal in the hand skin is intact with some ecchymosis dorsal pulse and temperature are normal sensation is normal  Right ankle swollen and tender with normal alignment tenderness over the lateral collateral ligaments painful but full range of motion negative drawer test strength mild weakness in eversion otherwise normal skin warm dry and intact no lacerations pulse and temperature normal sensation normal  Ambulates with a walker weightbearing as tolerated  with no assistive device on the ankle wrist splint is noted   MEDICAL DECISION SECTION  Xrays were done at Outside facility  My independent reading of xrays:  First films are of the wrist no fracture or dislocation is seen Second films are noted of the ankle no fracture dislocation  Encounter Diagnoses  Name Primary?  . First degree ankle sprain, right, initial encounter Yes  . Wrist sprain, right, initial encounter     PLAN: (Rx., injectx, surgery, frx, mri/ct) Contrast baths, weightbearing as tolerated, 6-week follow-up  No orders of the defined types were placed in this encounter.   Arther Abbott, MD  06/22/2018 10:16 AM

## 2018-06-28 DIAGNOSIS — M5033 Other cervical disc degeneration, cervicothoracic region: Secondary | ICD-10-CM | POA: Diagnosis not present

## 2018-06-28 DIAGNOSIS — M9901 Segmental and somatic dysfunction of cervical region: Secondary | ICD-10-CM | POA: Diagnosis not present

## 2018-07-04 DIAGNOSIS — M9901 Segmental and somatic dysfunction of cervical region: Secondary | ICD-10-CM | POA: Diagnosis not present

## 2018-07-04 DIAGNOSIS — M5033 Other cervical disc degeneration, cervicothoracic region: Secondary | ICD-10-CM | POA: Diagnosis not present

## 2018-07-09 DIAGNOSIS — H2513 Age-related nuclear cataract, bilateral: Secondary | ICD-10-CM | POA: Diagnosis not present

## 2018-07-09 DIAGNOSIS — H179 Unspecified corneal scar and opacity: Secondary | ICD-10-CM | POA: Diagnosis not present

## 2018-07-09 DIAGNOSIS — E113313 Type 2 diabetes mellitus with moderate nonproliferative diabetic retinopathy with macular edema, bilateral: Secondary | ICD-10-CM | POA: Diagnosis not present

## 2018-07-10 DIAGNOSIS — E7800 Pure hypercholesterolemia, unspecified: Secondary | ICD-10-CM | POA: Diagnosis not present

## 2018-07-10 DIAGNOSIS — E119 Type 2 diabetes mellitus without complications: Secondary | ICD-10-CM | POA: Diagnosis not present

## 2018-07-10 DIAGNOSIS — E039 Hypothyroidism, unspecified: Secondary | ICD-10-CM | POA: Diagnosis not present

## 2018-07-10 DIAGNOSIS — E1165 Type 2 diabetes mellitus with hyperglycemia: Secondary | ICD-10-CM | POA: Diagnosis not present

## 2018-07-12 DIAGNOSIS — M9901 Segmental and somatic dysfunction of cervical region: Secondary | ICD-10-CM | POA: Diagnosis not present

## 2018-07-12 DIAGNOSIS — M5033 Other cervical disc degeneration, cervicothoracic region: Secondary | ICD-10-CM | POA: Diagnosis not present

## 2018-07-16 DIAGNOSIS — E1165 Type 2 diabetes mellitus with hyperglycemia: Secondary | ICD-10-CM | POA: Diagnosis not present

## 2018-07-16 DIAGNOSIS — I10 Essential (primary) hypertension: Secondary | ICD-10-CM | POA: Diagnosis not present

## 2018-07-16 DIAGNOSIS — E039 Hypothyroidism, unspecified: Secondary | ICD-10-CM | POA: Diagnosis not present

## 2018-07-16 DIAGNOSIS — E7800 Pure hypercholesterolemia, unspecified: Secondary | ICD-10-CM | POA: Diagnosis not present

## 2018-07-18 DIAGNOSIS — M5033 Other cervical disc degeneration, cervicothoracic region: Secondary | ICD-10-CM | POA: Diagnosis not present

## 2018-07-18 DIAGNOSIS — M9901 Segmental and somatic dysfunction of cervical region: Secondary | ICD-10-CM | POA: Diagnosis not present

## 2018-07-26 DIAGNOSIS — M9901 Segmental and somatic dysfunction of cervical region: Secondary | ICD-10-CM | POA: Diagnosis not present

## 2018-07-26 DIAGNOSIS — M5033 Other cervical disc degeneration, cervicothoracic region: Secondary | ICD-10-CM | POA: Diagnosis not present

## 2018-07-29 ENCOUNTER — Other Ambulatory Visit: Payer: Self-pay | Admitting: Cardiology

## 2018-08-02 DIAGNOSIS — M9901 Segmental and somatic dysfunction of cervical region: Secondary | ICD-10-CM | POA: Diagnosis not present

## 2018-08-02 DIAGNOSIS — M5033 Other cervical disc degeneration, cervicothoracic region: Secondary | ICD-10-CM | POA: Diagnosis not present

## 2018-08-06 ENCOUNTER — Ambulatory Visit: Payer: BLUE CROSS/BLUE SHIELD | Admitting: Orthopedic Surgery

## 2018-08-06 ENCOUNTER — Encounter: Payer: Self-pay | Admitting: Orthopedic Surgery

## 2018-08-06 VITALS — BP 115/65 | HR 80 | Ht 64.0 in | Wt 237.0 lb

## 2018-08-06 DIAGNOSIS — M19049 Primary osteoarthritis, unspecified hand: Secondary | ICD-10-CM | POA: Diagnosis not present

## 2018-08-06 DIAGNOSIS — S93409D Sprain of unspecified ligament of unspecified ankle, subsequent encounter: Secondary | ICD-10-CM

## 2018-08-06 DIAGNOSIS — S63501D Unspecified sprain of right wrist, subsequent encounter: Secondary | ICD-10-CM

## 2018-08-06 NOTE — Progress Notes (Signed)
Progress Note   Patient ID: Jordan Perry, female   DOB: 1956-01-12, 62 y.o.   MRN: [id number]   Chief Complaint  Patient presents with  . Ankle Injury    and wrist after a fall DOI 06/17/18    62 year old female sprained her ankle and wrist treated conservatively presents with new complaint of pain right thumb  She has pain at the base of her right thumb she is had it since she fell in August 25 is a dull ache it is mild it is associated with various activities including reaching behind her back she denies any problems with pinch or grip    Review of Systems  Constitutional: Negative for chills and fever.  Neurological: Negative for tingling, tremors, sensory change, focal weakness and weakness.    Past Medical History:  Diagnosis Date  . Allergic rhinitis, cause unspecified   . Arthritis   . Coronary atherosclerosis of unspecified type of vessel, native or graft   . GERD (gastroesophageal reflux disease)   . Hypothyroidism   . Proteinuria   . Pure hyperglyceridemia   . Type II or unspecified type diabetes mellitus without mention of complication, not stated as uncontrolled   . Unspecified essential hypertension      Allergies  Allergen Reactions  . Invokana [Canagliflozin] Other (See Comments)    Skin peeling  . Janumet [Sitagliptin-Metformin Hcl] Other (See Comments)    Skin peeling  . Penicillins     Has patient had a PCN reaction causing immediate rash, facial/tongue/throat swelling, SOB or lightheadedness with hypotension: Yes-immediate rash Has patient had a PCN reaction causing severe rash involving mucus membranes or skin necrosis: Yes Has patient had a PCN reaction that required hospitalization: No Has patient had a PCN reaction occurring within the last 10 years: No If all of the above answers are "NO", then may proceed with Cephalosporin use.   . Prednisone      BP 115/65   Pulse 80   Ht 5\' 4"  (1.626 m)   Wt 237 lb (107.5 kg)   BMI 40.68 kg/m    Physical Exam  Constitutional: She is oriented to person, place, and time. She appears well-developed and well-nourished.  Neurological: She is alert and oriented to person, place, and time.  Psychiatric: She has a normal mood and affect. Judgment normal.  Vitals reviewed.  Pain right thumb tender over the [facility] joint normal range of motion no instability mild discomfort with grinding test pinch strength normal skin is intact she has good pulse sensation is normal  Left thumb no tenderness full range of motion and normal pinch strength  Medical decisions:   Data  Imaging:  Personal interpretation of x-rays right wrist Prior imaging hospital for the initial wrist injury shows [place] arthritis of the right thumb   Encounter Diagnoses  Name Primary?  . Mild ankle sprain, subsequent encounter   . Right wrist sprain, subsequent encounter   . [place] arthritis Yes    PLAN:   Commend splint for 6 weeks if no improvement patient should call us back for follow-up appointment to get an injection    Arther Abbott, MD 08/06/2018 9:04 AM

## 2018-08-08 DIAGNOSIS — E113313 Type 2 diabetes mellitus with moderate nonproliferative diabetic retinopathy with macular edema, bilateral: Secondary | ICD-10-CM | POA: Diagnosis not present

## 2018-08-08 DIAGNOSIS — H2513 Age-related nuclear cataract, bilateral: Secondary | ICD-10-CM | POA: Diagnosis not present

## 2018-08-08 DIAGNOSIS — H35371 Puckering of macula, right eye: Secondary | ICD-10-CM | POA: Diagnosis not present

## 2018-08-08 DIAGNOSIS — H179 Unspecified corneal scar and opacity: Secondary | ICD-10-CM | POA: Diagnosis not present

## 2018-08-09 DIAGNOSIS — M5033 Other cervical disc degeneration, cervicothoracic region: Secondary | ICD-10-CM | POA: Diagnosis not present

## 2018-08-09 DIAGNOSIS — M9901 Segmental and somatic dysfunction of cervical region: Secondary | ICD-10-CM | POA: Diagnosis not present

## 2018-08-15 DIAGNOSIS — M5033 Other cervical disc degeneration, cervicothoracic region: Secondary | ICD-10-CM | POA: Diagnosis not present

## 2018-08-15 DIAGNOSIS — M9901 Segmental and somatic dysfunction of cervical region: Secondary | ICD-10-CM | POA: Diagnosis not present

## 2018-08-21 DIAGNOSIS — H35371 Puckering of macula, right eye: Secondary | ICD-10-CM | POA: Diagnosis not present

## 2018-08-21 DIAGNOSIS — E039 Hypothyroidism, unspecified: Secondary | ICD-10-CM | POA: Diagnosis not present

## 2018-08-21 DIAGNOSIS — Z794 Long term (current) use of insulin: Secondary | ICD-10-CM | POA: Diagnosis not present

## 2018-08-21 DIAGNOSIS — M1990 Unspecified osteoarthritis, unspecified site: Secondary | ICD-10-CM | POA: Diagnosis not present

## 2018-08-21 DIAGNOSIS — Z79899 Other long term (current) drug therapy: Secondary | ICD-10-CM | POA: Diagnosis not present

## 2018-08-21 DIAGNOSIS — Z880 Allergy status to penicillin: Secondary | ICD-10-CM | POA: Diagnosis not present

## 2018-08-21 DIAGNOSIS — K219 Gastro-esophageal reflux disease without esophagitis: Secondary | ICD-10-CM | POA: Diagnosis not present

## 2018-08-21 DIAGNOSIS — Z83518 Family history of other specified eye disorder: Secondary | ICD-10-CM | POA: Diagnosis not present

## 2018-08-21 DIAGNOSIS — E079 Disorder of thyroid, unspecified: Secondary | ICD-10-CM | POA: Diagnosis not present

## 2018-08-21 DIAGNOSIS — E113311 Type 2 diabetes mellitus with moderate nonproliferative diabetic retinopathy with macular edema, right eye: Secondary | ICD-10-CM | POA: Diagnosis not present

## 2018-08-21 DIAGNOSIS — E785 Hyperlipidemia, unspecified: Secondary | ICD-10-CM | POA: Diagnosis not present

## 2018-08-21 DIAGNOSIS — Z888 Allergy status to other drugs, medicaments and biological substances status: Secondary | ICD-10-CM | POA: Diagnosis not present

## 2018-08-22 DIAGNOSIS — M5033 Other cervical disc degeneration, cervicothoracic region: Secondary | ICD-10-CM | POA: Diagnosis not present

## 2018-08-22 DIAGNOSIS — M9901 Segmental and somatic dysfunction of cervical region: Secondary | ICD-10-CM | POA: Diagnosis not present

## 2018-09-06 DIAGNOSIS — M5033 Other cervical disc degeneration, cervicothoracic region: Secondary | ICD-10-CM | POA: Diagnosis not present

## 2018-09-06 DIAGNOSIS — M9901 Segmental and somatic dysfunction of cervical region: Secondary | ICD-10-CM | POA: Diagnosis not present

## 2018-09-10 DIAGNOSIS — E113313 Type 2 diabetes mellitus with moderate nonproliferative diabetic retinopathy with macular edema, bilateral: Secondary | ICD-10-CM | POA: Diagnosis not present

## 2018-09-13 DIAGNOSIS — J069 Acute upper respiratory infection, unspecified: Secondary | ICD-10-CM | POA: Diagnosis not present

## 2018-09-13 DIAGNOSIS — Z6841 Body Mass Index (BMI) 40.0 and over, adult: Secondary | ICD-10-CM | POA: Diagnosis not present

## 2018-09-13 DIAGNOSIS — E119 Type 2 diabetes mellitus without complications: Secondary | ICD-10-CM | POA: Diagnosis not present

## 2018-09-13 DIAGNOSIS — Z1389 Encounter for screening for other disorder: Secondary | ICD-10-CM | POA: Diagnosis not present

## 2018-09-18 DIAGNOSIS — M9901 Segmental and somatic dysfunction of cervical region: Secondary | ICD-10-CM | POA: Diagnosis not present

## 2018-09-18 DIAGNOSIS — M5033 Other cervical disc degeneration, cervicothoracic region: Secondary | ICD-10-CM | POA: Diagnosis not present

## 2018-09-19 ENCOUNTER — Encounter: Payer: Self-pay | Admitting: Internal Medicine

## 2018-09-24 DIAGNOSIS — M9901 Segmental and somatic dysfunction of cervical region: Secondary | ICD-10-CM | POA: Diagnosis not present

## 2018-09-24 DIAGNOSIS — M5033 Other cervical disc degeneration, cervicothoracic region: Secondary | ICD-10-CM | POA: Diagnosis not present

## 2018-10-03 DIAGNOSIS — M5033 Other cervical disc degeneration, cervicothoracic region: Secondary | ICD-10-CM | POA: Diagnosis not present

## 2018-10-03 DIAGNOSIS — M9901 Segmental and somatic dysfunction of cervical region: Secondary | ICD-10-CM | POA: Diagnosis not present

## 2018-10-08 DIAGNOSIS — E113313 Type 2 diabetes mellitus with moderate nonproliferative diabetic retinopathy with macular edema, bilateral: Secondary | ICD-10-CM | POA: Diagnosis not present

## 2018-10-09 ENCOUNTER — Ambulatory Visit: Payer: BLUE CROSS/BLUE SHIELD | Admitting: Student

## 2018-10-09 ENCOUNTER — Encounter: Payer: Self-pay | Admitting: Student

## 2018-10-09 VITALS — BP 116/58 | HR 71 | Ht 64.0 in | Wt 240.0 lb

## 2018-10-09 DIAGNOSIS — Z794 Long term (current) use of insulin: Secondary | ICD-10-CM

## 2018-10-09 DIAGNOSIS — R0609 Other forms of dyspnea: Secondary | ICD-10-CM

## 2018-10-09 DIAGNOSIS — IMO0001 Reserved for inherently not codable concepts without codable children: Secondary | ICD-10-CM

## 2018-10-09 DIAGNOSIS — Z8249 Family history of ischemic heart disease and other diseases of the circulatory system: Secondary | ICD-10-CM | POA: Diagnosis not present

## 2018-10-09 DIAGNOSIS — E782 Mixed hyperlipidemia: Secondary | ICD-10-CM

## 2018-10-09 DIAGNOSIS — I10 Essential (primary) hypertension: Secondary | ICD-10-CM | POA: Diagnosis not present

## 2018-10-09 DIAGNOSIS — E119 Type 2 diabetes mellitus without complications: Secondary | ICD-10-CM

## 2018-10-09 NOTE — Patient Instructions (Signed)
Medication Instructions:  Your physician recommends that you continue on your current medications as directed. Please refer to the Current Medication list given to you today.  If you need a refill on your cardiac medications before your next appointment, please call your pharmacy.   Lab work: None today If you have labs (blood work) drawn today and your tests are completely normal, you will receive your results only by: Marland Kitchen MyChart Message (if you have MyChart) OR . A paper copy in the mail If you have any lab test that is abnormal or we need to change your treatment, we will call you to review the results.  Testing/Procedures: Your physician has requested that you have a lexiscan myoview. For further information please visit [email or web address]. Please follow instruction sheet, as given.    Follow-Up: At [facility], you and your health needs are our priority.  As part of our continuing mission to provide you with exceptional heart care, we have created designated Provider Care Teams.  These Care Teams include your primary Cardiologist (physician) and Advanced Practice Providers (APPs -  Physician Assistants and Nurse Practitioners) who all work together to provide you with the care you need, when you need it. You will need a follow up appointment in 6 months.  Please call our office 2 months in advance to schedule this appointment.  You may see Carlyle Dolly, MD or one of the following Advanced Practice Providers on your designated Care Team:   Bernerd Pho, PA-C [facility]) . Ermalinda Barrios, PA-C ([place])  Any Other Special Instructions Will Be Listed Below (If Applicable). None

## 2018-10-09 NOTE — Progress Notes (Signed)
Cardiology Office Note    Date:  10/09/2018   ID:  Lillis, Nuttle 1956-03-06, MRN [id number]  PCP:  Sharilyn Sites, MD  Cardiologist: Carlyle Dolly, MD    Chief Complaint  Patient presents with  . Follow-up    dyspnea on exertion    History of Present Illness:    Jordan Perry is a 62 y.o. female with past medical history of HTN, HLD, IDDM and family history of CAD who presents to the office today for evaluation of worsening dyspnea on exertion.   She was last examined by Dr. Harl Bowie in 12/2017 and denied any recent chest pain or dyspnea at that time. No changes were made to her medication regimen and she was informed to follow-up in 6 months. In the interim, she called the office reporting worsening shortness of breath and a close follow-up appointment was arranged for further evaluation.   In talking with the patient today, she reports noticing episodes of dyspnea on exertion when climbing stairs or walking up an incline over the past few weeks. She is able to walk on a flat surface without any symptoms. She denies any associated chest pain, palpitations, dizziness, or presyncope. No recent orthopnea, PND, or lower extremity edema.  BP is overall well-controlled at 116/58 during today's visit and she reports similar readings when checked at home.  While she does not have a personal history of CAD she does report that both her mother and father required CABG in their 10's and her brother and sister also required CABG in their 52's.  Past Medical History:  Diagnosis Date  . Allergic rhinitis, cause unspecified   . Arthritis   . Coronary atherosclerosis of unspecified type of vessel, native or graft   . GERD (gastroesophageal reflux disease)   . Hypothyroidism   . Proteinuria   . Pure hyperglyceridemia   . Type II or unspecified type diabetes mellitus without mention of complication, not stated as uncontrolled   . Unspecified essential hypertension     Past  Surgical History:  Procedure Laterality Date  . CYSTECTOMY  1989   removed from end of spine  . REFRACTIVE SURGERY  1994    Current Medications: Outpatient Medications Prior to Visit  Medication Sig Dispense Refill  . aspirin 81 MG tablet Take 81 mg by mouth daily.      Marland Kitchen atorvastatin (LIPITOR) 80 MG tablet TAKE 1 TABLET DAILY 90 tablet 4  . furosemide (LASIX) 20 MG tablet TAKE 1 TABLET AS NEEDED (Patient taking differently: Take 20 mg by mouth daily as needed for fluid or edema. ) 90 tablet 1  . HUMALOG KWIKPEN 100 UNIT/ML KiwkPen Inject 1-30 Units into the skin 3 (three) times daily. patuent regulated sliding scale    . HUMULIN N KWIKPEN 100 UNIT/ML Kiwkpen Inject 70 Units into the skin 2 (two) times daily. & 65 units at night    . lisinopril (PRINIVIL,ZESTRIL) 20 MG tablet TAKE 1 TABLET DAILY (Patient taking differently: Take 20 mg by mouth daily. ) 90 tablet 3  . metFORMIN (GLUCOPHAGE-XR) 500 MG 24 hr tablet Take 500 mg by mouth 2 (two) times daily after a meal.     . SYNTHROID 125 MCG tablet Take 1 tablet by mouth daily.    Marland Kitchen HYDROcodone-acetaminophen (NORCO/VICODIN) 5-325 MG tablet 1 or 2 tabs PO q8 hours prn pain (Patient not taking: Reported on 06/22/2018) 10 tablet 0  . ranitidine (ZANTAC) 150 MG tablet Take 150 mg by mouth 2 (two) times daily.  No facility-administered medications prior to visit.      Allergies:   Invokana [canagliflozin]; Janumet [sitagliptin-metformin hcl]; Penicillins; and Prednisone   Social History   Socioeconomic History  . Marital status: Married    Spouse name: Not on file  . Number of children: 0  . Years of education: Not on file  . Highest education level: Not on file  Occupational History  . Occupation: Producer, 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[employer]    Comment: [employer], 13+ years  Social Needs  . Financial resource strain: Not on file  . Food insecurity:    Worry: Not on file    Inability: Not on file  . Transportation needs:    Medical:  Not on file    Non-medical: Not on file  Tobacco Use  . Smoking status: Never Smoker  . Smokeless tobacco: Never Used  Substance and Sexual Activity  . Alcohol use: No    Alcohol/week: 0.0 standard drinks  . Drug use: No  . Sexual activity: Not on file  Lifestyle  . Physical activity:    Days per week: Not on file    Minutes per session: Not on file  . Stress: Not on file  Relationships  . Social connections:    Talks on phone: Not on file    Gets together: Not on file    Attends religious service: Not on file    Active member of club or organization: Not on file    Attends meetings of clubs or organizations: Not on file    Relationship status: Not on file  Other Topics Concern  . Not on file  Social History Narrative  . Not on file     Family History:  The patient's family history includes COPD (age of onset: 71) in her mother; Cancer (age of onset: 58) in her father; Coronary artery disease in her sister and another family member; Diabetes in an other family member; Heart attack (age of onset: 30) in her brother; Heart disease in her father; Hyperlipidemia in an other family member; Hypertension in an other family member; Obesity in an other family member.   Review of Systems:   Please see the history of present illness.     General:  No chills, fever, night sweats or weight changes.  Cardiovascular:  No chest pain, edema, orthopnea, palpitations, paroxysmal nocturnal dyspnea. Positive for dyspnea on exertion.  Dermatological: No rash, lesions/masses Respiratory: No cough, dyspnea Urologic: No hematuria, dysuria Abdominal:   No nausea, vomiting, diarrhea, bright red blood per rectum, melena, or hematemesis Neurologic:  No visual changes, wkns, changes in mental status. All other systems reviewed and are otherwise negative except as noted above.   Physical Exam:    VS:  BP (!) 116/58   Pulse 71   Ht 5\' 4"  (1.626 m)   Wt 240 lb (108.9 kg)   SpO2 97%   BMI 41.20  kg/m    General: Well developed, overweight Caucasian female appearing in no acute distress. Head: Normocephalic, atraumatic, sclera non-icteric, no xanthomas, nares are without discharge.  Neck: No carotid bruits. JVD not elevated.  Lungs: Respirations regular and unlabored, without wheezes or rales.  Heart: Regular rate and rhythm. No S3 or S4.  No murmur, no rubs, or gallops appreciated. Abdomen: Soft, non-tender, non-distended with normoactive bowel sounds. No hepatomegaly. No rebound/guarding. No obvious abdominal masses. Msk:  Strength and tone appear normal for age. No joint deformities or effusions. Extremities: No clubbing or cyanosis. No lower extremity  edema.  Distal pedal pulses are 2+ bilaterally. Neuro: Alert and oriented X 3. Moves all extremities spontaneously. No focal deficits noted. Psych:  Responds to questions appropriately with a normal affect. Skin: No rashes or lesions noted  Wt Readings from Last 3 Encounters:  10/09/18 240 lb (108.9 kg)  08/06/18 237 lb (107.5 kg)  06/22/18 244 lb (110.7 kg)     Studies/Labs Reviewed:   EKG:  EKG is ordered today.  The ekg ordered today demonstrates normal sinus rhythm, heart rate 69, with no acute ST changes when compared to prior tracings.  Recent Labs: No results found for requested labs within last 8760 hours.   Lipid Panel    Component Value Date/Time   CHOL 117 12/10/2010 1748   TRIG 126 12/10/2010 1748   HDL 40 12/10/2010 1748   CHOLHDL 2.9 Ratio 12/10/2010 1748   VLDL 25 12/10/2010 1748   LDLCALC 52 12/10/2010 1748    Additional studies/ records that were reviewed today include:   EKG: 01/01/2018: NSR, HR 83, with no acute ST changes.   Assessment:    1. Dyspnea on exertion   2. Family history of premature CAD   3. Essential hypertension   4. Mixed hyperlipidemia      Plan:   In order of problems listed above:  1. Dyspnea on Exertion/ Family History of Premature CAD - The patient reports  worsening dyspnea on exertion over the past few weeks which occurs mostly when walking up inclines. She denies any associated chest discomfort or symptoms when walking along a flat surface. She does not appear volume overloaded by physical examination and denies any recent orthopnea, PND, or lower extremity edema. - Given her family history of significant CAD along with her personal cardiac risk factors of HTN, HLD, and Type II DM ischemic evaluation is indicated, especially in the setting of her new onset-symptoms. EKG today shows no acute ischemic changes when compared to prior tracings. Her last stress test was approximately 10+ years ago. Will plan to obtain a repeat Lexiscan Myoview for initial evaluation. She reports being unable to walk on a treadmill due to arthritis and frequent falls earlier this year.   2. HTN - BP is well controlled at 116/58 during today's visit.  Continue Lisinopril 20 mg daily.  3. HLD - Followed by PCP. FLP in 10/2017 showed total cholesterol 124, triglycerides 122, HDL 34, and LDL 66.  She remains on Atorvastatin 80 mg daily.  4. IDDM - Followed by PCP. In preparation for her stress test as outlined above, I recommended that she take half of her long-acting insulin the night before stress testing and not take her morning sliding scale insulin given her NPO status.   Medication Adjustments/Labs and Tests Ordered: Current medicines are reviewed at length with the patient today.  Concerns regarding medicines are outlined above.  Medication changes, Labs and Tests ordered today are listed in the Patient Instructions below. Patient Instructions  Medication Instructions:  Your physician recommends that you continue on your current medications as directed. Please refer to the Current Medication list given to you today.  If you need a refill on your cardiac medications before your next appointment, please call your pharmacy.   Lab work: None today If you have labs  (blood work) drawn today and your tests are completely normal, you will receive your results only by: Marland Kitchen MyChart Message (if you have MyChart) OR . A paper copy in the mail If you have any lab test that  is abnormal or we need to change your treatment, we will call you to review the results.  Testing/Procedures: Your physician has requested that you have a lexiscan myoview. For further information please visit [email or web address]. Please follow instruction sheet, as given.  Follow-Up: At [facility], you and your health needs are our priority.  As part of our continuing mission to provide you with exceptional heart care, we have created designated Provider Care Teams.  These Care Teams include your primary Cardiologist (physician) and Advanced Practice Providers (APPs -  Physician Assistants and Nurse Practitioners) who all work together to provide you with the care you need, when you need it. You will need a follow up appointment in 6 months (or sooner if stress test abnormal).  Please call our office 2 months in advance to schedule this appointment.  You may see Carlyle Dolly, MD or one of the following Advanced Practice Providers on your designated Care Team:   Bernerd Pho, PA-C [facility]) . Ermalinda Barrios, PA-C ([facility])  Any Other Special Instructions Will Be Listed Below (If Applicable). None    Signed, Erma Heritage, PA-C  10/09/2018 5:36 PM    [place]. [address] [place], [place] [address] Phone: [phone]

## 2018-10-11 ENCOUNTER — Ambulatory Visit: Payer: BLUE CROSS/BLUE SHIELD

## 2018-10-11 DIAGNOSIS — M5033 Other cervical disc degeneration, cervicothoracic region: Secondary | ICD-10-CM | POA: Diagnosis not present

## 2018-10-11 DIAGNOSIS — M9901 Segmental and somatic dysfunction of cervical region: Secondary | ICD-10-CM | POA: Diagnosis not present

## 2018-10-15 DIAGNOSIS — M5033 Other cervical disc degeneration, cervicothoracic region: Secondary | ICD-10-CM | POA: Diagnosis not present

## 2018-10-15 DIAGNOSIS — M9901 Segmental and somatic dysfunction of cervical region: Secondary | ICD-10-CM | POA: Diagnosis not present

## 2018-10-23 ENCOUNTER — Ambulatory Visit (HOSPITAL_COMMUNITY)
Admission: RE | Admit: 2018-10-23 | Discharge: 2018-10-23 | Disposition: A | Discharge status: Home / Self Care | Payer: BLUE CROSS/BLUE SHIELD | Source: Ambulatory Visit | Attending: Student | Admitting: Student

## 2018-10-23 ENCOUNTER — Encounter (HOSPITAL_COMMUNITY): Payer: Self-pay

## 2018-10-23 ENCOUNTER — Encounter (HOSPITAL_COMMUNITY): Payer: BLUE CROSS/BLUE SHIELD

## 2018-10-23 ENCOUNTER — Telehealth: Payer: Self-pay | Admitting: *Deleted

## 2018-10-23 DIAGNOSIS — Z8249 Family history of ischemic heart disease and other diseases of the circulatory system: Secondary | ICD-10-CM | POA: Diagnosis not present

## 2018-10-23 DIAGNOSIS — M9901 Segmental and somatic dysfunction of cervical region: Secondary | ICD-10-CM | POA: Diagnosis not present

## 2018-10-23 DIAGNOSIS — R0609 Other forms of dyspnea: Secondary | ICD-10-CM | POA: Diagnosis not present

## 2018-10-23 DIAGNOSIS — M5033 Other cervical disc degeneration, cervicothoracic region: Secondary | ICD-10-CM | POA: Diagnosis not present

## 2018-10-23 LAB — NM MYOCAR MULTI W/SPECT W/WALL MOTION / EF
CHL CUP NUCLEAR SRS: 0
CHL CUP RESTING HR STRESS: 63 {beats}/min
LV dias vol: 60 mL (ref 46–106)
LVSYSVOL: 24 mL
Peak HR: 85 {beats}/min
RATE: 0.64
SDS: 0
SSS: 0
TID: 1.36

## 2018-10-23 MED ORDER — REGADENOSON 0.4 MG/5ML IV SOLN
INTRAVENOUS | Status: AC
  Administered 2018-10-23: 0.4 mg via INTRAVENOUS
  Filled 2018-10-23: qty 5

## 2018-10-23 MED ORDER — TECHNETIUM TC 99M TETROFOSMIN IV KIT
30.0000 | PACK | Freq: Once | INTRAVENOUS | Status: AC | PRN
  Administered 2018-10-23: 30 via INTRAVENOUS

## 2018-10-23 MED ORDER — SODIUM CHLORIDE 0.9% FLUSH
INTRAVENOUS | Status: AC
  Administered 2018-10-23: 10 mL via INTRAVENOUS
  Filled 2018-10-23: qty 10

## 2018-10-23 MED ORDER — TECHNETIUM TC 99M TETROFOSMIN IV KIT
10.0000 | PACK | Freq: Once | INTRAVENOUS | Status: AC | PRN
  Administered 2018-10-23: 10.11 via INTRAVENOUS

## 2018-10-23 NOTE — Telephone Encounter (Signed)
-----   Message from Erma Heritage, [place] sent at 10/23/2018  4:40 PM EST ----- Please let the patient know her stress test showed normal blood flow with no evidence of significant blockage. Pumping function of the heart normal. Overall, interpreted as a low-risk study. Please forward a copy of results to Sharilyn Sites, MD. Thank you.

## 2018-10-23 NOTE — Telephone Encounter (Signed)
Called patient with test results. No answer. Left message to call back.  

## 2018-11-01 DIAGNOSIS — M9901 Segmental and somatic dysfunction of cervical region: Secondary | ICD-10-CM | POA: Diagnosis not present

## 2018-11-01 DIAGNOSIS — M5033 Other cervical disc degeneration, cervicothoracic region: Secondary | ICD-10-CM | POA: Diagnosis not present

## 2018-11-08 DIAGNOSIS — M9901 Segmental and somatic dysfunction of cervical region: Secondary | ICD-10-CM | POA: Diagnosis not present

## 2018-11-08 DIAGNOSIS — M5033 Other cervical disc degeneration, cervicothoracic region: Secondary | ICD-10-CM | POA: Diagnosis not present

## 2018-11-20 DIAGNOSIS — I10 Essential (primary) hypertension: Secondary | ICD-10-CM | POA: Diagnosis not present

## 2018-11-20 DIAGNOSIS — E1165 Type 2 diabetes mellitus with hyperglycemia: Secondary | ICD-10-CM | POA: Diagnosis not present

## 2018-11-20 DIAGNOSIS — R809 Proteinuria, unspecified: Secondary | ICD-10-CM | POA: Diagnosis not present

## 2018-11-20 DIAGNOSIS — E039 Hypothyroidism, unspecified: Secondary | ICD-10-CM | POA: Diagnosis not present

## 2018-11-21 DIAGNOSIS — M9901 Segmental and somatic dysfunction of cervical region: Secondary | ICD-10-CM | POA: Diagnosis not present

## 2018-11-21 DIAGNOSIS — M5033 Other cervical disc degeneration, cervicothoracic region: Secondary | ICD-10-CM | POA: Diagnosis not present

## 2018-11-22 ENCOUNTER — Ambulatory Visit (INDEPENDENT_AMBULATORY_CARE_PROVIDER_SITE_OTHER): Payer: BLUE CROSS/BLUE SHIELD

## 2018-11-22 DIAGNOSIS — Z1211 Encounter for screening for malignant neoplasm of colon: Secondary | ICD-10-CM

## 2018-11-22 MED ORDER — NA SULFATE-K SULFATE-MG SULF 17.5-3.13-1.6 GM/177ML PO SOLN: 1.0000 | ORAL | 0 refills | Status: DC

## 2018-11-22 NOTE — Patient Instructions (Signed)
Jordan Perry  [birth year] MRN: [id number]     Procedure Date: 12/12/18 Time to register: 9:30am Place to register: [facility] Procedure Time: 10:30am Scheduled provider: R. Garfield Cornea, MD    PREPARATION FOR COLONOSCOPY WITH SUPREP BOWEL PREP KIT  Note: Suprep Bowel Prep Kit is a split-dose (2day) regimen. Consumption of BOTH 6-ounce bottles is required for a complete prep.  Please notify us immediately if you are diabetic, take iron supplements, or if you are on Coumadin or any other blood thinners.  Please hold the following medications: I will mail you a letter with this information.                                                                                                                                                   2 DAYS BEFORE PROCEDURE:  DATE: 12/10/18   DAY: Monday Begin clear liquid diet AFTER your lunch meal. NO SOLID FOODS after this point.  1 DAY BEFORE PROCEDURE:  DATE: 12/11/18  DAY: Tuesday Continue clear liquids the entire day - NO SOLID FOOD.   Diabetic medications adjustments for today: see letter  At 6:00pm: Complete steps 1 through 4 below, using ONE (1) 6-ounce bottle, before going to bed. Step 1:  Pour ONE (1) 6-ounce bottle of SUPREP liquid into the mixing container.  Step 2:  Add cool drinking water to the 16 ounce line on the container and mix.  Note: Dilute the solution concentrate as directed prior to use. Step 3:  DRINK ALL the liquid in the container. Step 4:  You MUST drink an additional two (2) or more 16 ounce containers of water over the next one (1) hour.   Continue clear liquids.  DAY OF PROCEDURE:   DATE: 12/12/18   DAY: Wednesday If you take medications for your heart, blood pressure, or breathing, you may take these medications.  Diabetic medications adjustments for today:see letter  5 hours before your procedure at 5:30am Step 1:  Pour ONE (1) 6-ounce bottle of SUPREP liquid into the mixing container.  Step 2:   Add cool drinking water to the 16 ounce line on the container and mix.  Note: Dilute the solution concentrate as directed prior to use. Step 3:  DRINK ALL the liquid in the container. Step 4:  You MUST drink an additional two (2) or more 16 ounce containers of water over the next one (1) hour. You MUST complete the final glass of water at least 3 hours before your colonoscopy.    Nothing by mouth past 7:30am  You may take your morning medications with sip of water unless we have instructed otherwise.    Please see below for Dietary Information.  CLEAR LIQUIDS INCLUDE:  Water Jello (NOT red in color)   Ice Popsicles (NOT red in color)   Tea (sugar ok, no  milk/cream) Powdered fruit flavored drinks  Coffee (sugar ok, no milk/cream) Gatorade/ Lemonade/ Kool-Aid  (NOT red in color)   Juice: apple, white grape, white cranberry Soft drinks  Clear bullion, consomme, broth (fat free beef/chicken/vegetable)  Carbonated beverages (any kind)  Strained chicken noodle soup Hard Candy   Remember: Clear liquids are liquids that will allow you to see your fingers on the other side of a clear glass. Be sure liquids are NOT red in color, and not cloudy, but CLEAR.  DO NOT EAT OR DRINK ANY OF THE FOLLOWING:  Dairy products of any kind   Cranberry juice Tomato juice / V8 juice   Grapefruit juice Orange juice     Red grape juice  Do not eat any solid foods, including such foods as: cereal, oatmeal, yogurt, fruits, vegetables, creamed soups, eggs, bread, crackers, pureed foods in a blender, etc.   HELPFUL HINTS FOR DRINKING PREP SOLUTION:   Make sure prep is extremely cold. Mix and refrigerate the the morning of the prep. You may also put in the freezer.   You may try mixing some Crystal Light or Country Time Lemonade if you prefer. Mix in small amounts; add more if necessary.  Try drinking through a straw  Rinse mouth with water or a mouthwash between glasses, to remove after-taste.  Try sipping  on a cold beverage /ice/ popsicles between glasses of prep.  Place a piece of sugar-free hard candy in mouth between glasses.  If you become nauseated, try consuming smaller amounts, or stretch out the time between glasses. Stop for 30-60 minutes, then slowly start back drinking.     OTHER INSTRUCTIONS  You will need a responsible adult at least 61 years of age to accompany you and drive you home. This person must remain in the waiting room during your procedure. The hospital will cancel your procedure if you do not have a responsible adult with you.   1. Wear loose fitting clothing that is easily removed. 2. Leave jewelry and other valuables at home.  3. Remove all body piercing jewelry and leave at home. 4. Total time from sign-in until discharge is approximately 2-3 hours. 5. You should go home directly after your procedure and rest. You can resume normal activities the day after your procedure. 6. The day of your procedure you should not:  Drive  Make legal decisions  Operate machinery  Drink alcohol  Return to work   You may call the office (Dept: [phone]) before 5:00pm, or page the doctor on call [phone]) after 5:00pm, for further instructions, if necessary.   Insurance Information YOU WILL NEED TO CHECK WITH YOUR INSURANCE COMPANY FOR THE BENEFITS OF COVERAGE YOU HAVE FOR THIS PROCEDURE.  UNFORTUNATELY, NOT ALL INSURANCE COMPANIES HAVE BENEFITS TO COVER ALL OR PART OF THESE TYPES OF PROCEDURES.  IT IS YOUR RESPONSIBILITY TO CHECK YOUR BENEFITS, HOWEVER, WE WILL BE GLAD TO ASSIST YOU WITH ANY CODES YOUR INSURANCE COMPANY MAY NEED.    PLEASE NOTE THAT MOST INSURANCE COMPANIES WILL NOT COVER A SCREENING COLONOSCOPY FOR PEOPLE UNDER THE AGE OF 50  IF YOU HAVE BCBS INSURANCE, YOU MAY HAVE BENEFITS FOR A SCREENING COLONOSCOPY BUT IF POLYPS ARE FOUND THE DIAGNOSIS WILL CHANGE AND THEN YOU MAY HAVE A DEDUCTIBLE THAT WILL NEED TO BE MET. SO PLEASE MAKE SURE YOU CHECK  YOUR BENEFITS FOR A SCREENING COLONOSCOPY AS WELL AS A DIAGNOSTIC COLONOSCOPY.

## 2018-11-22 NOTE — Progress Notes (Signed)
Gastroenterology Pre-Procedure Review  Request Date:11/22/18 Requesting Physician: Dr.Golding- last tcs RMR 07/06/09- polypoid colonic mucosa  PATIENT REVIEW QUESTIONS: The patient responded to the following health history questions as indicated:    1. Diabetes Melitis: yes (taking invokana qam, metformin bid, Humulin N 70u bid and 65u qhs, humalog sliding scale) 2. Joint replacements in the past 12 months: no 3. Major health problems in the past 3 months: no 4. Has an artificial valve or MVP: no 5. Has a defibrillator: no 6. Has been advised in past to take antibiotics in advance of a procedure like teeth cleaning: no 7. Family history of colon cancer: no  8. Alcohol Use: no 9. History of sleep apnea: no  10. History of coronary artery or other vascular stents placed within the last 12 months: no 11. History of any prior anesthesia complications: no    MEDICATIONS & ALLERGIES:    Patient reports the following regarding taking any blood thinners:   Plavix? no Aspirin? yes (81mg ) Coumadin? no Brilinta? no Xarelto? no Eliquis? no Pradaxa? no Savaysa? no Effient? no  Patient confirms/reports the following medications:  Current Outpatient Medications  Medication Sig Dispense Refill  . aspirin 81 MG tablet Take 81 mg by mouth daily.      Marland Kitchen atorvastatin (LIPITOR) 80 MG tablet TAKE 1 TABLET DAILY 90 tablet 4  . furosemide (LASIX) 20 MG tablet TAKE 1 TABLET AS NEEDED (Patient taking differently: Take 20 mg by mouth daily as needed for fluid or edema. ) 90 tablet 1  . HUMALOG KWIKPEN 100 UNIT/ML KiwkPen Inject 1-30 Units into the skin 3 (three) times daily. patuent regulated sliding scale    . HUMULIN N KWIKPEN 100 UNIT/ML Kiwkpen Inject 70 Units into the skin 2 (two) times daily. & 65 units at night    . INVOKANA 100 MG TABS tablet daily.    Marland Kitchen lisinopril (PRINIVIL,ZESTRIL) 20 MG tablet TAKE 1 TABLET DAILY (Patient taking differently: Take 20 mg by mouth daily. ) 90 tablet 3  .  metFORMIN (GLUCOPHAGE-XR) 500 MG 24 hr tablet Take 500 mg by mouth 2 (two) times daily after a meal.     . SYNTHROID 125 MCG tablet Take 1 tablet by mouth daily.     No current facility-administered medications for this visit.     Patient confirms/reports the following allergies:  Allergies  Allergen Reactions  . Invokana [Canagliflozin] Other (See Comments)    Skin peeling  . Janumet [Sitagliptin-Metformin Hcl] Other (See Comments)    Skin peeling  . Penicillins     Has patient had a PCN reaction causing immediate rash, facial/tongue/throat swelling, SOB or lightheadedness with hypotension: Yes-immediate rash Has patient had a PCN reaction causing severe rash involving mucus membranes or skin necrosis: Yes Has patient had a PCN reaction that required hospitalization: No Has patient had a PCN reaction occurring within the last 10 years: No If all of the above answers are "NO", then may proceed with Cephalosporin use.   . Prednisone     No orders of the defined types were placed in this encounter.   AUTHORIZATION INFORMATION Primary Insurance: BCBS   ID #: [id number] Pre-Cert / Auth required:    SCHEDULE INFORMATION: Procedure has been scheduled as follows:  Date: 12/12/18, Time: 10:30 Location: [facility] Dr.Rourk  This Gastroenterology Pre-Precedure Review Form is being routed to the following provider(s): Neil Crouch, PA

## 2018-11-29 DIAGNOSIS — M5033 Other cervical disc degeneration, cervicothoracic region: Secondary | ICD-10-CM | POA: Diagnosis not present

## 2018-11-29 DIAGNOSIS — M9901 Segmental and somatic dysfunction of cervical region: Secondary | ICD-10-CM | POA: Diagnosis not present

## 2018-12-03 NOTE — Progress Notes (Signed)
Please clarify DM meds, the med list does not match the "written" in DM meds on triage template.

## 2018-12-04 DIAGNOSIS — M5033 Other cervical disc degeneration, cervicothoracic region: Secondary | ICD-10-CM | POA: Diagnosis not present

## 2018-12-04 DIAGNOSIS — M9901 Segmental and somatic dysfunction of cervical region: Secondary | ICD-10-CM | POA: Diagnosis not present

## 2018-12-05 NOTE — Progress Notes (Signed)
Tried to call pt- NA- LMOM 

## 2018-12-07 NOTE — Progress Notes (Signed)
Tried to call pt- NA- LMOM 

## 2018-12-11 ENCOUNTER — Telehealth: Payer: Self-pay | Admitting: Internal Medicine

## 2018-12-11 NOTE — Progress Notes (Signed)
See phone note about adjusting DM medications. Pt is aware of instructions.

## 2018-12-11 NOTE — Telephone Encounter (Signed)
Pt called for JL to go over her meds with her. She is scheduled procedure with RMR tomorrow. Please call [phone]

## 2018-12-11 NOTE — Telephone Encounter (Signed)
Should adjust meds today metformin 250mg  BID, Humulin N 35 Units BID, Humalog 15 units TID.

## 2018-12-11 NOTE — Progress Notes (Signed)
Spoke with the pt, she reports that she is no longer taking the Invokana, and is taking the following:  Metformin 500mg  bid Humulin N 70u bid Humalog 30u tid

## 2018-12-11 NOTE — Telephone Encounter (Signed)
Spoke with the pt, she reports that she is no longer taking the Invokana, and is taking the following:  Metformin 500mg  bid Humulin N 70u bid Humalog 30u tid

## 2018-12-11 NOTE — Telephone Encounter (Signed)
Called and informed pt.  

## 2018-12-12 ENCOUNTER — Ambulatory Visit (HOSPITAL_COMMUNITY)
Admission: RE | Admit: 2018-12-12 | Discharge: 2018-12-12 | Disposition: A | Discharge status: Still In Hospital | Payer: BLUE CROSS/BLUE SHIELD | Source: Home / Self Care | Attending: Internal Medicine | Admitting: Internal Medicine

## 2018-12-12 ENCOUNTER — Inpatient Hospital Stay (HOSPITAL_COMMUNITY): Payer: BLUE CROSS/BLUE SHIELD

## 2018-12-12 ENCOUNTER — Other Ambulatory Visit (HOSPITAL_COMMUNITY): Payer: Self-pay | Admitting: *Deleted

## 2018-12-12 ENCOUNTER — Inpatient Hospital Stay (HOSPITAL_COMMUNITY)
Admission: EM | Admit: 2018-12-12 | Discharge: 2018-12-14 | DRG: 243 | Disposition: A | Discharge status: Home / Self Care | Payer: BLUE CROSS/BLUE SHIELD | Attending: Internal Medicine | Admitting: Internal Medicine

## 2018-12-12 ENCOUNTER — Encounter (HOSPITAL_COMMUNITY): Payer: Self-pay | Admitting: *Deleted

## 2018-12-12 ENCOUNTER — Other Ambulatory Visit: Payer: Self-pay

## 2018-12-12 ENCOUNTER — Encounter (HOSPITAL_COMMUNITY)
Admission: RE | Disposition: A | Discharge status: Still In Hospital | Payer: Self-pay | Source: Home / Self Care | Attending: Internal Medicine

## 2018-12-12 ENCOUNTER — Encounter (HOSPITAL_COMMUNITY): Payer: Self-pay

## 2018-12-12 ENCOUNTER — Telehealth: Payer: Self-pay | Admitting: Cardiology

## 2018-12-12 DIAGNOSIS — E785 Hyperlipidemia, unspecified: Secondary | ICD-10-CM | POA: Diagnosis not present

## 2018-12-12 DIAGNOSIS — Z794 Long term (current) use of insulin: Secondary | ICD-10-CM

## 2018-12-12 DIAGNOSIS — Z7984 Long term (current) use of oral hypoglycemic drugs: Secondary | ICD-10-CM

## 2018-12-12 DIAGNOSIS — Z888 Allergy status to other drugs, medicaments and biological substances status: Secondary | ICD-10-CM

## 2018-12-12 DIAGNOSIS — I272 Pulmonary hypertension, unspecified: Secondary | ICD-10-CM | POA: Diagnosis present

## 2018-12-12 DIAGNOSIS — Z5309 Procedure and treatment not carried out because of other contraindication: Secondary | ICD-10-CM

## 2018-12-12 DIAGNOSIS — Z950 Presence of cardiac pacemaker: Secondary | ICD-10-CM

## 2018-12-12 DIAGNOSIS — Z79899 Other long term (current) drug therapy: Secondary | ICD-10-CM | POA: Diagnosis not present

## 2018-12-12 DIAGNOSIS — Z7982 Long term (current) use of aspirin: Secondary | ICD-10-CM | POA: Diagnosis not present

## 2018-12-12 DIAGNOSIS — Z8249 Family history of ischemic heart disease and other diseases of the circulatory system: Secondary | ICD-10-CM

## 2018-12-12 DIAGNOSIS — Z6841 Body Mass Index (BMI) 40.0 and over, adult: Secondary | ICD-10-CM | POA: Diagnosis not present

## 2018-12-12 DIAGNOSIS — E039 Hypothyroidism, unspecified: Secondary | ICD-10-CM | POA: Diagnosis not present

## 2018-12-12 DIAGNOSIS — Z833 Family history of diabetes mellitus: Secondary | ICD-10-CM | POA: Insufficient documentation

## 2018-12-12 DIAGNOSIS — I10 Essential (primary) hypertension: Secondary | ICD-10-CM

## 2018-12-12 DIAGNOSIS — K219 Gastro-esophageal reflux disease without esophagitis: Secondary | ICD-10-CM

## 2018-12-12 DIAGNOSIS — Z880 Allergy status to penicillin: Secondary | ICD-10-CM

## 2018-12-12 DIAGNOSIS — R001 Bradycardia, unspecified: Secondary | ICD-10-CM | POA: Diagnosis present

## 2018-12-12 DIAGNOSIS — R0602 Shortness of breath: Secondary | ICD-10-CM | POA: Diagnosis not present

## 2018-12-12 DIAGNOSIS — R918 Other nonspecific abnormal finding of lung field: Secondary | ICD-10-CM | POA: Diagnosis not present

## 2018-12-12 DIAGNOSIS — Z809 Family history of malignant neoplasm, unspecified: Secondary | ICD-10-CM | POA: Insufficient documentation

## 2018-12-12 DIAGNOSIS — Z79890 Hormone replacement therapy (postmenopausal): Secondary | ICD-10-CM | POA: Diagnosis not present

## 2018-12-12 DIAGNOSIS — E669 Obesity, unspecified: Secondary | ICD-10-CM | POA: Diagnosis not present

## 2018-12-12 DIAGNOSIS — Z825 Family history of asthma and other chronic lower respiratory diseases: Secondary | ICD-10-CM | POA: Insufficient documentation

## 2018-12-12 DIAGNOSIS — I442 Atrioventricular block, complete: Secondary | ICD-10-CM

## 2018-12-12 DIAGNOSIS — E119 Type 2 diabetes mellitus without complications: Secondary | ICD-10-CM | POA: Diagnosis present

## 2018-12-12 DIAGNOSIS — I2510 Atherosclerotic heart disease of native coronary artery without angina pectoris: Secondary | ICD-10-CM | POA: Diagnosis not present

## 2018-12-12 DIAGNOSIS — Z1211 Encounter for screening for malignant neoplasm of colon: Secondary | ICD-10-CM

## 2018-12-12 DIAGNOSIS — E781 Pure hyperglyceridemia: Secondary | ICD-10-CM

## 2018-12-12 DIAGNOSIS — M1990 Unspecified osteoarthritis, unspecified site: Secondary | ICD-10-CM | POA: Insufficient documentation

## 2018-12-12 LAB — CBG MONITORING, ED: Glucose-Capillary: 203 mg/dL — ABNORMAL HIGH (ref 70–99)

## 2018-12-12 LAB — COMPREHENSIVE METABOLIC PANEL
ALT: 25 U/L (ref 0–44)
AST: 15 U/L (ref 15–41)
Albumin: 3.6 g/dL (ref 3.5–5.0)
Alkaline Phosphatase: 83 U/L (ref 38–126)
Anion gap: 10 (ref 5–15)
BILIRUBIN TOTAL: 0.6 mg/dL (ref 0.3–1.2)
BUN: 16 mg/dL (ref 6–20)
CO2: 18 mmol/L — ABNORMAL LOW (ref 22–32)
Calcium: 8.8 mg/dL — ABNORMAL LOW (ref 8.9–10.3)
Chloride: 109 mmol/L (ref 98–111)
Creatinine, Ser: 0.72 mg/dL (ref 0.44–1.00)
GFR calc Af Amer: >60 mL/min (ref >60)
GFR calc non Af Amer: >60 mL/min (ref >60)
Glucose, Bld: 165 mg/dL — ABNORMAL HIGH (ref 70–99)
Potassium: 4.4 mmol/L (ref 3.5–5.1)
Sodium: 137 mmol/L (ref 135–145)
TOTAL PROTEIN: 7.1 g/dL (ref 6.5–8.1)

## 2018-12-12 LAB — CBC WITH DIFFERENTIAL/PLATELET
Abs Immature Granulocytes: 0.03 10*3/uL (ref 0.00–0.07)
Basophils Absolute: 0 10*3/uL (ref 0.0–0.1)
Basophils Relative: 0 %
Eosinophils Absolute: 0.1 10*3/uL (ref 0.0–0.5)
Eosinophils Relative: 1 %
HCT: 33.5 % — ABNORMAL LOW (ref 36.0–46.0)
Hemoglobin: 10.2 g/dL — ABNORMAL LOW (ref 12.0–15.0)
IMMATURE GRANULOCYTES: 0 %
Lymphocytes Relative: 23 %
Lymphs Abs: 2.5 10*3/uL (ref 0.7–4.0)
MCH: 28.7 pg (ref 26.0–34.0)
MCHC: 30.4 g/dL (ref 30.0–36.0)
MCV: 94.4 fL (ref 80.0–100.0)
MONOS PCT: 6 %
Monocytes Absolute: 0.6 10*3/uL (ref 0.1–1.0)
Neutro Abs: 7.7 10*3/uL (ref 1.7–7.7)
Neutrophils Relative %: 70 %
Platelets: 238 10*3/uL (ref 150–400)
RBC: 3.55 MIL/uL — ABNORMAL LOW (ref 3.87–5.11)
RDW: 14.6 % (ref 11.5–15.5)
WBC: 11 10*3/uL — ABNORMAL HIGH (ref 4.0–10.5)
nRBC: 0 % (ref 0.0–0.2)

## 2018-12-12 LAB — GLUCOSE, CAPILLARY
Glucose-Capillary: 166 mg/dL — ABNORMAL HIGH (ref 70–99)
Glucose-Capillary: 173 mg/dL — ABNORMAL HIGH (ref 70–99)

## 2018-12-12 LAB — MAGNESIUM: Magnesium: 1.9 mg/dL (ref 1.7–2.4)

## 2018-12-12 LAB — ECHOCARDIOGRAM COMPLETE
Height: 64 in
Weight: 3774.28 oz

## 2018-12-12 LAB — TSH: TSH: 2.249 u[IU]/mL (ref 0.350–4.500)

## 2018-12-12 LAB — TROPONIN I: Troponin I: <0.03 ng/mL (ref <0.03)

## 2018-12-12 SURGERY — CANCELLED PROCEDURE

## 2018-12-12 MED ORDER — MIDAZOLAM HCL 5 MG/5ML IJ SOLN
INTRAMUSCULAR | Status: AC
  Filled 2018-12-12: qty 10

## 2018-12-12 MED ORDER — ATORVASTATIN CALCIUM 80 MG PO TABS: 80.0000 mg | ORAL_TABLET | Freq: Every day | ORAL | Status: DC

## 2018-12-12 MED ORDER — INSULIN ASPART 100 UNIT/ML ~~LOC~~ SOLN
0.0000 [IU] | Freq: Three times a day (TID) | SUBCUTANEOUS | Status: DC
  Administered 2018-12-12: 5 [IU] via SUBCUTANEOUS
  Administered 2018-12-13 (×2): 3 [IU] via SUBCUTANEOUS
  Administered 2018-12-14 (×2): 5 [IU] via SUBCUTANEOUS

## 2018-12-12 MED ORDER — LEVOTHYROXINE SODIUM 25 MCG PO TABS
125.0000 ug | ORAL_TABLET | Freq: Every evening | ORAL | Status: DC
  Administered 2018-12-12: 125 ug via ORAL
  Filled 2018-12-12: qty 3

## 2018-12-12 MED ORDER — PERFLUTREN LIPID MICROSPHERE
1.0000 mL | INTRAVENOUS | Status: AC | PRN
  Administered 2018-12-12: 1 mL via INTRAVENOUS
  Administered 2018-12-12: 2 mL via INTRAVENOUS
  Filled 2018-12-12: qty 10

## 2018-12-12 MED ORDER — LISINOPRIL 20 MG PO TABS
20.0000 mg | ORAL_TABLET | Freq: Every day | ORAL | Status: DC
  Administered 2018-12-13 – 2018-12-14 (×2): 20 mg via ORAL
  Filled 2018-12-12 (×2): qty 1

## 2018-12-12 MED ORDER — ASPIRIN EC 81 MG PO TBEC
81.0000 mg | DELAYED_RELEASE_TABLET | Freq: Every day | ORAL | Status: DC
  Administered 2018-12-13 – 2018-12-14 (×2): 81 mg via ORAL
  Filled 2018-12-12 (×2): qty 1

## 2018-12-12 MED ORDER — SODIUM CHLORIDE 0.9 % IV SOLN: INTRAVENOUS | Status: DC

## 2018-12-12 MED ORDER — ENOXAPARIN SODIUM 40 MG/0.4ML ~~LOC~~ SOLN
40.0000 mg | SUBCUTANEOUS | Status: DC
  Administered 2018-12-13 – 2018-12-14 (×2): 40 mg via SUBCUTANEOUS
  Filled 2018-12-12 (×2): qty 0.4

## 2018-12-12 MED ORDER — ONDANSETRON HCL 4 MG/2ML IJ SOLN
INTRAMUSCULAR | Status: AC
  Filled 2018-12-12: qty 2

## 2018-12-12 MED ORDER — INSULIN ASPART 100 UNIT/ML ~~LOC~~ SOLN
SUBCUTANEOUS | Status: AC
  Filled 2018-12-12: qty 1

## 2018-12-12 MED ORDER — SODIUM CHLORIDE 0.9 % IV BOLUS
1000.0000 mL | Freq: Once | INTRAVENOUS | Status: AC
  Administered 2018-12-12: 1000 mL via INTRAVENOUS

## 2018-12-12 MED ORDER — MEPERIDINE HCL 100 MG/ML IJ SOLN
INTRAMUSCULAR | Status: AC
  Filled 2018-12-12: qty 1

## 2018-12-12 NOTE — Progress Notes (Signed)
*  PRELIMINARY RESULTS* Echocardiogram 2D Echocardiogram has been performed with Definity.  Samuel Germany 12/12/2018, 1:06 PM

## 2018-12-12 NOTE — ED Notes (Signed)
Cardiology at bedside.

## 2018-12-12 NOTE — ED Provider Notes (Signed)
[name] EMERGENCY DEPARTMENT Provider Note   CSN: [id number] Arrival date & time: 12/12/18  1046    History   Chief Complaint Chief Complaint  Patient presents with  . heart block  . Shortness of Breath    HPI Jordan Perry is a 63 y.o. female.     Level 5 caveat for urgent need for intervention.  Patient was scheduled for a colonoscopy this morning.  During her initial evaluation, her rhythm strip revealed third-degree heart block.  She was transferred to the emergency department for further evaluation.  No substernal chest pain, dyspnea, diaphoresis, nausea.  No previous history of same.     Past Medical History:  Diagnosis Date  . Allergic rhinitis, cause unspecified   . Arthritis   . Coronary atherosclerosis of unspecified type of vessel, native or graft   . GERD (gastroesophageal reflux disease)   . Hypothyroidism   . Proteinuria   . Pure hyperglyceridemia   . Type II or unspecified type diabetes mellitus without mention of complication, not stated as uncontrolled   . Unspecified essential hypertension     Patient Active Problem List   Diagnosis Date Noted  . Complete heart block ([place]) 12/12/2018  . GERD (gastroesophageal reflux disease) 10/10/2012  . Coronary artery disease 10/06/2011  . Obesity 10/06/2011  . HYPERLIPIDEMIA TYPE I / IV 06/26/2009  . LEG PAIN 06/26/2009  . DIABETES MELLITUS, TYPE II 06/22/2009  . HYPERTENSION, UNSPECIFIED 06/22/2009  . ALLERGIC RHINITIS 06/22/2009    Past Surgical History:  Procedure Laterality Date  . CYSTECTOMY  1989   removed from end of spine  . REFRACTIVE SURGERY  1994     OB History   No obstetric history on file.      Home Medications    Prior to Admission medications   Medication Sig Start Date End Date Taking? Authorizing Provider  acetaminophen (TYLENOL) 500 MG tablet Take 1,000-1,500 mg by mouth every 6 (six) hours as needed (for headaches.).    [provider]  aspirin EC 81 MG  tablet Take 81 mg by mouth daily.    [provider]  atorvastatin (LIPITOR) 80 MG tablet TAKE 1 TABLET DAILY Patient taking differently: Take 80 mg by mouth daily.  07/30/18   Arnoldo Lenis, MD  furosemide (LASIX) 20 MG tablet TAKE 1 TABLET AS NEEDED Patient taking differently: Take 20 mg by mouth daily as needed for fluid or edema.  07/15/15   Herminio Commons, MD  HUMALOG KWIKPEN 100 UNIT/ML KiwkPen Inject 30 Units into the skin 3 (three) times daily with meals.  12/20/17   [provider]  HUMULIN N KWIKPEN 100 UNIT/ML Kiwkpen Inject 60 Units into the skin 2 (two) times daily.  12/20/17   [provider]  lisinopril (PRINIVIL,ZESTRIL) 20 MG tablet TAKE 1 TABLET DAILY Patient taking differently: Take 20 mg by mouth daily.  05/21/18   Arnoldo Lenis, MD  metFORMIN (GLUCOPHAGE-XR) 500 MG 24 hr tablet Take 500 mg by mouth 2 (two) times daily after a meal.  09/07/13   [provider]  Na Sulfate-K Sulfate-Mg Sulf (SUPREP BOWEL PREP KIT) 17.5-3.13-1.6 GM/177ML SOLN Take 1 kit by mouth as directed. 11/22/18   Mahala Menghini, PA-C  SYNTHROID 125 MCG tablet Take 125 mcg by mouth every evening.  11/22/17   [provider]    Family History Family History  Problem Relation Age of Onset  . COPD Mother 17  . Heart disease Father   . Cancer  Father 38  . Coronary artery disease Other   . Diabetes Other   . Obesity Other   . Heart attack Brother 64  . Coronary artery disease Sister   . Hyperlipidemia Other   . Hypertension Other   . Colon cancer Neg Hx     Social History Social History   Tobacco Use  . Smoking status: Never Smoker  . Smokeless tobacco: Never Used  Substance Use Topics  . Alcohol use: No    Alcohol/week: 0.0 standard drinks  . Drug use: No     Allergies   Invokana [canagliflozin]; Janumet [sitagliptin-metformin hcl]; Penicillins; and Prednisone   Review of Systems Review of Systems  Unable to perform ROS: Acuity  of condition     Physical Exam Updated Vital Signs BP (!) 120/35   Pulse (!) 37   Temp 98.1 F (36.7 C) (Oral)   Resp 17   Ht _0  (1.626 m)   Wt 107 kg   SpO2 99%   BMI 40.49 kg/m   Physical Exam Vitals signs and nursing note reviewed.  Constitutional:      Appearance: She is well-developed.     Comments: nad  HENT:     Head: Normocephalic and atraumatic.  Eyes:     Conjunctiva/sclera: Conjunctivae normal.  Neck:     Musculoskeletal: Neck supple.  Cardiovascular:     Rate and Rhythm: Regular rhythm. Bradycardia present.  Pulmonary:     Effort: Pulmonary effort is normal.     Breath sounds: Normal breath sounds.  Abdominal:     General: Bowel sounds are normal.     Palpations: Abdomen is soft.  Musculoskeletal: Normal range of motion.  Skin:    General: Skin is warm and dry.  Neurological:     Mental Status: She is alert and oriented to person, place, and time.  Psychiatric:        Behavior: Behavior normal.      ED Treatments / Results  Labs (all labs ordered are listed, but only abnormal results are displayed) Labs Reviewed  CBC WITH DIFFERENTIAL/PLATELET - Abnormal; Notable for the following components:      Result Value   WBC 11.0 (*)    RBC 3.55 (*)    Hemoglobin 10.2 (*)    HCT 33.5 (*)    All other components within normal limits  COMPREHENSIVE METABOLIC PANEL - Abnormal; Notable for the following components:   CO2 18 (*)    Glucose, Bld 165 (*)    Calcium 8.8 (*)    All other components within normal limits  TROPONIN I  MAGNESIUM  TSH    EKG EKG Interpretation  Date/Time:  Wednesday December 12 2018 10:52:14 EST Ventricular Rate:  37 PR Interval:    QRS Duration: 113 QT Interval:  526 QTC Calculation: 413 R Axis:   99 Text Interpretation:  AV block, complete (third degree) Borderline intraventricular conduction delay Low voltage, precordial leads Confirmed by Nat Christen [phone]) on 12/12/2018 1:45:06 PM   Radiology No results  found.  Procedures Procedures (including critical care time)  Medications Ordered in ED Medications  perflutren lipid microspheres (DEFINITY) IV suspension (1 mL Intravenous Given 12/12/18 1254)  sodium chloride 0.9 % bolus 1,000 mL (0 mLs Intravenous Stopped 12/12/18 1224)     Initial Impression / Assessment and Plan / ED Course  I have reviewed the triage vital signs and the nursing notes.  Pertinent labs & imaging results that were available during my care of the patient were  reviewed by me and considered in my medical decision making (see chart for details).       History and physical consistent with third-degree heart block.  Patient is hemodynamically stable.  Will consult cardiology for admission.   Final Clinical Impressions(s) / ED Diagnoses   Final diagnoses:  Heart block AV third degree [facility])    ED Discharge Orders    None       Nat Christen, MD 12/12/18 1346

## 2018-12-12 NOTE — ED Triage Notes (Signed)
Pt brought over from day sx due to heart block. Pt scheduled for colonscopy and EKG showed complete heart block . Pt reports SOB and stress test completed in Dec

## 2018-12-12 NOTE — Progress Notes (Addendum)
Paged MD on call cardiology x2 since patient's admission at 2300. Will continue to page. Patient is stable. HR 30s without signs and symptoms. No c/o pain. Afebrile. Alert and verbalizes needs. Call light within reach. Reminded patient that she is to be NPO after midnight   0000- MD returned paged. Reported HR in the 30s, no new orders. Patient will remain NPO after midnight. Asked about consent form, etc for surgery, says that will be "taken care of tomorrow. "

## 2018-12-12 NOTE — Progress Notes (Signed)
Discussed patient with Dr Gala Romney, she presented for outpatient colonscopy. Found to be bradycardic, EKG showed complete heart block. She is asymptomatic with stable bp. I have asked she be transferred to ER, will need labs including CMET/Mg/CBC/TSH. We will see patient in Er, likely will need transfer to [facility] for EP evaluation. She is not on any av nodal agents.   Carlyle Dolly MD

## 2018-12-12 NOTE — ED Notes (Signed)
Spoke with Silas Sacramento from [place] states she spoke with Van PA and reports pacemaker will not be done until tomorrow and could eat today, but will be NPO after midnight

## 2018-12-12 NOTE — H&P (Addendum)
History & Physical    Patient ID: Jordan Perry MRN: [id number], DOB/AGE: [birth year]   Admit date: 12/12/2018  Primary Care Provider: Sharilyn Sites, MD Primary Cardiologist: Carlyle Dolly, MD   Chief Complaint: Dyspnea on Exertion; Found to be in CHB at the time of arrival for Colonoscopy  Patient Profile    Jordan Perry is a 63 y.o. female wwith past medical history of HTN, HLD, IDDM and family history of CAD who is being evaluated today for complete heart block at the request of Dr. Gala Romney.  History of Present Illness    Jordan Perry was last evaluated in clinic in 09/2018 and reported episodes of dyspnea on exertion when climbing stairs or walking up inclines over the past several weeks. EKG at that time showed NSR, HR 69, with no acute ST changes. Denied any associated symptoms and given her cardiac risk factors and family history of CAD, a Lexiscan Myoview was obtained for further evaluation.  NST showed a possible area of ischemia but was thought to be most consistent with soft tissue artifact and was overall a low risk study. EF was preserved at 60%.  She presented today for a screening colonoscopy and was found to be bradycardic upon arrival. EKG confirmed she was in third-degree heart block with heart rate of 36. Therefore, her procedure was canceled and she was transported to the Emergency Department for further work-up.  In talking with the patient and her husband today, she reports she has still been experiencing intermittent dyspnea on exertion but denies any acute symptoms at this time. No associated chest pain, palpitations, lightheadedness, dizziness, or presyncope.  Reports good compliance with her current medication regimen. Not on any AV nodal blocking agents. No new supplements.   Labs have been drawn including CBC, BMET, TSH, and Mg but results are pending at this time.   Past Medical History:  Diagnosis Date  . Allergic rhinitis, cause unspecified     . Arthritis   . Coronary atherosclerosis of unspecified type of vessel, native or graft   . GERD (gastroesophageal reflux disease)   . Hypothyroidism   . Proteinuria   . Pure hyperglyceridemia   . Type II or unspecified type diabetes mellitus without mention of complication, not stated as uncontrolled   . Unspecified essential hypertension     Past Surgical History:  Procedure Laterality Date  . CYSTECTOMY  1989   removed from end of spine  . REFRACTIVE SURGERY  1994     Medications Prior to Admission: Prior to Admission medications   Medication Sig Start Date End Date Taking? Authorizing Provider  acetaminophen (TYLENOL) 500 MG tablet Take 1,000-1,500 mg by mouth every 6 (six) hours as needed (for headaches.).    [provider]  aspirin EC 81 MG tablet Take 81 mg by mouth daily.    [provider]  atorvastatin (LIPITOR) 80 MG tablet TAKE 1 TABLET DAILY Patient taking differently: Take 80 mg by mouth daily.  07/30/18   Arnoldo Lenis, MD  furosemide (LASIX) 20 MG tablet TAKE 1 TABLET AS NEEDED Patient taking differently: Take 20 mg by mouth daily as needed for fluid or edema.  07/15/15   Herminio Commons, MD  HUMALOG KWIKPEN 100 UNIT/ML KiwkPen Inject 30 Units into the skin 3 (three) times daily with meals.  12/20/17   [provider]  HUMULIN N KWIKPEN 100 UNIT/ML Kiwkpen Inject 60 Units into the skin 2 (two) times daily.  12/20/17   [provider]  lisinopril (PRINIVIL,ZESTRIL) 20 MG tablet TAKE 1 TABLET DAILY Patient taking differently: Take 20 mg by mouth daily.  05/21/18   Arnoldo Lenis, MD  metFORMIN (GLUCOPHAGE-XR) 500 MG 24 hr tablet Take 500 mg by mouth 2 (two) times daily after a meal.  09/07/13   [provider]  Na Sulfate-K Sulfate-Mg Sulf (SUPREP BOWEL PREP KIT) 17.5-3.13-1.6 GM/177ML SOLN Take 1 kit by mouth as directed. 11/22/18   Mahala Menghini, PA-C  SYNTHROID 125 MCG tablet Take 125 mcg by mouth every  evening.  11/22/17   [provider]     Allergies:    Allergies  Allergen Reactions  . Invokana [Canagliflozin] Other (See Comments)    Skin peeling  . Janumet [Sitagliptin-Metformin Hcl] Other (See Comments)    Skin peeling  . Penicillins     Has patient had a PCN reaction causing immediate rash, facial/tongue/throat swelling, SOB or lightheadedness with hypotension: Yes-immediate rash Has patient had a PCN reaction causing severe rash involving mucus membranes or skin necrosis: Yes Has patient had a PCN reaction that required hospitalization: No Has patient had a PCN reaction occurring within the last 10 years: No If all of the above answers are "NO", then may proceed with Cephalosporin use.   . Prednisone Rash    Social History:   Social History   Socioeconomic History  . Marital status: Married    Spouse name: Not on file  . Number of children: 0  . Years of education: Not on file  . Highest education level: Not on file  Occupational History  . Occupation: Producer, 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[employer]    Comment: [employer], 13+ years  Social Needs  . Financial resource strain: Not on file  . Food insecurity:    Worry: Not on file    Inability: Not on file  . Transportation needs:    Medical: Not on file    Non-medical: Not on file  Tobacco Use  . Smoking status: Never Smoker  . Smokeless tobacco: Never Used  Substance and Sexual Activity  . Alcohol use: No    Alcohol/week: 0.0 standard drinks  . Drug use: No  . Sexual activity: Not on file  Lifestyle  . Physical activity:    Days per week: Not on file    Minutes per session: Not on file  . Stress: Not on file  Relationships  . Social connections:    Talks on phone: Not on file    Gets together: Not on file    Attends religious service: Not on file    Active member of club or organization: Not on file    Attends meetings of clubs or organizations: Not on file    Relationship status: Not on file  .  Intimate partner violence:    Fear of current or ex partner: Not on file    Emotionally abused: Not on file    Physically abused: Not on file    Forced sexual activity: Not on file  Other Topics Concern  . Not on file  Social History Narrative  . Not on file      Family History:  The patient's family history includes COPD (age of onset: 74) in her mother; Cancer (age of onset: 77) in her father; Coronary artery disease in her sister and another family member; Diabetes in an other family member; Heart attack (age of onset: 65) in her brother; Heart disease in her father; Hyperlipidemia in an other  family member; Hypertension in an other family member; Obesity in an other family member. There is no history of Colon cancer.     Review of Systems    General:  No chills, fever, night sweats or weight changes.  Cardiovascular:  No chest pain, edema, orthopnea, palpitations, paroxysmal nocturnal dyspnea. Positive for dyspnea on exertion.  Dermatological: No rash, lesions/masses Respiratory: No cough, dyspnea Urologic: No hematuria, dysuria Abdominal:   No nausea, vomiting, diarrhea, bright red blood per rectum, melena, or hematemesis Neurologic:  No visual changes, wkns, changes in mental status. All other systems reviewed and are otherwise negative except as noted above.  Physical Exam    Vitals:   12/12/18 1051 12/12/18 1100 12/12/18 1115 12/12/18 1130  BP:  (!) 111/49  (!) 120/35  Pulse:  (!) 37 (!) 35 (!) 38  Resp:  '13 15 15  ' Temp:      TempSrc:      SpO2:  100% 100% 95%  Weight: 107 kg     Height: '5\' 4"'  (1.626 m)      No intake or output data in the 24 hours ending 12/12/18 1208 Filed Weights   12/12/18 1051  Weight: 107 kg   Body mass index is 40.49 kg/m.   General: Well developed, obese Caucasian female in no acute distress. Head: Normocephalic, atraumatic, sclera non-icteric, no xanthomas, nares are without discharge. Dentition:  Neck: No carotid bruits. JVD not  elevated.  Lungs: Respirations regular and unlabored, without wheezes or rales.  Heart: Regular rhythm, bradycardiac rate. No S3 or S4.  No murmur, no rubs, or gallops appreciated. Abdomen: Soft, non-tender, non-distended with normoactive bowel sounds. No hepatomegaly. No rebound/guarding. No obvious abdominal masses. Msk:  Strength and tone appear normal for age. No joint deformities or effusions. Extremities: No clubbing or cyanosis. No edema.  Distal pedal pulses are 2+ bilaterally. Neuro: Alert and oriented X 3. Moves all extremities spontaneously. No focal deficits noted. Psych:  Responds to questions appropriately with a normal affect. Skin: No rashes or lesions noted  Labs and Radiology Studies    EKG:  The ECG that was done was personally reviewed and demonstrates 3rd degree AV Block, HR 37.  Relevant CV Studies:  NST: 09/2018  Moderate area of ischemia in the inferior/inferolateral walls (base, mid) Cannot exclude some soft tissue artifact due to radiotracer activity in gut  Nuclear stress EF: 60%.  This is a low risk study.   Laboratory Data:  ChemistryNo results for input(s): NA, K, CL, CO2, GLUCOSE, BUN, CREATININE, CALCIUM, GFRNONAA, GFRAA, ANIONGAP in the last 168 hours.  No results for input(s): PROT, ALBUMIN, AST, ALT, ALKPHOS, BILITOT in the last 168 hours. HematologyNo results for input(s): WBC, RBC, HGB, HCT, MCV, MCH, MCHC, RDW, PLT in the last 168 hours. Cardiac EnzymesNo results for input(s): TROPONINI in the last 168 hours. No results for input(s): TROPIPOC in the last 168 hours.  BNPNo results for input(s): BNP, PROBNP in the last 168 hours.  DDimer No results for input(s): DDIMER in the last 168 hours.  Radiology/Studies:  No results found.  Assessment and Plan:   1. Complete Heart Block - presented for outpatient screening colonoscopy today and was overall asymptomatic but found to be in CHB. Denies any associated chest pain, palpitations,  lightheadedness, dizziness, or presyncope. Has been experiencing dyspnea on exertion for the past several months which has been unchanged. Denies any acute dyspnea at this time. Not on any AV nodal blocking agents as an outpatient.  - labs  have been drawn including CBC, BMET, TSH, and Mg but results are pending at this time. Discussed with the patient and her family. Will plan to transfer to [facility] for evaluation by EP and likely PPM placement. EP APP and Card Master made aware. Echo ordered at the request of EP. Will try to obtain prior to transfer but if timing does not allow, can be performed at [facility].   2. HTN - BP recorded as 111/22 - 126/49 while in the ED. On Lisinopril as an outpatient. Will re-order to start tomorrow.   3. HLD - followed by PCP as an outpatient. Continue PTA Atorvastatin 73m daily.   4. IDDM - on Humulin, Humalog, and Metformin as an outpatient. She has been NPO since yesterday due to undergoing prep for her colonoscopy. Will hold outpatient regimen for now given continued NPO status. SSI ordered.    Severity of Illness: The appropriate patient status for this patient is INPATIENT. Inpatient status is judged to be reasonable and necessary in order to provide the required intensity of service to ensure the patient's safety. The patient's presenting symptoms, physical exam findings, and initial radiographic and laboratory data in the context of their chronic comorbidities is felt to place them at high risk for further clinical deterioration. Furthermore, it is not anticipated that the patient will be medically stable for discharge from the hospital within 2 midnights of admission. The following factors support the patient status of inpatient.   " The patient's presenting symptoms include dyspnea. " The worrisome physical exam findings include bradycardia. " The initial radiographic and laboratory data are worrisome because of CHB. " The chronic co-morbidities include HTN,  HLD, and IDDM.  * I certify that at the point of admission it is my clinical judgment that the patient will require inpatient hospital care spanning beyond 2 midnights from the point of admission due to high intensity of service, high risk for further deterioration and high frequency of surveillance required.*    For questions or updates, please contact [place]portPlease consult [email or web address] for contact info under Cardiology/STEMI.   Signed, BErma Heritage PA-C 12/12/2018, 12:08 PM Pager: [phone]  Attending note  Patient seen and discussed with PA SAhmed Prima I agree with her documentation. 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   She presented today for outpatient colonscopy. Found to be bradycardic to 30s, EKG showed complete heart blocker. Normal bp's, no significant symptoms. Has overall felt well except for some recent DOE. She is not on any av nodal agents. Labs are pending, echo pending. [facility]. She has a steady junctional escape in the 30s and normal bp and no symptoms, no indication for dobutamine/isoproternol drip or temp pacing at this time.   JCarlyle DollyMD

## 2018-12-12 NOTE — Consult Note (Addendum)
Cardiology Consultation:   Patient ID: Jordan Perry MRN: [id number]; DOB: [birth year]  Admit date: 12/12/2018 Date of Consult: 12/13/2018  Primary Care Provider: Sharilyn Sites, MD Primary Cardiologist: Carlyle Dolly, MD  Primary Electrophysiologist:  None    Patient Profile:   Jordan Perry is a 63 y.o. female with a hx of HTN, HLD, IDDM who is being seen today for the evaluation of CHB at the request of Dr. Harl Bowie.  History of Present Illness:   Jordan Perry was evaluated last by cardiology service for c/o increased DOE/SOB particularly with hills/stairs given strong family hx of CAD, planned for stress testing which was done, noting normal perfusion, with LVEF 60%, was a low risk study.  Today she arrived for a screening colonoscopy, she was found bradycardic, an EKG done noting CHB and referred to the ER, her colonoscopy cancelled.  She was evaluated buy cardiology, finding no reversible causes was planned for transfer to [facility] for EP to see/PPM implant.  The patient works as a Financial controller here, has been feeling well outside of intermittent SOB with exertional, particularly inclines/stairs.  No reports of dizzy spells or syncope, no unusual weakness, no CP.  She remains asymptomatic at this time, ambulating in the room without difficulty as I enter, with HR 30's-40's in CHB.  LABS K+ 4.4 BUN/Creat 16/0/72 Mag 1.9 Trop I: <0.03 WBC 11.0 H/H 10/33 Plts 238 TSH 2.249   Home meds reviewed, no nodal blocking/rate limiting agents noted   Past Medical History:  Diagnosis Date  . Allergic rhinitis, cause unspecified   . Arthritis   . Coronary atherosclerosis of unspecified type of vessel, native or graft   . GERD (gastroesophageal reflux disease)   . Hypothyroidism   . Proteinuria   . Pure hyperglyceridemia   . Type II or unspecified type diabetes mellitus without mention of complication, not stated as uncontrolled   . Unspecified essential hypertension     Past  Surgical History:  Procedure Laterality Date  . CYSTECTOMY  1989   removed from end of spine  . REFRACTIVE SURGERY  1994     Home Medications:  Prior to Admission medications   Medication Sig Start Date End Date Taking? Authorizing Provider  acetaminophen (TYLENOL) 500 MG tablet Take 1,000-1,500 mg by mouth every 6 (six) hours as needed (for headaches.).   Yes [provider]  aspirin EC 81 MG tablet Take 81 mg by mouth daily.   Yes [provider]  atorvastatin (LIPITOR) 80 MG tablet TAKE 1 TABLET DAILY Patient taking differently: Take 80 mg by mouth daily.  07/30/18  Yes Branch, Alphonse Guild, MD  furosemide (LASIX) 20 MG tablet TAKE 1 TABLET AS NEEDED Patient taking differently: Take 20 mg by mouth daily as needed for fluid or edema.  07/15/15  Yes Herminio Commons, MD  HUMALOG KWIKPEN 100 UNIT/ML KiwkPen Inject 30 Units into the skin 3 (three) times daily with meals.  12/20/17  Yes [provider]  HUMULIN N KWIKPEN 100 UNIT/ML Kiwkpen Inject 60 Units into the skin 2 (two) times daily.  12/20/17  Yes [provider]  lisinopril (PRINIVIL,ZESTRIL) 20 MG tablet TAKE 1 TABLET DAILY Patient taking differently: Take 20 mg by mouth daily.  05/21/18  Yes Arnoldo Lenis, MD  metFORMIN (GLUCOPHAGE-XR) 500 MG 24 hr tablet Take 500 mg by mouth 2 (two) times daily after a meal.  09/07/13  Yes [provider]  SYNTHROID 125 MCG tablet Take 125 mcg by mouth every evening.  11/22/17  Yes [provider]  Na Sulfate-K Sulfate-Mg Sulf (SUPREP BOWEL PREP KIT) 17.5-3.13-1.6 GM/177ML SOLN Take 1 kit by mouth as directed. Patient not taking: Reported on 12/12/2018 11/22/18   Mahala Menghini, PA-C    Inpatient Medications: Scheduled Meds: . aspirin EC  81 mg Oral Daily  . atorvastatin  80 mg Oral q1800  . enoxaparin (LOVENOX) injection  40 mg Subcutaneous Q24H  . insulin aspart  0-15 Units Subcutaneous TID WC  . levothyroxine  125 mcg Oral QPM  .  lisinopril  20 mg Oral Daily   Continuous Infusions:  PRN Meds:   Allergies:    Allergies  Allergen Reactions  . Invokana [Canagliflozin] Other (See Comments)    Skin peeling  . Janumet [Sitagliptin-Metformin Hcl] Other (See Comments)    Skin peeling  . Penicillins     Has patient had a PCN reaction causing immediate rash, facial/tongue/throat swelling, SOB or lightheadedness with hypotension: Yes-immediate rash Has patient had a PCN reaction causing severe rash involving mucus membranes or skin necrosis: Yes Has patient had a PCN reaction that required hospitalization: No Has patient had a PCN reaction occurring within the last 10 years: No If all of the above answers are "NO", then may proceed with Cephalosporin use.   . Prednisone Rash    Social History:   Social History   Socioeconomic History  . Marital status: Married    Spouse name: Not on file  . Number of children: 0  . Years of education: Not on file  . Highest education level: Not on file  Occupational History  . Occupation: Producer, 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[employer]    Comment: [employer], 13+ years  Social Needs  . Financial resource strain: Not on file  . Food insecurity:    Worry: Not on file    Inability: Not on file  . Transportation needs:    Medical: Not on file    Non-medical: Not on file  Tobacco Use  . Smoking status: Never Smoker  . Smokeless tobacco: Never Used  Substance and Sexual Activity  . Alcohol use: No    Alcohol/week: 0.0 standard drinks  . Drug use: No  . Sexual activity: Not on file  Lifestyle  . Physical activity:    Days per week: Not on file    Minutes per session: Not on file  . Stress: Not on file  Relationships  . Social connections:    Talks on phone: Not on file    Gets together: Not on file    Attends religious service: Not on file    Active member of club or organization: Not on file    Attends meetings of clubs or organizations: Not on file    Relationship status:  Not on file  . Intimate partner violence:    Fear of current or ex partner: Not on file    Emotionally abused: Not on file    Physically abused: Not on file    Forced sexual activity: Not on file  Other Topics Concern  . Not on file  Social History Narrative  . Not on file    Family History:   Family History  Problem Relation Age of Onset  . COPD Mother 27  . Heart disease Father   . Cancer Father 6  . Coronary artery disease Other   . Diabetes Other   . Obesity Other   . Heart attack Brother 27  . Coronary artery disease Sister   .  Hyperlipidemia Other   . Hypertension Other   . Colon cancer Neg Hx      ROS:  Please see the history of present illness.  All other ROS reviewed and negative.     Physical Exam/Data:   Vitals:   12/12/18 2130 12/12/18 2240 12/12/18 2300 12/13/18 0435  BP: (!) 104/47 (!) 155/46 (!) 127/50 (!) 137/50  Pulse: (!) 35  (!) 34 (!) 38  Resp: 19 (!) '22 12 19  ' Temp:  98.7 F (37.1 C) 97.8 F (36.6 C) 97.8 F (36.6 C)  TempSrc:  Oral Oral Oral  SpO2: 91% 100% 100% 96%  Weight:   110.2 kg   Height:   '5\' 4"'  (1.626 m)    No intake or output data in the 24 hours ending 12/13/18 4158 Last 3 Weights 12/12/2018 12/12/2018 12/12/2018  Weight (lbs) 242 lb 15.2 oz 235 lb 14.3 oz 238 lb  Weight (kg) 110.2 kg 107 kg 107.956 kg     Body mass index is 41.7 kg/m.  General:  Well nourished, well developed, in no acute distress HEENT: normal Lymph: no adenopathy Neck: no JVD Endocrine:  No thryomegaly Vascular: No carotid bruits Cardiac:  RRR; bradycardic no murmurs, gallops or rubs Lungs: CTA b/l, no wheezing, rhonchi or rales  Abd: soft, nontender, obese  Ext: no edema Musculoskeletal:  No deformities Skin: warm and dry  Neuro: no focal abnormalities noted Psych:  Normal affect   EKG:  The EKG was personally reviewed and demonstrates:   #1 CHB 36bpm, QRS 6m #2 CHB 37bpm, QRS 878m 10/09/18: SR 69bpm  Telemetry:  Telemetry was personally  reviewed and demonstrates:   CHB 30's-40's  Relevant CV Studies:  12/12/2018: TTE IMPRESSIONS  1. The left ventricle has normal systolic function with an ejection fraction of 60-65%. The cavity size was normal. Left ventricular diastolic Doppler parameters are indeterminate.  2. The mitral valve is normal in structure. Mild thickening of the mitral valve leaflet. No evidence of mitral valve stenosis.  3. The tricuspid valve is normal in structure.  4. The aortic valve is tricuspid Mild thickening of the aortic valve no stenosis of the aortic valve.  5. The aortic root is normal in size and structure.  6. Pulmonary hypertension is mildly elevated, PASP is 38 mmHg.  7. The inferior vena cava was dilated in size with >50% respiratory variability.  8. The interatrial septum was not well visualized.  9. RV poorly visualized. Grossly appears enlarged with normal function  FINDINGS  Left Ventricle: The left ventricle has normal systolic function, with an ejection fraction of 60-65%. The cavity size was normal. There is no increase in left ventricular wall thickness. Left ventricular diastolic Doppler parameters are indeterminate  Definity contrast agent was given IV to delineate the left ventricular endocardial borders. Right Ventricle: The right ventricle has normal systolic function. The cavity was RV poorly visualized. Grossly appears enlarged. There is right vetricular wall thickness was not assessed.  10/23/18; lexiscaan stress test  Nuclear stress EF: 60%.  Normal perfusion Note TID noted (1.36) In setting of normal perfusion uncertain significance  Low risk study    Laboratory Data:  Chemistry Recent Labs  Lab 12/12/18 1208  NA 137  K 4.4  CL 109  CO2 18*  GLUCOSE 165*  BUN 16  CREATININE 0.72  CALCIUM 8.8*  GFRNONAA >60  GFRAA >60  ANIONGAP 10    Recent Labs  Lab 12/12/18 1208  PROT 7.1  ALBUMIN 3.6  AST 15  ALT 25  ALKPHOS 83  BILITOT 0.6    Hematology Recent Labs  Lab 12/12/18 1208  WBC 11.0*  RBC 3.55*  HGB 10.2*  HCT 33.5*  MCV 94.4  MCH 28.7  MCHC 30.4  RDW 14.6  PLT 238   Cardiac Enzymes Recent Labs  Lab 12/12/18 1208  TROPONINI <0.03   No results for input(s): TROPIPOC in the last 168 hours.  BNPNo results for input(s): BNP, PROBNP in the last 168 hours.  DDimer No results for input(s): DDIMER in the last 168 hours.  Radiology/Studies:  No results found.  Assessment and Plan:   1. CHB     Largely asymptomatic, with some months of intermittent DOE with inclines/stairs only     BP is OK     Not on any nodal blocking/rate limiting agents     Labs look OK     LVEF OK, no ischemia on her stress test in Dec  PPM is planned for this afternoon I discussed the rational, implant procedure, possible risks and benefits, she is agreeable to proceed.  Dr. Lovena Le to see  2. HTN     No changes today  3. IDDM     Continue SSI          For questions or updates, please contact [place] Please consult [email or web address] for contact info under     Signed, Baldwin Jamaica, PA-C  12/13/2018 7:12 AM;  EP Attending  Patient seen and examined. Agree with the findings as noted abobve. The patient has developed symptomatic CHB. I have reviewed the indications/risks/benefits/goals/expectations of PPM insertion and she wishes to proceed.   Mikle Bosworth.D.

## 2018-12-12 NOTE — H&P (Addendum)
'@LOGO' @   Primary Care Physician:  Jordan Sites, MD Primary Gastroenterologist:  Dr. Gala Romney  Pre-Procedure History & Physical: HPI:  Jordan Perry is a 63 y.o. female is here for a screening colonoscopy.  She was hooked up on her monitor at front and noted to be profoundly bradycardic.  Rhythm strip and twelve-lead EKG appears to show third-degree AV block.  (P waves march through without any association to QRS complexes).  Patient's only symptoms dyspnea on exertion recently.  Had normal sinus rhythm and a stress test back in December.  She denies palpitations, dizziness or syncope.  Past Medical History:  Diagnosis Date  . Allergic rhinitis, cause unspecified   . Arthritis   . Coronary atherosclerosis of unspecified type of vessel, native or graft   . GERD (gastroesophageal reflux disease)   . Hypothyroidism   . Proteinuria   . Pure hyperglyceridemia   . Type II or unspecified type diabetes mellitus without mention of complication, not stated as uncontrolled   . Unspecified essential hypertension     Past Surgical History:  Procedure Laterality Date  . CYSTECTOMY  1989   removed from end of spine  . [place]    Prior to Admission medications   Medication Sig Start Date End Date Taking? Authorizing Provider  acetaminophen (TYLENOL) 500 MG tablet Take 1,000-1,500 mg by mouth every 6 (six) hours as needed (for headaches.).   Yes [provider]  aspirin EC 81 MG tablet Take 81 mg by mouth daily.   Yes [provider]  atorvastatin (LIPITOR) 80 MG tablet TAKE 1 TABLET DAILY Patient taking differently: Take 80 mg by mouth daily.  07/30/18  Yes Branch, Alphonse Guild, MD  furosemide (LASIX) 20 MG tablet TAKE 1 TABLET AS NEEDED Patient taking differently: Take 20 mg by mouth daily as needed for fluid or edema.  07/15/15  Yes Herminio Commons, MD  HUMALOG KWIKPEN 100 UNIT/ML KiwkPen Inject 30 Units into the skin 3 (three) times daily with meals.   12/20/17  Yes [provider]  HUMULIN N KWIKPEN 100 UNIT/ML Kiwkpen Inject 60 Units into the skin 2 (two) times daily.  12/20/17  Yes [provider]  lisinopril (PRINIVIL,ZESTRIL) 20 MG tablet TAKE 1 TABLET DAILY Patient taking differently: Take 20 mg by mouth daily.  05/21/18  Yes BranchAlphonse Guild, MD  metFORMIN (GLUCOPHAGE-XR) 500 MG 24 hr tablet Take 500 mg by mouth 2 (two) times daily after a meal.  09/07/13  Yes [provider]  Na Sulfate-K Sulfate-Mg Sulf (SUPREP BOWEL PREP KIT) 17.5-3.13-1.6 GM/177ML SOLN Take 1 kit by mouth as directed. 11/22/18  Yes Mahala Menghini, PA-C  SYNTHROID 125 MCG tablet Take 125 mcg by mouth every evening.  11/22/17  Yes [provider]    Allergies as of 11/22/2018 - Review Complete 11/22/2018  Allergen Reaction Noted  . Invokana [canagliflozin] Other (See Comments) 08/28/2015  . Janumet [sitagliptin-metformin hcl] Other (See Comments) 08/28/2015  . Penicillins    . Prednisone      Family History  Problem Relation Age of Onset  . COPD Mother 58  . Heart disease Father   . Cancer Father 39  . Coronary artery disease Other   . Diabetes Other   . Obesity Other   . Heart attack Brother 77  . Coronary artery disease Sister   . Hyperlipidemia Other   . Hypertension Other   . Colon cancer Neg Hx     Social History   Socioeconomic  History  . Marital status: Married    Spouse name: Not on file  . Number of children: 0  . Years of education: Not on file  . Highest education level: Not on file  Occupational History  . 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 Financial resource strain: Not on file  . Food insecurity:    Worry: Not on file    Inability: Not on file  . Transportation needs:    Medical: Not on file    Non-medical: Not on file  Tobacco Use  . Smoking status: Never Smoker  . Smokeless tobacco: Never Used  Substance and Sexual Activity   . Alcohol use: No    Alcohol/week: 0.0 standard drinks  . Drug use: No  . Sexual activity: Not on file  Lifestyle  . Physical activity:    Days per week: Not on file    Minutes per session: Not on file  . Stress: Not on file  Relationships  . Social connections:    Talks on phone: Not on file    Gets together: Not on file    Attends religious service: Not on file    Active member of club or organization: Not on file    Attends meetings of clubs or organizations: Not on file    Relationship status: Not on file  . Intimate partner violence:    Fear of current or ex partner: Not on file    Emotionally abused: Not on file    Physically abused: Not on file    Forced sexual activity: Not on file  Other Topics Concern  . Not on file  Social History Narrative  . Not on file    Review of Systems: See HPI, otherwise negative ROS  Physical Exam: Radial pulse mid 30s by me There were no vitals taken for this visit. General:   Alert,  Well-developed, well-nourished, pleasant and cooperative in NAD Head:  Normocephalic and atraumatic. Lungs:  Clear throughout to auscultation.   No wheezes, crackles, or rhonchi. No acute distress. Heart: Bradycardic Abdomen:  Soft, nontender and nondistended. No masses, hepatosplenomegaly or hernias noted. Normal bowel sounds, without guarding, and without rebound.    Impression/Plan: Jordan Perry is now here to undergo a screening colonoscopy.  Patient appears to have third-degree AV block on EKG/monitor.  Aside from [place] no symptoms.  She has taken a colonoscopy prep.  I told her and her husband the potential for increased vagal tone during a colonoscopy and this could be hazardous.  It is in patient's best interest to not undergo a colonoscopy today.  I have requested a call back from the cardiology PA, Bernerd Pho to get assistance with her acute management.  Spoke to Dr. Harl Bowie.  He recommended transferring patient to the ED.  He states he  will see the patient in the ED. Spoke to Dr. Lacinda Axon in the ED.  Patient being transferred down there.  Lengthy discussion with patient and husband regarding new finding and need to change plans today.  We will plan for screening colonoscopy after bradycardia has been addressed.    Notice:  This dictation was prepared with Dragon dictation along with smaller phrase technology. Any transcriptional errors that result from this process are unintentional and may not be corrected upon review.

## 2018-12-12 NOTE — ED Notes (Signed)
Pt reports only having SOB exertion

## 2018-12-12 NOTE — OR Nursing (Signed)
Patient transported to the ED via stretcher. Report given to Angelina Pih RN.

## 2018-12-13 ENCOUNTER — Inpatient Hospital Stay (HOSPITAL_COMMUNITY)
Admission: EM | Disposition: A | Discharge status: Home / Self Care | Payer: Self-pay | Source: Home / Self Care | Attending: Internal Medicine

## 2018-12-13 DIAGNOSIS — I442 Atrioventricular block, complete: Principal | ICD-10-CM

## 2018-12-13 HISTORY — PX: PACEMAKER IMPLANT: EP1218

## 2018-12-13 LAB — GLUCOSE, CAPILLARY
Glucose-Capillary: 172 mg/dL — ABNORMAL HIGH (ref 70–99)
Glucose-Capillary: 155 mg/dL — ABNORMAL HIGH (ref 70–99)
Glucose-Capillary: 101 mg/dL — ABNORMAL HIGH (ref 70–99)
Glucose-Capillary: 173 mg/dL — ABNORMAL HIGH (ref 70–99)

## 2018-12-13 LAB — MRSA PCR SCREENING: MRSA by PCR: NEGATIVE

## 2018-12-13 SURGERY — PACEMAKER IMPLANT

## 2018-12-13 MED ORDER — SODIUM CHLORIDE 0.9% FLUSH: 3.0000 mL | INTRAVENOUS | Status: DC | PRN

## 2018-12-13 MED ORDER — MIDAZOLAM HCL 5 MG/5ML IJ SOLN
INTRAMUSCULAR | Status: DC | PRN
  Administered 2018-12-13: 1 mg via INTRAVENOUS

## 2018-12-13 MED ORDER — VANCOMYCIN HCL IN DEXTROSE 1-5 GM/200ML-% IV SOLN: 1000.0000 mg | Freq: Two times a day (BID) | INTRAVENOUS | Status: DC

## 2018-12-13 MED ORDER — ACETAMINOPHEN 325 MG PO TABS: 325.0000 mg | ORAL_TABLET | ORAL | Status: DC | PRN

## 2018-12-13 MED ORDER — FENTANYL CITRATE (PF) 100 MCG/2ML IJ SOLN
INTRAMUSCULAR | Status: AC
  Filled 2018-12-13: qty 2

## 2018-12-13 MED ORDER — ONDANSETRON HCL 4 MG/2ML IJ SOLN: 4.0000 mg | Freq: Four times a day (QID) | INTRAMUSCULAR | Status: DC | PRN

## 2018-12-13 MED ORDER — LIDOCAINE HCL (PF) 1 % IJ SOLN
INTRAMUSCULAR | Status: AC
  Filled 2018-12-13: qty 60

## 2018-12-13 MED ORDER — VANCOMYCIN HCL IN DEXTROSE 1-5 GM/200ML-% IV SOLN
INTRAVENOUS | Status: AC
  Filled 2018-12-13: qty 200

## 2018-12-13 MED ORDER — SODIUM CHLORIDE 0.9 % IV SOLN: 250.0000 mL | INTRAVENOUS | Status: DC

## 2018-12-13 MED ORDER — FENTANYL CITRATE (PF) 100 MCG/2ML IJ SOLN
INTRAMUSCULAR | Status: DC | PRN
  Administered 2018-12-13: 12.5 ug via INTRAVENOUS

## 2018-12-13 MED ORDER — HEPARIN (PORCINE) IN NACL 1000-0.9 UT/500ML-% IV SOLN
INTRAVENOUS | Status: AC
  Filled 2018-12-13: qty 500

## 2018-12-13 MED ORDER — SODIUM CHLORIDE 0.9 % IV SOLN
INTRAVENOUS | Status: AC
  Filled 2018-12-13: qty 2

## 2018-12-13 MED ORDER — LIDOCAINE HCL (PF) 1 % IJ SOLN
INTRAMUSCULAR | Status: DC | PRN
  Administered 2018-12-13: 45 mL

## 2018-12-13 MED ORDER — SODIUM CHLORIDE 0.9 % IV SOLN
INTRAVENOUS | Status: DC
  Administered 2018-12-13: 14:00:00 via INTRAVENOUS

## 2018-12-13 MED ORDER — CHLORHEXIDINE GLUCONATE 4 % EX LIQD
60.0000 mL | Freq: Once | CUTANEOUS | Status: AC
  Filled 2018-12-13: qty 60

## 2018-12-13 MED ORDER — SODIUM CHLORIDE 0.9% FLUSH
3.0000 mL | Freq: Two times a day (BID) | INTRAVENOUS | Status: DC
  Administered 2018-12-13: 3 mL via INTRAVENOUS

## 2018-12-13 MED ORDER — VANCOMYCIN HCL IN DEXTROSE 1-5 GM/200ML-% IV SOLN
1000.0000 mg | Freq: Two times a day (BID) | INTRAVENOUS | Status: AC
  Administered 2018-12-14: 1000 mg via INTRAVENOUS
  Filled 2018-12-13: qty 200

## 2018-12-13 MED ORDER — IOPAMIDOL (ISOVUE-370) INJECTION 76%
INTRAVENOUS | Status: AC
  Filled 2018-12-13: qty 50

## 2018-12-13 MED ORDER — LIDOCAINE HCL (PF) 1 % IJ SOLN
INTRAMUSCULAR | Status: AC
  Filled 2018-12-13: qty 30

## 2018-12-13 MED ORDER — VANCOMYCIN HCL 10 G IV SOLR
1500.0000 mg | INTRAVENOUS | Status: AC
  Administered 2018-12-13: 1500 mg via INTRAVENOUS
  Filled 2018-12-13 (×2): qty 1500

## 2018-12-13 MED ORDER — HEPARIN (PORCINE) IN NACL 1000-0.9 UT/500ML-% IV SOLN
INTRAVENOUS | Status: DC | PRN
  Administered 2018-12-13: 500 mL

## 2018-12-13 MED ORDER — CHLORHEXIDINE GLUCONATE 4 % EX LIQD
60.0000 mL | Freq: Once | CUTANEOUS | Status: AC
  Administered 2018-12-13: 4 via TOPICAL

## 2018-12-13 MED ORDER — SODIUM CHLORIDE 0.9 % IV SOLN
80.0000 mg | INTRAVENOUS | Status: AC
  Administered 2018-12-13: 80 mg

## 2018-12-13 MED ORDER — MIDAZOLAM HCL 5 MG/5ML IJ SOLN
INTRAMUSCULAR | Status: AC
  Filled 2018-12-13: qty 5

## 2018-12-13 MED ORDER — ACETAMINOPHEN 325 MG PO TABS
325.0000 mg | ORAL_TABLET | ORAL | Status: DC | PRN
  Administered 2018-12-14 (×2): 325 mg via ORAL
  Administered 2018-12-14: 650 mg via ORAL
  Filled 2018-12-13 (×3): qty 2

## 2018-12-13 SURGICAL SUPPLY — 8 items
CABLE SURGICAL S-101-97-12 (CABLE) ×3 IMPLANT
HOVERMATT SINGLE USE (MISCELLANEOUS) ×2 IMPLANT
LEAD TENDRIL MRI 46CM LPA1200M (Lead) ×2 IMPLANT
LEAD TENDRIL MRI 52CM LPA1200M (Lead) ×2 IMPLANT
PACEMAKER ASSURITY DR-RF (Pacemaker) ×2 IMPLANT
PAD PRO RADIOLUCENT 2001M-C (PAD) ×3 IMPLANT
SHEATH CLASSIC 8F (SHEATH) ×4 IMPLANT
TRAY PACEMAKER INSERTION (PACKS) ×3 IMPLANT

## 2018-12-13 NOTE — Discharge Instructions (Signed)
° ° °  Supplemental Discharge Instructions for  °Pacemaker/Defibrillator Patients ° °Activity °No heavy lifting or vigorous activity with your left/right arm for 6 to 8 weeks.  Do not raise your left/right arm above your head for one week.  Gradually raise your affected arm as drawn below. ° °        °   12/17/2018                 12/18/2018                12/19/2018              12/20/2018 °__ ° °NO DRIVING for 1 week   ; you may begin driving on  12/20/2018   . ° °WOUND CARE °- Keep the wound area clean and dry.  Do not get this area wet for one week. No showers for one week; you may shower on  12/20/2018   . °- The tape/steri-strips on your wound will fall off; do not pull them off.  No bandage is needed on the site.  DO  NOT apply any creams, oils, or ointments to the wound area. °- If you notice any drainage or discharge from the wound, any swelling or bruising at the site, or you develop a fever > 101? F after you are discharged home, call the office at once. ° °Special Instructions °- You are still able to use cellular telephones; use the ear opposite the side where you have your pacemaker/defibrillator.  Avoid carrying your cellular phone near your device. °- When traveling through airports, show security personnel your identification card to avoid being screened in the metal detectors.  Ask the security personnel to use the hand wand. °- Avoid arc welding equipment, MRI testing (magnetic resonance imaging), TENS units (transcutaneous nerve stimulators).  Call the office for questions about other devices. °- Avoid electrical appliances that are in poor condition or are not properly grounded. °- Microwave ovens are safe to be near or to operate. ° ° °

## 2018-12-13 NOTE — Progress Notes (Signed)
Pt returned from Cath  lab.

## 2018-12-13 NOTE — Progress Notes (Signed)
Pt left unit for cath lab accompanied by family and drs.

## 2018-12-13 NOTE — Discharge Summary (Addendum)
ELECTROPHYSIOLOGY PROCEDURE DISCHARGE SUMMARY    Patient ID: [name],  MRN: [id number], DOB/AGE: 26-Feb-1956 63 y.o.  Admit date: 12/12/2018 Discharge date: 12/14/2018  Primary Care Physician: Sharilyn Sites, MD  Primary Cardiologist: Dr. Harl Bowie Electrophysiologist: new, dr. Lovena Le  Primary Discharge Diagnosis:  1. CHB  Secondary Discharge Diagnosis:  1. HTN 2. HLD 3. IDDM 4. Obesity  Allergies  Allergen Reactions  . Invokana [Canagliflozin] Other (See Comments)    Skin peeling  . Janumet [Sitagliptin-Metformin Hcl] Other (See Comments)    Skin peeling  . Penicillins     Has patient had a PCN reaction causing immediate rash, facial/tongue/throat swelling, SOB or lightheadedness with hypotension: Yes-immediate rash Has patient had a PCN reaction causing severe rash involving mucus membranes or skin necrosis: Yes Has patient had a PCN reaction that required hospitalization: No Has patient had a PCN reaction occurring within the last 10 years: No If all of the above answers are "NO", then may proceed with Cephalosporin use.   . Prednisone Rash     Procedures This Admission:  1.  Implantation of a SJM dual chamber PPM on 12/13/2018 by Dr Lovena Le.  The patient received a Environmental health practitioner (serial number [id number]) right atrial lead and a [employer] (serial number [id number]) right ventricular lead Saint Jude (serial number [id number]) pacemaker There were no immediate post procedure complications. 2.  CXR on 12/14/2018 demonstrated no pneumothorax status post device implantation.   Brief HPI: Jordan Perry is a 63 y.o. female was referred to the ER at [facility] when she arricved for her elective/screening colonoscopy noted bradycardic and found in CHB.   Hospital Course:  The patient's PMHx is noted above.  She had been c/o unusual exertional SOB for a few months, ischemic w/u was negative with a myoview.  She does report in hindsight generally more tired then usual of late.   She was admitted no reversible causes for her bradycardia were found, TTE noted LVEF 60-65% transferred to [facility] for PPM implant.  She underwent implantation of a PPM with details as outlined above. She was monitored on telemetry overnight which demonstrated SR/V paced.  Left chest was without hematoma or ecchymosis.  The device was interrogated and found to be functioning normally.  CXR was obtained and demonstrated no pneumothorax status post device implantation.  Wound care, arm mobility, and restrictions were reviewed with the patient.  The patient feels well, no CP or SOB, she was examined and considered stable for discharge to home.    Physical Exam: Vitals:   12/14/18 0100 12/14/18 0322 12/14/18 0815 12/14/18 1132  BP: (!) 114/55 97/79 (!) 149/77 (!) 152/76  Pulse: 92 79 81 73  Resp: 19 (!) 23  (!) 22  Temp:  98.7 F (37.1 C) 98.5 F (36.9 C) 98.8 F (37.1 C)  TempSrc:  Oral Oral Oral  SpO2: 98% 98%  100%  Weight:      Height:         GEN- The patient is well appearing, alert and oriented x 3 today.   HEENT: normocephalic, atraumatic; sclera clear, conjunctiva pink; hearing intact; oropharynx clear; neck supple, no JVP Lungs- CTA b/l, normal work of breathing.  No wheezes, rales, rhonchi Heart- RRR, no murmurs, rubs or gallops, PMI not laterally displaced GI- soft, non-tender, non-distended Extremities- no clubbing, cyanosis, or edema MS- no significant deformity or atrophy Skin- warm and dry, no rash or lesion, left chest without hematoma/ecchymosis Psych- euthymic mood, full affect Neuro- no  gross deficits   Labs:   Lab Results  Component Value Date   WBC 11.0 (H) 12/12/2018   HGB 10.2 (L) 12/12/2018   HCT 33.5 (L) 12/12/2018   MCV 94.4 12/12/2018   PLT 238 12/12/2018    Recent Labs  Lab 12/12/18 1208  NA 137  K 4.4  CL 109  CO2 18*  BUN 16  CREATININE 0.72  CALCIUM 8.8*  PROT 7.1  BILITOT 0.6  ALKPHOS 83  ALT 25  AST 15  GLUCOSE 165*    Discharge  Medications:  Allergies as of 12/14/2018      Reactions   Invokana [canagliflozin] Other (See Comments)   Skin peeling   Janumet [sitagliptin-metformin Hcl] Other (See Comments)   Skin peeling   Penicillins    Has patient had a PCN reaction causing immediate rash, facial/tongue/throat swelling, SOB or lightheadedness with hypotension: Yes-immediate rash Has patient had a PCN reaction causing severe rash involving mucus membranes or skin necrosis: Yes Has patient had a PCN reaction that required hospitalization: No Has patient had a PCN reaction occurring within the last 10 years: No If all of the above answers are "NO", then may proceed with Cephalosporin use.   Prednisone Rash      Medication List    TAKE these medications   acetaminophen 500 MG tablet Commonly known as:  TYLENOL Take 1,000-1,500 mg by mouth every 6 (six) hours as needed (for headaches.).   aspirin EC 81 MG tablet Take 81 mg by mouth daily.   atorvastatin 80 MG tablet Commonly known as:  LIPITOR TAKE 1 TABLET DAILY   furosemide 20 MG tablet Commonly known as:  LASIX TAKE 1 TABLET AS NEEDED What changed:    when to take this  reasons to take this   HUMALOG KWIKPEN 100 UNIT/ML KwikPen Generic drug:  insulin lispro Inject 30 Units into the skin 3 (three) times daily with meals.   HUMULIN N KWIKPEN 100 UNIT/ML Kiwkpen Generic drug:  Insulin NPH (Human) (Isophane) Inject 60 Units into the skin 2 (two) times daily.   lisinopril 20 MG tablet Commonly known as:  PRINIVIL,ZESTRIL TAKE 1 TABLET DAILY   metFORMIN 500 MG 24 hr tablet Commonly known as:  GLUCOPHAGE-XR Take 500 mg by mouth 2 (two) times daily after a meal.   Na Sulfate-K Sulfate-Mg Sulf 17.5-3.13-1.6 GM/177ML Soln Commonly known as:  SUPREP BOWEL PREP KIT Take 1 kit by mouth as directed.   SYNTHROID 125 MCG tablet Generic drug:  levothyroxine Take 125 mcg by mouth every evening.       Disposition: Home  Discharge Instructions     Diet - low sodium heart healthy   Complete by:  As directed    Increase activity slowly   Complete by:  As directed      Follow-up Information    [facility] Follow up.   Specialty:  Cardiology Why:  12/24/2018 @ 8:30AM, wound check visit Contact information: [address] [place]       Evans Lance, MD Follow up.   Specialty:  Cardiology Why:  03/15/2019 @ 9:45AM Contact information: [place] [address] [phone]           Duration of Discharge Encounter: Greater than 30 minutes including physician time.  Venetia Night, PA-C 12/14/2018 12:23 PM  EP Attending  Patient seen and examined. Agree with the findings as noted above. The patient is doing well  today, s/p PPM insertion due to CHB. PPM interrogation under my direct supervision demonstrates normal DDD PM function. Her CXR looks good. Incision is without hematoma. She will be discharged home with usual followup.  Mikle Bosworth.D.

## 2018-12-14 ENCOUNTER — Inpatient Hospital Stay (HOSPITAL_COMMUNITY): Payer: BLUE CROSS/BLUE SHIELD

## 2018-12-14 ENCOUNTER — Encounter (HOSPITAL_COMMUNITY): Payer: Self-pay | Admitting: Internal Medicine

## 2018-12-14 LAB — GLUCOSE, CAPILLARY
Glucose-Capillary: 240 mg/dL — ABNORMAL HIGH (ref 70–99)
Glucose-Capillary: 222 mg/dL — ABNORMAL HIGH (ref 70–99)

## 2018-12-14 LAB — HIV ANTIBODY (ROUTINE TESTING W REFLEX): HIV Screen 4th Generation wRfx: NONREACTIVE

## 2018-12-14 MED ORDER — GUAIFENESIN-DM 100-10 MG/5ML PO SYRP
5.0000 mL | ORAL_SOLUTION | ORAL | Status: DC | PRN
  Administered 2018-12-14 (×2): 5 mL via ORAL
  Filled 2018-12-14 (×2): qty 5

## 2018-12-14 MED ORDER — YOU HAVE A PACEMAKER BOOK
Freq: Once | Status: AC
  Administered 2018-12-14: 05:00:00
  Filled 2018-12-14: qty 1

## 2018-12-14 MED FILL — Lidocaine HCl Local Preservative Free (PF) Inj 1%: INTRAMUSCULAR | Qty: 30 | Status: AC

## 2018-12-14 MED FILL — Gentamicin Sulfate Inj 40 MG/ML: INTRAMUSCULAR | Qty: 80 | Status: AC

## 2018-12-14 NOTE — Progress Notes (Signed)
02 placed on patient at 1L via Alpha for desaturation while asleep down to 77-78%. After waking up, patient goes back up to 93-94%. Event happened 2x before 02 was applied. Patient was asked if she know if she has sleep apnea, patient denies. Resting comfortably at this time. No distress noted. Call light within reach.

## 2018-12-14 NOTE — Progress Notes (Signed)
Orthopedic Tech Progress Note Patient Details:  Jordan Perry July 23, 1956 [id number] RN stated patient has on arm sling Patient ID: Jordan Perry, female   DOB: 03/18/1956, 63 y.o.   MRN: [id number]   [name] 12/14/2018, 7:36 AM

## 2018-12-14 NOTE — Progress Notes (Signed)
Pt left unit for home in wheelchair accompanied by nurse tech.

## 2018-12-24 ENCOUNTER — Ambulatory Visit (INDEPENDENT_AMBULATORY_CARE_PROVIDER_SITE_OTHER): Payer: BLUE CROSS/BLUE SHIELD | Admitting: Nurse Practitioner

## 2018-12-24 DIAGNOSIS — I442 Atrioventricular block, complete: Secondary | ICD-10-CM | POA: Diagnosis not present

## 2018-12-24 LAB — CUP PACEART INCLINIC DEVICE CHECK
Date Time Interrogation Session: 20200302085745
Implantable Lead Implant Date: 20200220
Implantable Lead Implant Date: 20200220
Implantable Lead Location: 753859
Implantable Pulse Generator Implant Date: 20200220
MDC IDC LEAD LOCATION: 753860
Pulse Gen Model: 2272
Pulse Gen Serial Number: 9107223

## 2018-12-24 NOTE — Progress Notes (Signed)

## 2019-01-02 DIAGNOSIS — M5033 Other cervical disc degeneration, cervicothoracic region: Secondary | ICD-10-CM | POA: Diagnosis not present

## 2019-01-02 DIAGNOSIS — M9901 Segmental and somatic dysfunction of cervical region: Secondary | ICD-10-CM | POA: Diagnosis not present

## 2019-01-10 DIAGNOSIS — M5033 Other cervical disc degeneration, cervicothoracic region: Secondary | ICD-10-CM | POA: Diagnosis not present

## 2019-01-10 DIAGNOSIS — M9901 Segmental and somatic dysfunction of cervical region: Secondary | ICD-10-CM | POA: Diagnosis not present

## 2019-01-11 DIAGNOSIS — H33012 Retinal detachment with single break, left eye: Secondary | ICD-10-CM | POA: Diagnosis not present

## 2019-01-11 DIAGNOSIS — H179 Unspecified corneal scar and opacity: Secondary | ICD-10-CM | POA: Diagnosis not present

## 2019-01-11 DIAGNOSIS — E113313 Type 2 diabetes mellitus with moderate nonproliferative diabetic retinopathy with macular edema, bilateral: Secondary | ICD-10-CM | POA: Diagnosis not present

## 2019-01-11 DIAGNOSIS — H2513 Age-related nuclear cataract, bilateral: Secondary | ICD-10-CM | POA: Diagnosis not present

## 2019-01-17 DIAGNOSIS — M9901 Segmental and somatic dysfunction of cervical region: Secondary | ICD-10-CM | POA: Diagnosis not present

## 2019-01-17 DIAGNOSIS — M5033 Other cervical disc degeneration, cervicothoracic region: Secondary | ICD-10-CM | POA: Diagnosis not present

## 2019-01-21 DIAGNOSIS — M5033 Other cervical disc degeneration, cervicothoracic region: Secondary | ICD-10-CM | POA: Diagnosis not present

## 2019-01-21 DIAGNOSIS — M9901 Segmental and somatic dysfunction of cervical region: Secondary | ICD-10-CM | POA: Diagnosis not present

## 2019-02-07 DIAGNOSIS — M9901 Segmental and somatic dysfunction of cervical region: Secondary | ICD-10-CM | POA: Diagnosis not present

## 2019-02-07 DIAGNOSIS — M5033 Other cervical disc degeneration, cervicothoracic region: Secondary | ICD-10-CM | POA: Diagnosis not present

## 2019-02-12 DIAGNOSIS — E1165 Type 2 diabetes mellitus with hyperglycemia: Secondary | ICD-10-CM | POA: Diagnosis not present

## 2019-02-12 DIAGNOSIS — E039 Hypothyroidism, unspecified: Secondary | ICD-10-CM | POA: Diagnosis not present

## 2019-02-12 DIAGNOSIS — E7800 Pure hypercholesterolemia, unspecified: Secondary | ICD-10-CM | POA: Diagnosis not present

## 2019-02-12 DIAGNOSIS — E119 Type 2 diabetes mellitus without complications: Secondary | ICD-10-CM | POA: Diagnosis not present

## 2019-02-14 DIAGNOSIS — M5033 Other cervical disc degeneration, cervicothoracic region: Secondary | ICD-10-CM | POA: Diagnosis not present

## 2019-02-14 DIAGNOSIS — M9901 Segmental and somatic dysfunction of cervical region: Secondary | ICD-10-CM | POA: Diagnosis not present

## 2019-02-19 DIAGNOSIS — E039 Hypothyroidism, unspecified: Secondary | ICD-10-CM | POA: Diagnosis not present

## 2019-02-19 DIAGNOSIS — E1165 Type 2 diabetes mellitus with hyperglycemia: Secondary | ICD-10-CM | POA: Diagnosis not present

## 2019-02-19 DIAGNOSIS — I10 Essential (primary) hypertension: Secondary | ICD-10-CM | POA: Diagnosis not present

## 2019-02-19 DIAGNOSIS — E7800 Pure hypercholesterolemia, unspecified: Secondary | ICD-10-CM | POA: Diagnosis not present

## 2019-02-21 DIAGNOSIS — M9901 Segmental and somatic dysfunction of cervical region: Secondary | ICD-10-CM | POA: Diagnosis not present

## 2019-02-21 DIAGNOSIS — M5033 Other cervical disc degeneration, cervicothoracic region: Secondary | ICD-10-CM | POA: Diagnosis not present

## 2019-02-27 DIAGNOSIS — M5033 Other cervical disc degeneration, cervicothoracic region: Secondary | ICD-10-CM | POA: Diagnosis not present

## 2019-02-27 DIAGNOSIS — M9901 Segmental and somatic dysfunction of cervical region: Secondary | ICD-10-CM | POA: Diagnosis not present

## 2019-03-06 ENCOUNTER — Telehealth: Payer: Self-pay | Admitting: Internal Medicine

## 2019-03-06 NOTE — Telephone Encounter (Signed)
Virtual Visit Pre-Appointment Phone Call  "(Name), I am calling you today to discuss your upcoming appointment. We are currently trying to limit exposure to the virus that causes COVID-19 by seeing patients at home rather than in the office."  1. "What is the BEST phone number to call the day of the visit?" - include this in appointment notes  2. Do you have or have access to (through a family member/friend) a smartphone with video capability that we can use for your visit?" a. If yes - list this number in appt notes as cell (if different from BEST phone #) and list the appointment type as a VIDEO visit in appointment notes b. If no - list the appointment type as a PHONE visit in appointment notes  3. Confirm consent - "In the setting of the current Covid19 crisis, you are scheduled for a (phone or video) visit with your provider on (date) at (time).  Just as we do with many in-office visits, in order for you to participate in this visit, we must obtain consent.  If you'd like, I can send this to your mychart (if signed up) or email for you to review.  Otherwise, I can obtain your verbal consent now.  All virtual visits are billed to your insurance company just like a normal visit would be.  By agreeing to a virtual visit, we'd like you to understand that the technology does not allow for your provider to perform an examination, and thus may limit your provider's ability to fully assess your condition. If your provider identifies any concerns that need to be evaluated in person, we will make arrangements to do so.  Finally, though the technology is pretty good, we cannot assure that it will always work on either your or our end, and in the setting of a video visit, we may have to convert it to a phone-only visit.  In either situation, we cannot ensure that we have a secure connection.  Are you willing to proceed?" STAFF: Did the patient verbally acknowledge consent to telehealth visit? Document  YES/NO here: Yes  4. Advise patient to be prepared - "Two hours prior to your appointment, go ahead and check your blood pressure, pulse, oxygen saturation, and your weight (if you have the equipment to check those) and write them all down. When your visit starts, your provider will ask you for this information. If you have an Apple Watch or Kardia device, please plan to have heart rate information ready on the day of your appointment. Please have a pen and paper handy nearby the day of the visit as well."  5. Give patient instructions for MyChart download to smartphone OR [email or web address] as below if video visit (depending on what platform provider is using)  6. Inform patient they will receive a phone call 15 minutes prior to their appointment time (may be from unknown caller ID) so they should be prepared to answer    TELEPHONE CALL NOTE  Jordan Perry has been deemed a candidate for a follow-up tele-health visit to limit community exposure during the Covid-19 pandemic. I spoke with the patient via phone to ensure availability of phone/video source, confirm preferred email & phone number, and discuss instructions and expectations.  I reminded Jordan Perry to be prepared with any vital sign and/or heart rhythm information that could potentially be obtained via home monitoring, at the time of her visit. I reminded Jordan Perry to expect a phone call prior to  her visit.  Jordan Perry 03/06/2019 1:41 PM

## 2019-03-07 DIAGNOSIS — M5033 Other cervical disc degeneration, cervicothoracic region: Secondary | ICD-10-CM | POA: Diagnosis not present

## 2019-03-07 DIAGNOSIS — M9901 Segmental and somatic dysfunction of cervical region: Secondary | ICD-10-CM | POA: Diagnosis not present

## 2019-03-14 ENCOUNTER — Encounter: Payer: Self-pay | Admitting: Internal Medicine

## 2019-03-14 DIAGNOSIS — H43812 Vitreous degeneration, left eye: Secondary | ICD-10-CM | POA: Diagnosis not present

## 2019-03-14 DIAGNOSIS — Z794 Long term (current) use of insulin: Secondary | ICD-10-CM | POA: Diagnosis not present

## 2019-03-14 DIAGNOSIS — M5033 Other cervical disc degeneration, cervicothoracic region: Secondary | ICD-10-CM | POA: Diagnosis not present

## 2019-03-14 DIAGNOSIS — H2512 Age-related nuclear cataract, left eye: Secondary | ICD-10-CM | POA: Diagnosis not present

## 2019-03-14 DIAGNOSIS — H2511 Age-related nuclear cataract, right eye: Secondary | ICD-10-CM | POA: Diagnosis not present

## 2019-03-14 DIAGNOSIS — M9901 Segmental and somatic dysfunction of cervical region: Secondary | ICD-10-CM | POA: Diagnosis not present

## 2019-03-14 DIAGNOSIS — Z9889 Other specified postprocedural states: Secondary | ICD-10-CM | POA: Diagnosis not present

## 2019-03-14 DIAGNOSIS — E113313 Type 2 diabetes mellitus with moderate nonproliferative diabetic retinopathy with macular edema, bilateral: Secondary | ICD-10-CM | POA: Diagnosis not present

## 2019-03-14 DIAGNOSIS — H52203 Unspecified astigmatism, bilateral: Secondary | ICD-10-CM | POA: Diagnosis not present

## 2019-03-15 ENCOUNTER — Telehealth (INDEPENDENT_AMBULATORY_CARE_PROVIDER_SITE_OTHER): Payer: BLUE CROSS/BLUE SHIELD | Admitting: Internal Medicine

## 2019-03-15 VITALS — BP 117/70 | HR 80 | Ht 64.0 in | Wt 232.0 lb

## 2019-03-15 DIAGNOSIS — I10 Essential (primary) hypertension: Secondary | ICD-10-CM

## 2019-03-15 DIAGNOSIS — I442 Atrioventricular block, complete: Secondary | ICD-10-CM | POA: Diagnosis not present

## 2019-03-15 NOTE — Patient Instructions (Signed)
Medication Instructions: Your physician recommends that you continue on your current medications as directed. Please refer to the Current Medication list given to you today.   Labwork: None today  Procedures/Testing: None today  Follow-Up: 3 months Dr.Taylor  6 months remote  Any Additional Special Instructions Will Be Listed Below (If Applicable).     If you need a refill on your cardiac medications before your next appointment, please call your pharmacy.     Thank you for choosing [facility] !

## 2019-03-15 NOTE — Progress Notes (Signed)
Electrophysiology TeleHealth Note   Due to national recommendations of social distancing due to COVID 19, an audio/video telehealth visit is felt to be most appropriate for this patient at this time.  See MyChart message from today for the patient's consent to telehealth for [facility]. The patient could not participate in a video call due to technical problems with her phone.   Date:  03/15/2019   ID:  Jordan, Perry [birth year], MRN [id number]  Location: patient's home  Provider location: [address], [place]  Evaluation Performed: Follow-up visit  PCP:  Sharilyn Sites, MD  Cardiologist:  Carlyle Dolly, MD  Electrophysiologist:  Dr. Lovena Le  Chief Complaint:  "I've got more energy. I've got "  History of Present Illness:    Jordan Perry is a 63 y.o. female who presents via audio/video conferencing for a telehealth visit today. She is a pleasant morbidly obese middle aged woman with CHB and a narrow escape who presented to the hospital with symptomatic bradycardia 3 months ago. She underwent insertion of a DDD PPM. Her incision check visit was good. In the interim, she has done well. She has had some stinging where her incision is located but no swelling or redness. The device is not tender.   Today, she denies symptoms of palpitations, chest pain, shortness of breath,  lower extremity edema, dizziness, presyncope, or syncope.  The patient is otherwise without complaint today.  The patient denies symptoms of fevers, chills, cough, or new SOB worrisome for COVID 19.  Past Medical History:  Diagnosis Date  . Allergic rhinitis, cause unspecified   . Arthritis   . Coronary atherosclerosis of unspecified type of vessel, native or graft   . GERD (gastroesophageal reflux disease)   . Hypothyroidism   . Proteinuria   . Pure hyperglyceridemia   . Type II or unspecified type diabetes mellitus without mention of complication, not stated as uncontrolled   .  Unspecified essential hypertension     Past Surgical History:  Procedure Laterality Date  . CYSTECTOMY  1989   removed from end of spine  . PACEMAKER IMPLANT N/A 12/13/2018   Procedure: PACEMAKER IMPLANT;  Surgeon: Evans Lance, MD;  Location: [facility];  Service: Cardiovascular;  Laterality: N/A;  . REFRACTIVE SURGERY  1994    Current Outpatient Medications  Medication Sig Dispense Refill  . acetaminophen (TYLENOL) 500 MG tablet Take 1,000-1,500 mg by mouth every 6 (six) hours as needed (for headaches.).    Marland Kitchen aspirin EC 81 MG tablet Take 81 mg by mouth daily.    Marland Kitchen atorvastatin (LIPITOR) 80 MG tablet TAKE 1 TABLET DAILY (Patient taking differently: Take 80 mg by mouth daily. ) 90 tablet 4  . furosemide (LASIX) 20 MG tablet TAKE 1 TABLET AS NEEDED (Patient taking differently: Take 20 mg by mouth daily as needed for fluid or edema. ) 90 tablet 1  . HUMALOG KWIKPEN 100 UNIT/ML KiwkPen Inject 30 Units into the skin 3 (three) times daily with meals.     Marland Kitchen HUMULIN N KWIKPEN 100 UNIT/ML Kiwkpen Inject 60 Units into the skin 2 (two) times daily.     Marland Kitchen lisinopril (PRINIVIL,ZESTRIL) 20 MG tablet TAKE 1 TABLET DAILY (Patient taking differently: Take 20 mg by mouth daily. ) 90 tablet 3  . metFORMIN (GLUCOPHAGE-XR) 500 MG 24 hr tablet Take 500 mg by mouth 2 (two) times daily after a meal.     . Multiple Vitamins-Minerals (CENTRUM ADULTS PO) Take  1 tablet by mouth daily.    . Probiotic Product (ALIGN) 4 MG CAPS Take 1 capsule by mouth daily.    Marland Kitchen SYNTHROID 125 MCG tablet Take 125 mcg by mouth every evening.     . Na Sulfate-K Sulfate-Mg Sulf (SUPREP BOWEL PREP KIT) 17.5-3.13-1.6 GM/177ML SOLN Take 1 kit by mouth as directed. (Patient not taking: Reported on 12/12/2018) 1 Bottle 0   No current facility-administered medications for this visit.     Allergies:   Invokana [canagliflozin]; Janumet [sitagliptin-metformin hcl]; Penicillins; and Prednisone   Social History:  The patient  reports  that she has never smoked. She has never used smokeless tobacco. She reports that she does not drink alcohol or use drugs.   Family History:  The patient's  family history includes COPD (age of onset: 48) in her mother; Cancer (age of onset: 76) in her father; Coronary artery disease in her sister and another family member; Diabetes in an other family member; Heart attack (age of onset: 72) in her brother; Heart disease in her father; Hyperlipidemia in an other family member; Hypertension in an other family member; Obesity in an other family member.   ROS:  Please see the history of present illness.   All other systems are personally reviewed and negative.    Exam:    Vital Signs:  BP 117/70   Pulse 80   Ht '5\' 4"'  (1.626 m)   Wt 232 lb (105.2 kg)   BMI 39.82 kg/m    Labs/Other Tests and Data Reviewed:    Recent Labs: 12/12/2018: ALT 25; BUN 16; Creatinine, Ser 0.72; Hemoglobin 10.2; Magnesium 1.9; Platelets 238; Potassium 4.4; Sodium 137; TSH 2.249   Wt Readings from Last 3 Encounters:  03/14/19 232 lb (105.2 kg)  12/12/18 242 lb 15.2 oz (110.2 kg)  12/12/18 238 lb (108 kg)     Other studies personally reviewed: Additional studies/ records that were reviewed today include:   Last device remote is reviewed from [facility] PDF dated 3/20 which reveals normal device function, no arrhythmias    ASSESSMENT & PLAN:    1.  CHB - she is asymptomatic, s/p PPM insertion. 2. Obesity - she is encouraged to lose weight. 3. HTN - her bp is ok.  4. COVID 19 screen The patient denies symptoms of COVID 19 at this time.  The importance of social distancing was discussed today.  Follow-up:  3 months in the office Next remote: 6 months  Current medicines are reviewed at length with the patient today.   The patient does not have concerns regarding her medicines.  The following changes were made today:  none  Labs/ tests ordered today include: none No orders of the defined types were placed in  this encounter.    Patient Risk:  after full review of this patients clinical status, I feel that they are at moderate risk at this time.  Today, I have spent 15 minutes with the patient with telehealth technology discussing all of the above .    Signed, Cristopher Peru, MD  03/15/2019 8:32 AM     [facility] [address] [place] [address] [phone] (office) [phone] (fax)

## 2019-03-19 ENCOUNTER — Encounter: Payer: BLUE CROSS/BLUE SHIELD | Admitting: Internal Medicine

## 2019-03-22 DIAGNOSIS — Z1389 Encounter for screening for other disorder: Secondary | ICD-10-CM | POA: Diagnosis not present

## 2019-03-22 DIAGNOSIS — E119 Type 2 diabetes mellitus without complications: Secondary | ICD-10-CM | POA: Diagnosis not present

## 2019-03-22 DIAGNOSIS — I10 Essential (primary) hypertension: Secondary | ICD-10-CM | POA: Diagnosis not present

## 2019-03-22 DIAGNOSIS — Z0001 Encounter for general adult medical examination with abnormal findings: Secondary | ICD-10-CM | POA: Diagnosis not present

## 2019-03-22 DIAGNOSIS — Z6841 Body Mass Index (BMI) 40.0 and over, adult: Secondary | ICD-10-CM | POA: Diagnosis not present

## 2019-03-22 DIAGNOSIS — F419 Anxiety disorder, unspecified: Secondary | ICD-10-CM | POA: Diagnosis not present

## 2019-03-27 DIAGNOSIS — E039 Hypothyroidism, unspecified: Secondary | ICD-10-CM | POA: Diagnosis not present

## 2019-03-27 DIAGNOSIS — E1165 Type 2 diabetes mellitus with hyperglycemia: Secondary | ICD-10-CM | POA: Diagnosis not present

## 2019-03-27 DIAGNOSIS — E7800 Pure hypercholesterolemia, unspecified: Secondary | ICD-10-CM | POA: Diagnosis not present

## 2019-03-27 DIAGNOSIS — I10 Essential (primary) hypertension: Secondary | ICD-10-CM | POA: Diagnosis not present

## 2019-03-28 DIAGNOSIS — M9901 Segmental and somatic dysfunction of cervical region: Secondary | ICD-10-CM | POA: Diagnosis not present

## 2019-03-28 DIAGNOSIS — M5033 Other cervical disc degeneration, cervicothoracic region: Secondary | ICD-10-CM | POA: Diagnosis not present

## 2019-04-04 DIAGNOSIS — Z794 Long term (current) use of insulin: Secondary | ICD-10-CM | POA: Diagnosis not present

## 2019-04-04 DIAGNOSIS — M5033 Other cervical disc degeneration, cervicothoracic region: Secondary | ICD-10-CM | POA: Diagnosis not present

## 2019-04-04 DIAGNOSIS — M9901 Segmental and somatic dysfunction of cervical region: Secondary | ICD-10-CM | POA: Diagnosis not present

## 2019-04-04 DIAGNOSIS — Z9889 Other specified postprocedural states: Secondary | ICD-10-CM | POA: Diagnosis not present

## 2019-04-04 DIAGNOSIS — E113313 Type 2 diabetes mellitus with moderate nonproliferative diabetic retinopathy with macular edema, bilateral: Secondary | ICD-10-CM | POA: Diagnosis not present

## 2019-04-04 DIAGNOSIS — H259 Unspecified age-related cataract: Secondary | ICD-10-CM | POA: Diagnosis not present

## 2019-04-11 DIAGNOSIS — M9901 Segmental and somatic dysfunction of cervical region: Secondary | ICD-10-CM | POA: Diagnosis not present

## 2019-04-11 DIAGNOSIS — M5033 Other cervical disc degeneration, cervicothoracic region: Secondary | ICD-10-CM | POA: Diagnosis not present

## 2019-04-15 DIAGNOSIS — H259 Unspecified age-related cataract: Secondary | ICD-10-CM | POA: Diagnosis not present

## 2019-04-15 DIAGNOSIS — H524 Presbyopia: Secondary | ICD-10-CM | POA: Diagnosis not present

## 2019-04-15 DIAGNOSIS — Z9889 Other specified postprocedural states: Secondary | ICD-10-CM | POA: Diagnosis not present

## 2019-04-15 DIAGNOSIS — E113313 Type 2 diabetes mellitus with moderate nonproliferative diabetic retinopathy with macular edema, bilateral: Secondary | ICD-10-CM | POA: Diagnosis not present

## 2019-04-18 DIAGNOSIS — M9901 Segmental and somatic dysfunction of cervical region: Secondary | ICD-10-CM | POA: Diagnosis not present

## 2019-04-18 DIAGNOSIS — M5033 Other cervical disc degeneration, cervicothoracic region: Secondary | ICD-10-CM | POA: Diagnosis not present

## 2019-04-19 DIAGNOSIS — Z01812 Encounter for preprocedural laboratory examination: Secondary | ICD-10-CM | POA: Diagnosis not present

## 2019-04-19 DIAGNOSIS — Z1159 Encounter for screening for other viral diseases: Secondary | ICD-10-CM | POA: Diagnosis not present

## 2019-04-19 DIAGNOSIS — H2511 Age-related nuclear cataract, right eye: Secondary | ICD-10-CM | POA: Diagnosis not present

## 2019-04-22 DIAGNOSIS — Z880 Allergy status to penicillin: Secondary | ICD-10-CM | POA: Diagnosis not present

## 2019-04-22 DIAGNOSIS — E1136 Type 2 diabetes mellitus with diabetic cataract: Secondary | ICD-10-CM | POA: Diagnosis not present

## 2019-04-22 DIAGNOSIS — K219 Gastro-esophageal reflux disease without esophagitis: Secondary | ICD-10-CM | POA: Diagnosis not present

## 2019-04-22 DIAGNOSIS — H259 Unspecified age-related cataract: Secondary | ICD-10-CM | POA: Diagnosis not present

## 2019-04-22 DIAGNOSIS — Z888 Allergy status to other drugs, medicaments and biological substances status: Secondary | ICD-10-CM | POA: Diagnosis not present

## 2019-04-22 DIAGNOSIS — H2511 Age-related nuclear cataract, right eye: Secondary | ICD-10-CM | POA: Diagnosis not present

## 2019-04-22 DIAGNOSIS — I2510 Atherosclerotic heart disease of native coronary artery without angina pectoris: Secondary | ICD-10-CM | POA: Diagnosis not present

## 2019-04-22 DIAGNOSIS — Z79899 Other long term (current) drug therapy: Secondary | ICD-10-CM | POA: Diagnosis not present

## 2019-04-22 DIAGNOSIS — E785 Hyperlipidemia, unspecified: Secondary | ICD-10-CM | POA: Diagnosis not present

## 2019-04-22 DIAGNOSIS — Z7982 Long term (current) use of aspirin: Secondary | ICD-10-CM | POA: Diagnosis not present

## 2019-04-22 DIAGNOSIS — Z794 Long term (current) use of insulin: Secondary | ICD-10-CM | POA: Diagnosis not present

## 2019-05-02 DIAGNOSIS — M9901 Segmental and somatic dysfunction of cervical region: Secondary | ICD-10-CM | POA: Diagnosis not present

## 2019-05-02 DIAGNOSIS — M5033 Other cervical disc degeneration, cervicothoracic region: Secondary | ICD-10-CM | POA: Diagnosis not present

## 2019-05-09 DIAGNOSIS — M5033 Other cervical disc degeneration, cervicothoracic region: Secondary | ICD-10-CM | POA: Diagnosis not present

## 2019-05-09 DIAGNOSIS — M9901 Segmental and somatic dysfunction of cervical region: Secondary | ICD-10-CM | POA: Diagnosis not present

## 2019-05-13 DIAGNOSIS — M9901 Segmental and somatic dysfunction of cervical region: Secondary | ICD-10-CM | POA: Diagnosis not present

## 2019-05-13 DIAGNOSIS — M5033 Other cervical disc degeneration, cervicothoracic region: Secondary | ICD-10-CM | POA: Diagnosis not present

## 2019-05-16 ENCOUNTER — Other Ambulatory Visit: Payer: Self-pay | Admitting: Cardiology

## 2019-05-17 DIAGNOSIS — H33012 Retinal detachment with single break, left eye: Secondary | ICD-10-CM | POA: Diagnosis not present

## 2019-05-17 DIAGNOSIS — H2512 Age-related nuclear cataract, left eye: Secondary | ICD-10-CM | POA: Diagnosis not present

## 2019-05-17 DIAGNOSIS — H179 Unspecified corneal scar and opacity: Secondary | ICD-10-CM | POA: Diagnosis not present

## 2019-05-17 DIAGNOSIS — E113313 Type 2 diabetes mellitus with moderate nonproliferative diabetic retinopathy with macular edema, bilateral: Secondary | ICD-10-CM | POA: Diagnosis not present

## 2019-05-20 DIAGNOSIS — M5033 Other cervical disc degeneration, cervicothoracic region: Secondary | ICD-10-CM | POA: Diagnosis not present

## 2019-05-20 DIAGNOSIS — M9901 Segmental and somatic dysfunction of cervical region: Secondary | ICD-10-CM | POA: Diagnosis not present

## 2019-05-28 DIAGNOSIS — M5033 Other cervical disc degeneration, cervicothoracic region: Secondary | ICD-10-CM | POA: Diagnosis not present

## 2019-05-28 DIAGNOSIS — M9901 Segmental and somatic dysfunction of cervical region: Secondary | ICD-10-CM | POA: Diagnosis not present

## 2019-06-03 DIAGNOSIS — M9901 Segmental and somatic dysfunction of cervical region: Secondary | ICD-10-CM | POA: Diagnosis not present

## 2019-06-03 DIAGNOSIS — M5033 Other cervical disc degeneration, cervicothoracic region: Secondary | ICD-10-CM | POA: Diagnosis not present

## 2019-06-10 DIAGNOSIS — M5033 Other cervical disc degeneration, cervicothoracic region: Secondary | ICD-10-CM | POA: Diagnosis not present

## 2019-06-10 DIAGNOSIS — M9901 Segmental and somatic dysfunction of cervical region: Secondary | ICD-10-CM | POA: Diagnosis not present

## 2019-06-17 DIAGNOSIS — H33012 Retinal detachment with single break, left eye: Secondary | ICD-10-CM | POA: Diagnosis not present

## 2019-06-17 DIAGNOSIS — H179 Unspecified corneal scar and opacity: Secondary | ICD-10-CM | POA: Diagnosis not present

## 2019-06-17 DIAGNOSIS — E113313 Type 2 diabetes mellitus with moderate nonproliferative diabetic retinopathy with macular edema, bilateral: Secondary | ICD-10-CM | POA: Diagnosis not present

## 2019-06-18 ENCOUNTER — Ambulatory Visit (INDEPENDENT_AMBULATORY_CARE_PROVIDER_SITE_OTHER): Payer: BC Managed Care – PPO | Admitting: Internal Medicine

## 2019-06-18 ENCOUNTER — Other Ambulatory Visit: Payer: Self-pay

## 2019-06-18 ENCOUNTER — Encounter: Payer: Self-pay | Admitting: Internal Medicine

## 2019-06-18 VITALS — BP 116/68 | HR 88 | Temp 97.1°F | Ht 64.0 in | Wt 250.0 lb

## 2019-06-18 DIAGNOSIS — M9901 Segmental and somatic dysfunction of cervical region: Secondary | ICD-10-CM | POA: Diagnosis not present

## 2019-06-18 DIAGNOSIS — I442 Atrioventricular block, complete: Secondary | ICD-10-CM

## 2019-06-18 DIAGNOSIS — M5033 Other cervical disc degeneration, cervicothoracic region: Secondary | ICD-10-CM | POA: Diagnosis not present

## 2019-06-18 LAB — CUP PACEART INCLINIC DEVICE CHECK
Battery Remaining Longevity: 116 mo
Battery Voltage: 2.98 V
Brady Statistic RA Percent Paced: 0.34 %
Brady Statistic RV Percent Paced: 99.98 %
Date Time Interrogation Session: 20200825092500
Implantable Lead Implant Date: 20200220
Implantable Lead Implant Date: 20200220
Implantable Lead Location: 753859
Implantable Lead Location: 753860
Implantable Pulse Generator Implant Date: 20200220
Lead Channel Impedance Value: 475 Ohm
Lead Channel Impedance Value: 537.5 Ohm
Lead Channel Pacing Threshold Amplitude: 0.5 V
Lead Channel Pacing Threshold Amplitude: 0.5 V
Lead Channel Pacing Threshold Pulse Width: 0.5 ms
Lead Channel Pacing Threshold Pulse Width: 0.5 ms
Lead Channel Sensing Intrinsic Amplitude: 4.2 mV
Lead Channel Sensing Intrinsic Amplitude: 9.6 mV
Lead Channel Setting Pacing Amplitude: 0.75 V
Lead Channel Setting Pacing Amplitude: 1.5 V
Lead Channel Setting Pacing Pulse Width: 0.5 ms
Lead Channel Setting Sensing Sensitivity: 4 mV
Pulse Gen Model: 2272
Pulse Gen Serial Number: 9107223

## 2019-06-18 NOTE — Patient Instructions (Signed)
Medication Instructions:  Your physician recommends that you continue on your current medications as directed. Please refer to the Current Medication list given to you today.   If you need a refill on your cardiac medications before your next appointment, please call your pharmacy.   Lab work: NONE  If you have labs (blood work) drawn today and your tests are completely normal, you will receive your results only by: Marland Kitchen MyChart Message (if you have MyChart) OR . A paper copy in the mail If you have any lab test that is abnormal or we need to change your treatment, we will call you to review the results.  Testing/Procedures: NONE   Follow-Up: At [facility], you and your health needs are our priority.  As part of our continuing mission to provide you with exceptional heart care, we have created designated Provider Care Teams.  These Care Teams include your primary Cardiologist (physician) and Advanced Practice Providers (APPs -  Physician Assistants and Nurse Practitioners) who all work together to provide you with the care you need, when you need it. You will need a follow up appointment in 7 months.  Please call our office 2 months in advance to schedule this appointment.  You may see Dr. Lovena Le. or one of the following Advanced Practice Providers on your designated Care Team:   [place], PA-C [facility]) . Ermalinda Barrios, PA-C ([place])  Any Other Special Instructions Will Be Listed Below (If Applicable). Thank you for choosing [facility]!

## 2019-06-18 NOTE — Progress Notes (Signed)
HPI Jordan Perry returns today for PPM followup. She presented with CHB in February. She underwent PPM insertion at that time. She has done well in the interim. Unfortunately she has not lost any weight. No chest pain or sob. She is back to work. No syncope.  Allergies  Allergen Reactions   Invokana [Canagliflozin] Other (See Comments)    Skin peeling   Janumet [Sitagliptin-Metformin Hcl] Other (See Comments)    Skin peeling   Penicillins     Has patient had a PCN reaction causing immediate rash, facial/tongue/throat swelling, SOB or lightheadedness with hypotension: Yes-immediate rash Has patient had a PCN reaction causing severe rash involving mucus membranes or skin necrosis: Yes Has patient had a PCN reaction that required hospitalization: No Has patient had a PCN reaction occurring within the last 10 years: No If all of the above answers are "NO", then may proceed with Cephalosporin use.    Prednisone Rash     Current Outpatient Medications  Medication Sig Dispense Refill   acetaminophen (TYLENOL) 500 MG tablet Take 1,000-1,500 mg by mouth every 6 (six) hours as needed (for headaches.).     aspirin EC 81 MG tablet Take 81 mg by mouth daily.     atorvastatin (LIPITOR) 80 MG tablet TAKE 1 TABLET DAILY (Patient taking differently: Take 80 mg by mouth daily. ) 90 tablet 4   furosemide (LASIX) 20 MG tablet TAKE 1 TABLET AS NEEDED (Patient taking differently: Take 20 mg by mouth daily as needed for fluid or edema. ) 90 tablet 1   HUMALOG KWIKPEN 100 UNIT/ML KiwkPen Inject 30 Units into the skin 3 (three) times daily with meals.      HUMULIN N KWIKPEN 100 UNIT/ML Kiwkpen Inject 60 Units into the skin 2 (two) times daily.      lisinopril (ZESTRIL) 20 MG tablet Take 1 tablet (20 mg total) by mouth daily. 90 tablet 3   metFORMIN (GLUCOPHAGE-XR) 500 MG 24 hr tablet Take 500 mg by mouth 2 (two) times daily after a meal.      Multiple Vitamins-Minerals (CENTRUM ADULTS PO)  Take 1 tablet by mouth daily.     Na Sulfate-K Sulfate-Mg Sulf (SUPREP BOWEL PREP KIT) 17.5-3.13-1.6 GM/177ML SOLN Take 1 kit by mouth as directed. 1 Bottle 0   Probiotic Product (ALIGN) 4 MG CAPS Take 1 capsule by mouth daily.     SYNTHROID 125 MCG tablet Take 125 mcg by mouth every evening.      No current facility-administered medications for this visit.      Past Medical History:  Diagnosis Date   Allergic rhinitis, cause unspecified    Arthritis    Coronary atherosclerosis of unspecified type of vessel, native or graft    GERD (gastroesophageal reflux disease)    Hypothyroidism    Proteinuria    Pure hyperglyceridemia    Type II or unspecified type diabetes mellitus without mention of complication, not stated as uncontrolled    Unspecified essential hypertension     ROS:   All systems reviewed and negative except as noted in the HPI.   Past Surgical History:  Procedure Laterality Date   CYSTECTOMY  1989   removed from end of spine   PACEMAKER IMPLANT N/A 12/13/2018   Procedure: PACEMAKER IMPLANT;  Surgeon: Evans Lance, MD;  Location: [facility];  Service: Cardiovascular;  Laterality: N/A;   REFRACTIVE SURGERY  1994     Family History  Problem Relation Age of Onset  COPD Mother 44   Heart disease Father    Cancer Father 3   Coronary artery disease Other    Diabetes Other    Obesity Other    Heart attack Brother 18   Coronary artery disease Sister    Hyperlipidemia Other    Hypertension Other    Colon cancer Neg Hx      Social History   Socioeconomic History   Marital status: Married    Spouse name: Not on file   Number of children: 0   Years of education: Not on file   Highest education level: Not on file  Occupational History   Occupation: Producer, 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[employer]    Comment: Doctor, hospital, 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  Social Designer, fashion/clothing strain: Not on file   Food insecurity    Worry: Not on  file    Inability: Not on file   Transportation needs    Medical: Not on file    Non-medical: Not on file  Tobacco Use   Smoking status: Never Smoker   Smokeless tobacco: Never Used  Substance and Sexual Activity   Alcohol use: No    Alcohol/week: 0.0 standard drinks   Drug use: No   Sexual activity: Not on file  Lifestyle   Physical activity    Days per week: Not on file    Minutes per session: Not on file   Stress: Not on file  Relationships   Social connections    Talks on phone: Not on file    Gets together: Not on file    Attends religious service: Not on file    Active member of club or organization: Not on file    Attends meetings of clubs or organizations: Not on file    Relationship status: Not on file   Intimate partner violence    Fear of current or ex partner: Not on file    Emotionally abused: Not on file    Physically abused: Not on file    Forced sexual activity: Not on file  Other Topics Concern   Not on file  Social History Narrative   Not on file     BP 116/68    Pulse 88    Temp (!) 97.1 F (36.2 C)    Ht '5\' 4"'  (1.626 m)    Wt 250 lb (113.4 kg)    SpO2 97%    BMI 42.91 kg/m   Physical Exam:  Well appearing NAD HEENT: Unremarkable Neck:  No JVD, no thyromegally Lymphatics:  No adenopathy Back:  No CVA tenderness Lungs:  Clear with no wheezes HEART:  IRegular rate rhythm, no murmurs, no rubs, no clicks Abd:  soft, positive bowel sounds, no organomegally, no rebound, no guarding Ext:  2 plus pulses, no edema, no cyanosis, no clubbing Skin:  No rashes no nodules Neuro:  CN II through XII intact, motor grossly intact  DEVICE  Normal device function.  See PaceArt for details.   Assess/Plan: 1. CHB - she is asymptomatic, s/p PPM insertion.  2. PPM - her St. Jude DDD PM is working normally. We will recheck in several months. 3. Obesity - she is encouraged to lose weight.   Mikle Bosworth.D.

## 2019-06-24 ENCOUNTER — Telehealth: Payer: Self-pay

## 2019-06-24 DIAGNOSIS — M5033 Other cervical disc degeneration, cervicothoracic region: Secondary | ICD-10-CM | POA: Diagnosis not present

## 2019-06-24 DIAGNOSIS — M9901 Segmental and somatic dysfunction of cervical region: Secondary | ICD-10-CM | POA: Diagnosis not present

## 2019-06-24 NOTE — Telephone Encounter (Signed)
Pt called back and scheduled a nurse visit for 07/29/2019.

## 2019-06-24 NOTE — Telephone Encounter (Signed)
Lmom for pt to call me back. 

## 2019-06-24 NOTE — Telephone Encounter (Signed)
[name], can you call the patient and see if she is ready to reschedule her tcs? If she is, she will just need a nurse visit.

## 2019-06-24 NOTE — Telephone Encounter (Signed)
-----   Message from Mahala Menghini, PA-C sent at 05/15/2019 11:39 AM EDT ----- Reviewed records. She had pacemaker placed 11/2018. Seen in follow up by cardiology 02/2019 without concerns. Ok to triage for colonoscopy.  ----- Message ----- From: Claudina Lick, LPN Sent: [id number]   1:13 PM EDT To: Corky Downs, please see the note from RMR- can you look at this and let me know. thanks ----- Message ----- From: Daneil Dolin, MD Sent: 05/09/2019   1:04 PM EDT To: Claudina Lick, LPN  I think a thoughtful triage is all that is needed; plse have app check cardiology notes and they can decide ----- Message ----- From: Claudina Lick, LPN Sent: [id number]  11:43 AM EDT To: Daneil Dolin, MD  When should this patient have her tcs? She was scheduled in Feb but you sent her to the ED from endo with a heart block.  She is on the recall list for august but I wasn't sure if she should wait or does she need an office visit?

## 2019-07-11 DIAGNOSIS — M5033 Other cervical disc degeneration, cervicothoracic region: Secondary | ICD-10-CM | POA: Diagnosis not present

## 2019-07-11 DIAGNOSIS — M9901 Segmental and somatic dysfunction of cervical region: Secondary | ICD-10-CM | POA: Diagnosis not present

## 2019-07-15 DIAGNOSIS — H33012 Retinal detachment with single break, left eye: Secondary | ICD-10-CM | POA: Diagnosis not present

## 2019-07-15 DIAGNOSIS — E113313 Type 2 diabetes mellitus with moderate nonproliferative diabetic retinopathy with macular edema, bilateral: Secondary | ICD-10-CM | POA: Diagnosis not present

## 2019-07-15 DIAGNOSIS — H179 Unspecified corneal scar and opacity: Secondary | ICD-10-CM | POA: Diagnosis not present

## 2019-07-25 DIAGNOSIS — M5033 Other cervical disc degeneration, cervicothoracic region: Secondary | ICD-10-CM | POA: Diagnosis not present

## 2019-07-25 DIAGNOSIS — M9901 Segmental and somatic dysfunction of cervical region: Secondary | ICD-10-CM | POA: Diagnosis not present

## 2019-07-29 ENCOUNTER — Ambulatory Visit (INDEPENDENT_AMBULATORY_CARE_PROVIDER_SITE_OTHER): Payer: Self-pay | Admitting: *Deleted

## 2019-07-29 ENCOUNTER — Other Ambulatory Visit: Payer: Self-pay

## 2019-07-29 DIAGNOSIS — Z1211 Encounter for screening for malignant neoplasm of colon: Secondary | ICD-10-CM

## 2019-07-29 MED ORDER — NA SULFATE-K SULFATE-MG SULF 17.5-3.13-1.6 GM/177ML PO SOLN: 1.0000 | Freq: Once | ORAL | 0 refills | Status: AC

## 2019-07-29 NOTE — Progress Notes (Addendum)
Gastroenterology Pre-Procedure Review  Request Date: 07/29/2019 Requesting Physician: Dr. Hilma Favors, Last TCS 07/06/2009 done by Dr. Gala Romney, polypoid colonic mucosa, last proc cx due to heart issues, see phone note 06/24/2019  PATIENT REVIEW QUESTIONS: The patient responded to the following health history questions as indicated:    1. Diabetes Melitis: yes 2. Joint replacements in the past 12 months: no 3. Major health problems in the past 3 months: no 4. Has an artificial valve or MVP: no 5. Has a defibrillator: yes, pacemaker placed 11/2018 6. Has been advised in past to take antibiotics in advance of a procedure like teeth cleaning: no 7. Family history of colon cancer: no  8. Alcohol Use: no 9. Illicit drug Use: no 10. History of sleep apnea: no  11. History of coronary artery or other vascular stents placed within the last 12 months: no 12. History of any prior anesthesia complications: no 13. There is no height or weight on file to calculate BMI. ht: 5'4 wt: 250 lbs    MEDICATIONS & ALLERGIES:    Patient reports the following regarding taking any blood thinners:   Plavix? no Aspirin? yes Coumadin? no Brilinta? no Xarelto? no Eliquis? no Pradaxa? no Savaysa? no Effient? no  Patient confirms/reports the following medications:  Current Outpatient Medications  Medication Sig Dispense Refill  . acetaminophen (TYLENOL) 500 MG tablet Take 1,000-1,500 mg by mouth as needed (for headaches.).     Marland Kitchen aspirin EC 81 MG tablet Take 81 mg by mouth daily.    Marland Kitchen atorvastatin (LIPITOR) 80 MG tablet TAKE 1 TABLET DAILY (Patient taking differently: Take 80 mg by mouth daily. ) 90 tablet 4  . furosemide (LASIX) 20 MG tablet TAKE 1 TABLET AS NEEDED (Patient taking differently: Take 20 mg by mouth daily as needed for fluid or edema. ) 90 tablet 1  . HUMALOG KWIKPEN 100 UNIT/ML KiwkPen Inject 30 Units into the skin 3 (three) times daily with meals.     Marland Kitchen HUMULIN N KWIKPEN 100 UNIT/ML Kiwkpen Inject 60  Units into the skin 2 (two) times daily.     Marland Kitchen lisinopril (ZESTRIL) 20 MG tablet Take 1 tablet (20 mg total) by mouth daily. 90 tablet 3  . metFORMIN (GLUCOPHAGE-XR) 500 MG 24 hr tablet Take 500 mg by mouth 2 (two) times daily after a meal.     . Multiple Vitamins-Minerals (CENTRUM ADULTS PO) Take 1 tablet by mouth daily.    . Probiotic Product (ALIGN) 4 MG CAPS Take 1 capsule by mouth daily.    Marland Kitchen SYNTHROID 125 MCG tablet Take 125 mcg by mouth every evening.      No current facility-administered medications for this visit.     Patient confirms/reports the following allergies:  Allergies  Allergen Reactions  . Invokana [Canagliflozin] Other (See Comments)    Skin peeling  . Janumet [Sitagliptin-Metformin Hcl] Other (See Comments)    Skin peeling  . Penicillins     Has patient had a PCN reaction causing immediate rash, facial/tongue/throat swelling, SOB or lightheadedness with hypotension: Yes-immediate rash Has patient had a PCN reaction causing severe rash involving mucus membranes or skin necrosis: Yes Has patient had a PCN reaction that required hospitalization: No Has patient had a PCN reaction occurring within the last 10 years: No If all of the above answers are "NO", then may proceed with Cephalosporin use.   . Prednisone Rash    No orders of the defined types were placed in this encounter.   AUTHORIZATION INFORMATION Primary Insurance: [employer]  of [place],  ID #: [id number] ,  Group #: [id number] Pre-Cert / Josem Kaufmann required: No, file to local BCBS   SCHEDULE INFORMATION: Procedure has been scheduled as follows:  Date: 10/02/2019, Time: 9:30 Location: [facility] with Dr. Gala Romney  This Gastroenterology Pre-Precedure Review Form is being routed to the following provider(s): Neil Crouch, PA-C

## 2019-07-29 NOTE — Patient Instructions (Signed)
Jordan Perry  [birth year] MRN: [id number]     Procedure Date: 10/02/2019 Time to register: 8:30 am Place to register: [facility] Procedure Time: 9:30 am Scheduled provider: Dr. Gala Romney    PREPARATION FOR COLONOSCOPY WITH SUPREP BOWEL PREP KIT  Note: Suprep Bowel Prep Kit is a split-dose (2day) regimen. Consumption of BOTH 6-ounce bottles is required for a complete prep.  Please notify us immediately if you are diabetic, take iron supplements, or if you are on Coumadin or any other blood thinners.  Please hold the following medications: See letter                                                                                                                                                  2 DAYS BEFORE PROCEDURE:  DATE: 09/30/2019   DAY: Monday Begin clear liquid diet AFTER your lunch meal. NO SOLID FOODS after this point.  1 DAY BEFORE PROCEDURE:  DATE: 10/01/2019   DAY: Tuesday Continue clear liquids the entire day - NO SOLID FOOD.   Diabetic medications adjustments for today: See letter  At 6:00pm: Complete steps 1 through 4 below, using ONE (1) 6-ounce bottle, before going to bed. Step 1:  Pour ONE (1) 6-ounce bottle of SUPREP liquid into the mixing container.  Step 2:  Add cool drinking water to the 16 ounce line on the container and mix.  Note: Dilute the solution concentrate as directed prior to use. Step 3:  DRINK ALL the liquid in the container. Step 4:  You MUST drink an additional two (2) or more 16 ounce containers of water over the next one (1) hour.   Continue clear liquids.  DAY OF PROCEDURE:   DATE: 10/02/2019   DAY: Wednesday If you take medications for your heart, blood pressure, or breathing, you may take these medications.  Diabetic medications adjustments for today: See letter  5 hours before your procedure at :  4:30 am Step 1:  Pour ONE (1) 6-ounce bottle of SUPREP liquid into the mixing container.  Step 2:  Add cool drinking water to the 16  ounce line on the container and mix.  Note: Dilute the solution concentrate as directed prior to use. Step 3:  DRINK ALL the liquid in the container. Step 4:  You MUST drink an additional two (2) or more 16 ounce containers of water over the next one (1) hour. You MUST complete the final glass of water at least 3 hours before your colonoscopy. Nothing by mouth past 6:30 am  You may take your morning medications with sip of water unless we have instructed otherwise.    Please see below for Dietary Information.  CLEAR LIQUIDS INCLUDE:  Water Jello (NOT red in color)   Ice Popsicles (NOT red in color)   Tea (sugar ok, no milk/cream) Powdered fruit flavored drinks  Coffee (sugar ok, no milk/cream) Gatorade/ Lemonade/ Kool-Aid  (NOT red in color)   Juice: apple, white grape, white cranberry Soft drinks  Clear bullion, consomme, broth (fat free beef/chicken/vegetable)  Carbonated beverages (any kind)  Strained chicken noodle soup Hard Candy   Remember: Clear liquids are liquids that will allow you to see your fingers on the other side of a clear glass. Be sure liquids are NOT red in color, and not cloudy, but CLEAR.  DO NOT EAT OR DRINK ANY OF THE FOLLOWING:  Dairy products of any kind   Cranberry juice Tomato juice / V8 juice   Grapefruit juice Orange juice     Red grape juice  Do not eat any solid foods, including such foods as: cereal, oatmeal, yogurt, fruits, vegetables, creamed soups, eggs, bread, crackers, pureed foods in a blender, etc.   HELPFUL HINTS FOR DRINKING PREP SOLUTION:   Make sure prep is extremely cold. Mix and refrigerate the the morning of the prep. You may also put in the freezer.   You may try mixing some Crystal Light or Country Time Lemonade if you prefer. Mix in small amounts; add more if necessary.  Try drinking through a straw  Rinse mouth with water or a mouthwash between glasses, to remove after-taste.  Try sipping on a cold beverage /ice/ popsicles  between glasses of prep.  Place a piece of sugar-free hard candy in mouth between glasses.  If you become nauseated, try consuming smaller amounts, or stretch out the time between glasses. Stop for 30-60 minutes, then slowly start back drinking.     OTHER INSTRUCTIONS  You will need a responsible adult at least 61 years of age to accompany you and drive you home. This person must remain in the waiting room during your procedure. The hospital will cancel your procedure if you do not have a responsible adult with you.   1. Wear loose fitting clothing that is easily removed. 2. Leave jewelry and other valuables at home.  3. Remove all body piercing jewelry and leave at home. 4. Total time from sign-in until discharge is approximately 2-3 hours. 5. You should go home directly after your procedure and rest. You can resume normal activities the day after your procedure. 6. The day of your procedure you should not:  Drive  Make legal decisions  Operate machinery  Drink alcohol  Return to work   You may call the office (Dept: [phone]) before 5:00pm, or page the doctor on call [phone]) after 5:00pm, for further instructions, if necessary.   Insurance Information YOU WILL NEED TO CHECK WITH YOUR INSURANCE COMPANY FOR THE BENEFITS OF COVERAGE YOU HAVE FOR THIS PROCEDURE.  UNFORTUNATELY, NOT ALL INSURANCE COMPANIES HAVE BENEFITS TO COVER ALL OR PART OF THESE TYPES OF PROCEDURES.  IT IS YOUR RESPONSIBILITY TO CHECK YOUR BENEFITS, HOWEVER, WE WILL BE GLAD TO ASSIST YOU WITH ANY CODES YOUR INSURANCE COMPANY MAY NEED.    PLEASE NOTE THAT MOST INSURANCE COMPANIES WILL NOT COVER A SCREENING COLONOSCOPY FOR PEOPLE UNDER THE AGE OF 50  IF YOU HAVE BCBS INSURANCE, YOU MAY HAVE BENEFITS FOR A SCREENING COLONOSCOPY BUT IF POLYPS ARE FOUND THE DIAGNOSIS WILL CHANGE AND THEN YOU MAY HAVE A DEDUCTIBLE THAT WILL NEED TO BE MET. SO PLEASE MAKE SURE YOU CHECK YOUR BENEFITS FOR A SCREENING  COLONOSCOPY AS WELL AS A DIAGNOSTIC COLONOSCOPY.

## 2019-07-30 NOTE — Progress Notes (Addendum)
Ok to schedule. Patient has a Public house manager. No defibrillator.   Day before TCS: humalog 15 units TID with meals. Humulin 30 units BID, metformin 500mg  am only.   AM of TCS: hold humalog, humulin, metformin.

## 2019-07-31 ENCOUNTER — Encounter: Payer: Self-pay | Admitting: *Deleted

## 2019-07-31 DIAGNOSIS — E1165 Type 2 diabetes mellitus with hyperglycemia: Secondary | ICD-10-CM | POA: Diagnosis not present

## 2019-07-31 DIAGNOSIS — E11319 Type 2 diabetes mellitus with unspecified diabetic retinopathy without macular edema: Secondary | ICD-10-CM | POA: Diagnosis not present

## 2019-07-31 DIAGNOSIS — I10 Essential (primary) hypertension: Secondary | ICD-10-CM | POA: Diagnosis not present

## 2019-07-31 DIAGNOSIS — E039 Hypothyroidism, unspecified: Secondary | ICD-10-CM | POA: Diagnosis not present

## 2019-07-31 DIAGNOSIS — R809 Proteinuria, unspecified: Secondary | ICD-10-CM | POA: Diagnosis not present

## 2019-07-31 NOTE — Addendum Note (Signed)
Addended by: Metro Kung on: 07/31/2019 07:42 AM   Modules accepted: Orders, SmartSet

## 2019-07-31 NOTE — Progress Notes (Signed)
Mailed letter to pt with diabetes medication adjustments.   

## 2019-08-01 DIAGNOSIS — M5033 Other cervical disc degeneration, cervicothoracic region: Secondary | ICD-10-CM | POA: Diagnosis not present

## 2019-08-01 DIAGNOSIS — M9901 Segmental and somatic dysfunction of cervical region: Secondary | ICD-10-CM | POA: Diagnosis not present

## 2019-08-06 DIAGNOSIS — M9901 Segmental and somatic dysfunction of cervical region: Secondary | ICD-10-CM | POA: Diagnosis not present

## 2019-08-06 DIAGNOSIS — M5033 Other cervical disc degeneration, cervicothoracic region: Secondary | ICD-10-CM | POA: Diagnosis not present

## 2019-08-14 DIAGNOSIS — E113313 Type 2 diabetes mellitus with moderate nonproliferative diabetic retinopathy with macular edema, bilateral: Secondary | ICD-10-CM | POA: Diagnosis not present

## 2019-08-14 DIAGNOSIS — M5033 Other cervical disc degeneration, cervicothoracic region: Secondary | ICD-10-CM | POA: Diagnosis not present

## 2019-08-14 DIAGNOSIS — H179 Unspecified corneal scar and opacity: Secondary | ICD-10-CM | POA: Diagnosis not present

## 2019-08-14 DIAGNOSIS — H2512 Age-related nuclear cataract, left eye: Secondary | ICD-10-CM | POA: Diagnosis not present

## 2019-08-14 DIAGNOSIS — H33012 Retinal detachment with single break, left eye: Secondary | ICD-10-CM | POA: Diagnosis not present

## 2019-08-14 DIAGNOSIS — M9901 Segmental and somatic dysfunction of cervical region: Secondary | ICD-10-CM | POA: Diagnosis not present

## 2019-08-22 DIAGNOSIS — M5033 Other cervical disc degeneration, cervicothoracic region: Secondary | ICD-10-CM | POA: Diagnosis not present

## 2019-08-22 DIAGNOSIS — M9901 Segmental and somatic dysfunction of cervical region: Secondary | ICD-10-CM | POA: Diagnosis not present

## 2019-08-26 DIAGNOSIS — M5033 Other cervical disc degeneration, cervicothoracic region: Secondary | ICD-10-CM | POA: Diagnosis not present

## 2019-08-26 DIAGNOSIS — M9901 Segmental and somatic dysfunction of cervical region: Secondary | ICD-10-CM | POA: Diagnosis not present

## 2019-08-30 ENCOUNTER — Encounter: Payer: Self-pay | Admitting: *Deleted

## 2019-08-30 ENCOUNTER — Telehealth: Payer: Self-pay | Admitting: Internal Medicine

## 2019-08-30 NOTE — Telephone Encounter (Signed)
Pt needs a note for Jordan Perry that she is scheduled for a covid test on 12/7 and procedure schedule for 12/9 with RMR. She said it could be faxed to the [employer] to Fax # [phone]

## 2019-08-30 NOTE — Telephone Encounter (Signed)
Called pt back to let her know that her letter was being faxed as requested.  Pt requested to pick up a copy as well.  Pt aware that I will leave it up front for her.  Pt voiced understanding.

## 2019-09-04 DIAGNOSIS — M5033 Other cervical disc degeneration, cervicothoracic region: Secondary | ICD-10-CM | POA: Diagnosis not present

## 2019-09-04 DIAGNOSIS — M9901 Segmental and somatic dysfunction of cervical region: Secondary | ICD-10-CM | POA: Diagnosis not present

## 2019-09-05 DIAGNOSIS — H33001 Unspecified retinal detachment with retinal break, right eye: Secondary | ICD-10-CM | POA: Diagnosis not present

## 2019-09-05 DIAGNOSIS — Z794 Long term (current) use of insulin: Secondary | ICD-10-CM | POA: Diagnosis not present

## 2019-09-05 DIAGNOSIS — H2701 Aphakia, right eye: Secondary | ICD-10-CM | POA: Diagnosis not present

## 2019-09-05 DIAGNOSIS — E113313 Type 2 diabetes mellitus with moderate nonproliferative diabetic retinopathy with macular edema, bilateral: Secondary | ICD-10-CM | POA: Diagnosis not present

## 2019-09-12 DIAGNOSIS — M5033 Other cervical disc degeneration, cervicothoracic region: Secondary | ICD-10-CM | POA: Diagnosis not present

## 2019-09-12 DIAGNOSIS — M9901 Segmental and somatic dysfunction of cervical region: Secondary | ICD-10-CM | POA: Diagnosis not present

## 2019-09-16 DIAGNOSIS — M5033 Other cervical disc degeneration, cervicothoracic region: Secondary | ICD-10-CM | POA: Diagnosis not present

## 2019-09-16 DIAGNOSIS — M9901 Segmental and somatic dysfunction of cervical region: Secondary | ICD-10-CM | POA: Diagnosis not present

## 2019-09-18 ENCOUNTER — Ambulatory Visit (INDEPENDENT_AMBULATORY_CARE_PROVIDER_SITE_OTHER): Payer: BC Managed Care – PPO | Admitting: *Deleted

## 2019-09-18 DIAGNOSIS — I442 Atrioventricular block, complete: Secondary | ICD-10-CM | POA: Diagnosis not present

## 2019-09-20 DIAGNOSIS — Z01818 Encounter for other preprocedural examination: Secondary | ICD-10-CM | POA: Diagnosis not present

## 2019-09-21 LAB — CUP PACEART REMOTE DEVICE CHECK
Battery Remaining Longevity: 127 mo
Battery Remaining Percentage: 95.5 %
Battery Voltage: 3.01 V
Brady Statistic AP VP Percent: 1 %
Brady Statistic AP VS Percent: 1 %
Brady Statistic AS VP Percent: 99 %
Brady Statistic AS VS Percent: 1 %
Brady Statistic RA Percent Paced: 1 %
Brady Statistic RV Percent Paced: 99 %
Date Time Interrogation Session: 20201125031117
Implantable Lead Implant Date: 20200220
Implantable Lead Implant Date: 20200220
Implantable Lead Location: 753859
Implantable Lead Location: 753860
Implantable Pulse Generator Implant Date: 20200220
Lead Channel Impedance Value: 450 Ohm
Lead Channel Impedance Value: 480 Ohm
Lead Channel Pacing Threshold Amplitude: 0.375 V
Lead Channel Pacing Threshold Amplitude: 0.5 V
Lead Channel Pacing Threshold Pulse Width: 0.5 ms
Lead Channel Pacing Threshold Pulse Width: 0.5 ms
Lead Channel Sensing Intrinsic Amplitude: 11.8 mV
Lead Channel Sensing Intrinsic Amplitude: 3.5 mV
Lead Channel Setting Pacing Amplitude: 0.75 V
Lead Channel Setting Pacing Amplitude: 1.375
Lead Channel Setting Pacing Pulse Width: 0.5 ms
Lead Channel Setting Sensing Sensitivity: 4 mV
Pulse Gen Model: 2272
Pulse Gen Serial Number: 9107223

## 2019-09-24 DIAGNOSIS — Z9889 Other specified postprocedural states: Secondary | ICD-10-CM | POA: Diagnosis not present

## 2019-09-24 DIAGNOSIS — I2510 Atherosclerotic heart disease of native coronary artery without angina pectoris: Secondary | ICD-10-CM | POA: Diagnosis not present

## 2019-09-24 DIAGNOSIS — Z7982 Long term (current) use of aspirin: Secondary | ICD-10-CM | POA: Diagnosis not present

## 2019-09-24 DIAGNOSIS — H43812 Vitreous degeneration, left eye: Secondary | ICD-10-CM | POA: Diagnosis not present

## 2019-09-24 DIAGNOSIS — Z880 Allergy status to penicillin: Secondary | ICD-10-CM | POA: Diagnosis not present

## 2019-09-24 DIAGNOSIS — M1990 Unspecified osteoarthritis, unspecified site: Secondary | ICD-10-CM | POA: Diagnosis not present

## 2019-09-24 DIAGNOSIS — K219 Gastro-esophageal reflux disease without esophagitis: Secondary | ICD-10-CM | POA: Diagnosis not present

## 2019-09-24 DIAGNOSIS — E11311 Type 2 diabetes mellitus with unspecified diabetic retinopathy with macular edema: Secondary | ICD-10-CM | POA: Diagnosis not present

## 2019-09-24 DIAGNOSIS — Z950 Presence of cardiac pacemaker: Secondary | ICD-10-CM | POA: Diagnosis not present

## 2019-09-24 DIAGNOSIS — H2701 Aphakia, right eye: Secondary | ICD-10-CM | POA: Diagnosis not present

## 2019-09-24 DIAGNOSIS — Z79899 Other long term (current) drug therapy: Secondary | ICD-10-CM | POA: Diagnosis not present

## 2019-09-24 DIAGNOSIS — Z888 Allergy status to other drugs, medicaments and biological substances status: Secondary | ICD-10-CM | POA: Diagnosis not present

## 2019-09-24 DIAGNOSIS — E785 Hyperlipidemia, unspecified: Secondary | ICD-10-CM | POA: Diagnosis not present

## 2019-09-24 DIAGNOSIS — Z794 Long term (current) use of insulin: Secondary | ICD-10-CM | POA: Diagnosis not present

## 2019-09-26 DIAGNOSIS — M9901 Segmental and somatic dysfunction of cervical region: Secondary | ICD-10-CM | POA: Diagnosis not present

## 2019-09-26 DIAGNOSIS — M5033 Other cervical disc degeneration, cervicothoracic region: Secondary | ICD-10-CM | POA: Diagnosis not present

## 2019-09-30 ENCOUNTER — Other Ambulatory Visit (HOSPITAL_COMMUNITY)
Admission: RE | Admit: 2019-09-30 | Discharge: 2019-09-30 | Disposition: A | Discharge status: Home / Self Care | Payer: BC Managed Care – PPO | Source: Ambulatory Visit | Attending: Internal Medicine | Admitting: Internal Medicine

## 2019-09-30 ENCOUNTER — Other Ambulatory Visit: Payer: Self-pay

## 2019-09-30 DIAGNOSIS — Z01812 Encounter for preprocedural laboratory examination: Secondary | ICD-10-CM | POA: Insufficient documentation

## 2019-09-30 DIAGNOSIS — M9901 Segmental and somatic dysfunction of cervical region: Secondary | ICD-10-CM | POA: Diagnosis not present

## 2019-09-30 DIAGNOSIS — Z20828 Contact with and (suspected) exposure to other viral communicable diseases: Secondary | ICD-10-CM | POA: Diagnosis not present

## 2019-09-30 DIAGNOSIS — M5033 Other cervical disc degeneration, cervicothoracic region: Secondary | ICD-10-CM | POA: Diagnosis not present

## 2019-09-30 LAB — SARS CORONAVIRUS 2 (TAT 6-24 HRS): SARS Coronavirus 2: NEGATIVE

## 2019-10-02 ENCOUNTER — Encounter (HOSPITAL_COMMUNITY): Payer: Self-pay | Admitting: *Deleted

## 2019-10-02 ENCOUNTER — Encounter (HOSPITAL_COMMUNITY)
Admission: RE | Disposition: A | Discharge status: Home / Self Care | Payer: Self-pay | Source: Home / Self Care | Attending: Internal Medicine

## 2019-10-02 ENCOUNTER — Ambulatory Visit (HOSPITAL_COMMUNITY)
Admission: RE | Admit: 2019-10-02 | Discharge: 2019-10-02 | Disposition: A | Discharge status: Home / Self Care | Payer: BC Managed Care – PPO | Attending: Internal Medicine | Admitting: Internal Medicine

## 2019-10-02 ENCOUNTER — Other Ambulatory Visit: Payer: Self-pay

## 2019-10-02 DIAGNOSIS — D124 Benign neoplasm of descending colon: Secondary | ICD-10-CM | POA: Diagnosis not present

## 2019-10-02 DIAGNOSIS — Z794 Long term (current) use of insulin: Secondary | ICD-10-CM | POA: Diagnosis not present

## 2019-10-02 DIAGNOSIS — Z1211 Encounter for screening for malignant neoplasm of colon: Secondary | ICD-10-CM | POA: Diagnosis not present

## 2019-10-02 DIAGNOSIS — K635 Polyp of colon: Secondary | ICD-10-CM | POA: Diagnosis not present

## 2019-10-02 DIAGNOSIS — D122 Benign neoplasm of ascending colon: Secondary | ICD-10-CM | POA: Diagnosis not present

## 2019-10-02 DIAGNOSIS — E119 Type 2 diabetes mellitus without complications: Secondary | ICD-10-CM | POA: Diagnosis not present

## 2019-10-02 DIAGNOSIS — M1990 Unspecified osteoarthritis, unspecified site: Secondary | ICD-10-CM | POA: Insufficient documentation

## 2019-10-02 DIAGNOSIS — D125 Benign neoplasm of sigmoid colon: Secondary | ICD-10-CM | POA: Diagnosis not present

## 2019-10-02 DIAGNOSIS — Z880 Allergy status to penicillin: Secondary | ICD-10-CM | POA: Diagnosis not present

## 2019-10-02 DIAGNOSIS — Z950 Presence of cardiac pacemaker: Secondary | ICD-10-CM | POA: Diagnosis not present

## 2019-10-02 DIAGNOSIS — Z888 Allergy status to other drugs, medicaments and biological substances status: Secondary | ICD-10-CM | POA: Diagnosis not present

## 2019-10-02 DIAGNOSIS — Z79890 Hormone replacement therapy (postmenopausal): Secondary | ICD-10-CM | POA: Diagnosis not present

## 2019-10-02 DIAGNOSIS — Z79899 Other long term (current) drug therapy: Secondary | ICD-10-CM | POA: Diagnosis not present

## 2019-10-02 DIAGNOSIS — Z7982 Long term (current) use of aspirin: Secondary | ICD-10-CM | POA: Diagnosis not present

## 2019-10-02 DIAGNOSIS — I7090 Unspecified atherosclerosis: Secondary | ICD-10-CM | POA: Diagnosis not present

## 2019-10-02 DIAGNOSIS — K640 First degree hemorrhoids: Secondary | ICD-10-CM | POA: Insufficient documentation

## 2019-10-02 DIAGNOSIS — I10 Essential (primary) hypertension: Secondary | ICD-10-CM | POA: Diagnosis not present

## 2019-10-02 DIAGNOSIS — E039 Hypothyroidism, unspecified: Secondary | ICD-10-CM | POA: Diagnosis not present

## 2019-10-02 HISTORY — PX: COLONOSCOPY: SHX5424

## 2019-10-02 HISTORY — PX: POLYPECTOMY: SHX5525

## 2019-10-02 LAB — GLUCOSE, CAPILLARY: Glucose-Capillary: 178 mg/dL — ABNORMAL HIGH (ref 70–99)

## 2019-10-02 SURGERY — COLONOSCOPY
Anesthesia: Moderate Sedation

## 2019-10-02 MED ORDER — MEPERIDINE HCL 50 MG/ML IJ SOLN
INTRAMUSCULAR | Status: AC
  Filled 2019-10-02: qty 1

## 2019-10-02 MED ORDER — SODIUM CHLORIDE 0.9 % IV SOLN
INTRAVENOUS | Status: DC
  Administered 2019-10-02: 09:00:00 via INTRAVENOUS

## 2019-10-02 MED ORDER — ONDANSETRON HCL 4 MG/2ML IJ SOLN
INTRAMUSCULAR | Status: DC | PRN
  Administered 2019-10-02: 4 mg via INTRAVENOUS

## 2019-10-02 MED ORDER — ONDANSETRON HCL 4 MG/2ML IJ SOLN
INTRAMUSCULAR | Status: AC
  Filled 2019-10-02: qty 2

## 2019-10-02 MED ORDER — MIDAZOLAM HCL 5 MG/5ML IJ SOLN
INTRAMUSCULAR | Status: AC
  Filled 2019-10-02: qty 10

## 2019-10-02 MED ORDER — MIDAZOLAM HCL 5 MG/5ML IJ SOLN
INTRAMUSCULAR | Status: DC | PRN
  Administered 2019-10-02 (×2): 1 mg via INTRAVENOUS
  Administered 2019-10-02: 2 mg via INTRAVENOUS
  Administered 2019-10-02: 1 mg via INTRAVENOUS

## 2019-10-02 MED ORDER — MEPERIDINE HCL 100 MG/ML IJ SOLN
INTRAMUSCULAR | Status: DC | PRN
  Administered 2019-10-02: 25 mg

## 2019-10-02 NOTE — Op Note (Signed)
[facility] Patient Name: Jordan Perry Procedure Date: 10/02/2019 9:09 AM MRN: [id number] Date of Birth: 06/02/1956 Attending MD: Norvel Richards , MD CSN: [id number] Age: 64 Admit Type: Outpatient Procedure:                Colonoscopy Indications:              Screening for colorectal malignant neoplasm Providers:                Norvel Richards, MD, Janeece Riggers, RN, Tammy                            Vaught, RN, Nelma Rothman, Technician Referring MD:              Medicines:                Midazolam 5 mg IV, Meperidine 25 mg IV, Ondansetron                            4 mg IV Complications:            No immediate complications. Estimated Blood Loss:     Estimated blood loss was minimal. Procedure:                Pre-Anesthesia Assessment:                           - Prior to the procedure, a History and Physical                            was performed, and patient medications and                            allergies were reviewed. The patient's tolerance of                            previous anesthesia was also reviewed. The risks                            and benefits of the procedure and the sedation                            options and risks were discussed with the patient.                            All questions were answered, and informed consent                            was obtained. Prior Anticoagulants: The patient has                            taken no previous anticoagulant or antiplatelet                            agents. ASA Grade Assessment: II - A patient with  mild systemic disease. After reviewing the risks                            and benefits, the patient was deemed in                            satisfactory condition to undergo the procedure.                           After obtaining informed consent, the colonoscope                            was passed under direct vision. Throughout the   procedure, the patient's blood pressure, pulse, and                            oxygen saturations were monitored continuously. The                            [id number] scope was introduced through                            the anus and advanced to the the cecum, identified                            by appendiceal orifice and ileocecal valve. The                            colonoscopy was performed without difficulty. The                            patient tolerated the procedure well. The quality                            of the bowel preparation was adequate. The                            ileocecal valve, appendiceal orifice, and rectum                            were photographed. Scope In: 9:26:03 AM Scope Out: 9:57:48 AM Scope Withdrawal Time: 0 hours 16 minutes 49 seconds  Total Procedure Duration: 0 hours 31 minutes 45 seconds  Findings:      The perianal and digital rectal examinations were normal.      Internal hemorrhoids were found during retroflexion. The hemorrhoids       were mild, small and Grade I (internal hemorrhoids that do not prolapse).      11 semi-pedunculated polyps were found in the sigmoid colon, descending       colon and ascending colon. Polyps were 6 to 9 mm in size       semipedunculated. These polyps were removed with a cold snare. Resection       and retrieval were complete. Estimated blood loss was minimal.      The exam was otherwise without abnormality on direct and retroflexion  views. Impression:               - Internal hemorrhoids.                           - (11) 6 to 9 mm polyps in the sigmoid colon, in                            the descending colon and in the ascending colon,                            removed with a cold snare. Resected and retrieved.                           - The examination was otherwise normal on direct                            and retroflexion views. Moderate Sedation:      Moderate (conscious)  sedation was administered by the endoscopy nurse       and supervised by the endoscopist. The following parameters were       monitored: oxygen saturation, heart rate, blood pressure, respiratory       rate, EKG, adequacy of pulmonary ventilation, and response to care.       Total physician intraservice time was 36 minutes. Recommendation:           - Patient has a contact number available for                            emergencies. The signs and symptoms of potential                            delayed complications were discussed with the                            patient. Return to normal activities tomorrow.                            Written discharge instructions were provided to the                            patient.                           - Advance diet as tolerated.                           - Continue present medications.                           - Repeat colonoscopy date to be determined after                            pending pathology results are reviewed for  surveillance.                           - Return to GI office (date not yet determined). Procedure Code(s):        --- Professional ---                           [phone], Colonoscopy, flexible; with removal of                            tumor(s), polyp(s), or other lesion(s) by snare                            technique                           99153, Moderate sedation; each additional 15                            minutes intraservice time                           G0500, Moderate sedation services provided by the                            same physician or other qualified health care                            professional performing a gastrointestinal                            endoscopic service that sedation supports,                            requiring the presence of an independent trained                            observer to assist in the monitoring of the                             patient's level of consciousness and physiological                            status; initial 15 minutes of intra-service time;                            patient age 39 years or older (additional time may                            be reported with [phone], as appropriate) Diagnosis Code(s):        --- Professional ---                           Z12.11, Encounter for screening for malignant  neoplasm of colon                           K63.5, Polyp of colon                           K64.0, First degree hemorrhoids CPT copyright 2019 American [employer] Association. All rights reserved. The codes documented in this report are preliminary and upon coder review may  be revised to meet current compliance requirements. Cristopher Estimable. Tasharra Nodine, MD Norvel Richards, MD 10/02/2019 10:08:55 AM This report has been signed electronically. Number of Addenda: 0

## 2019-10-02 NOTE — H&P (Signed)
@LOGO@   Primary Care Physician:  Golding, John, MD Primary Gastroenterologist:  Dr. Rourk  Pre-Procedure History & Physical: HPI:  Jordan Perry is a 62 y.o. female is here for a screening colonoscopy.  Presented for screening colonoscopy earlier this year but is noted to be in heart block.  Procedure canceled.  Pacemaker placed.  Doing well.  No GI symptoms.  Negative colonoscopy 10 years ago.  Past Medical History:  Diagnosis Date  . Allergic rhinitis, cause unspecified   . Arthritis   . Coronary atherosclerosis of unspecified type of vessel, native or graft   . GERD (gastroesophageal reflux disease)   . Hypothyroidism   . Proteinuria   . Pure hyperglyceridemia   . Type II or unspecified type diabetes mellitus without mention of complication, not stated as uncontrolled   . Unspecified essential hypertension     Past Surgical History:  Procedure Laterality Date  . CYSTECTOMY  1989   removed from end of spine  . PACEMAKER IMPLANT N/A 12/13/2018   Procedure: PACEMAKER IMPLANT;  Surgeon: Taylor, Gregg W, MD;  Location: [facility];  Service: Cardiovascular;  Laterality: N/A;  . REFRACTIVE SURGERY  1994  . Right lens implant      Prior to Admission medications   Medication Sig Start Date End Date Taking? Authorizing Provider  acetaminophen (TYLENOL) 500 MG tablet Take 1,000-1,500 mg by mouth as needed (for headaches.).    Yes [provider]  aspirin EC 81 MG tablet Take 81 mg by mouth daily.   Yes [provider]  atorvastatin (LIPITOR) 80 MG tablet TAKE 1 TABLET DAILY Patient taking differently: Take 80 mg by mouth at bedtime.  07/30/18  Yes Branch, Jonathan F, MD  HUMULIN N KWIKPEN 100 UNIT/ML Kiwkpen Inject 40-60 Units into the skin See admin instructions. Inject 60 units subcutaneously in the morning & 40 units subcutaneously at night. 12/20/17  Yes [provider]  insulin aspart (NOVOLOG) 100 UNIT/ML injection Inject 20-40 Units into the  skin 3 (three) times daily before meals. Sliding Scale Insulin   Yes [provider]  lisinopril (ZESTRIL) 20 MG tablet Take 1 tablet (20 mg total) by mouth daily. 05/16/19  Yes Branch, Jonathan F, MD  metFORMIN (GLUCOPHAGE-XR) 500 MG 24 hr tablet Take 500 mg by mouth 2 (two) times daily after a meal.  09/07/13  Yes [provider]  Multiple Vitamin (MULTIVITAMIN WITH MINERALS) TABS tablet Take 1 tablet by mouth daily. Centrum Silver Multivitamin   Yes [provider]  Probiotic Product (ALIGN) 4 MG CAPS Take 1 capsule by mouth daily.   Yes [provider]  SUPREP BOWEL PREP KIT 17.5-3.13-1.6 GM/177ML SOLN Take 354 mLs by mouth once. 09/12/19  Yes [provider]  SYNTHROID 125 MCG tablet Take 125 mcg by mouth daily before breakfast.  11/22/17  Yes [provider]  furosemide (LASIX) 20 MG tablet TAKE 1 TABLET AS NEEDED Patient taking differently: Take 20 mg by mouth daily as needed for fluid or edema.  07/15/15   Koneswaran, Suresh A, MD    Allergies as of 07/31/2019 - Review Complete 07/29/2019  Allergen Reaction Noted  . Invokana [canagliflozin] Other (See Comments) 08/28/2015  . Janumet [sitagliptin-metformin hcl] Other (See Comments) 08/28/2015  . Penicillins    . Prednisone Rash     Family History  Problem Relation Age of Onset  . COPD Mother 75  . Heart disease Father   . Cancer Father 70  . Coronary artery disease Other   .   Diabetes Other   . Obesity Other   . Heart attack Brother 50  . Coronary artery disease Sister   . Hyperlipidemia Other   . Hypertension Other   . Colon cancer Neg Hx     Social History   Socioeconomic History  . Marital status: Married    Spouse name: Not on file  . Number of children: 0  . Years of education: Not on file  . Highest education level: Not on file  Occupational History  . Occupation: Secretary    Employer: [employer]    Comment: [place], 13+ years  Social Needs  . Financial  resource strain: Not on file  . Food insecurity    Worry: Not on file    Inability: Not on file  . Transportation needs    Medical: Not on file    Non-medical: Not on file  Tobacco Use  . Smoking status: Never Smoker  . Smokeless tobacco: Never Used  Substance and Sexual Activity  . Alcohol use: No    Alcohol/week: 0.0 standard drinks  . Drug use: No  . Sexual activity: Not on file  Lifestyle  . Physical activity    Days per week: Not on file    Minutes per session: Not on file  . Stress: Not on file  Relationships  . Social connections    Talks on phone: Not on file    Gets together: Not on file    Attends religious service: Not on file    Active member of club or organization: Not on file    Attends meetings of clubs or organizations: Not on file    Relationship status: Not on file  . Intimate partner violence    Fear of current or ex partner: Not on file    Emotionally abused: Not on file    Physically abused: Not on file    Forced sexual activity: Not on file  Other Topics Concern  . Not on file  Social History Narrative  . Not on file    Review of Systems: See HPI, otherwise negative ROS  Physical Exam: BP (!) 129/54   Pulse 93   Resp 17   Ht 5' 4" (1.626 m)   Wt 113.4 kg   SpO2 100%   BMI 42.91 kg/m  General:   Alert,  Well-developed, well-nourished, pleasant and cooperative in NAD Lungs:  Clear throughout to auscultation.   No wheezes, crackles, or rhonchi. No acute distress. Heart:  Regular rate and rhythm; no murmurs, clicks, rubs,  or gallops. Abdomen:  Soft, nontender and nondistended. No masses, hepatosplenomegaly or hernias noted. Normal bowel sounds, without guarding, and without rebound.   Impression/Plan: Jordan Perry is now here to undergo a screening colonoscopy.  Average rescreening examination.  Risks, benefits, limitations, imponderables and alternatives regarding colonoscopy have been reviewed with the patient. Questions have been  answered. All parties agreeable.     Notice:  This dictation was prepared with Dragon dictation along with smaller phrase technology. Any transcriptional errors that result from this process are unintentional and may not be corrected upon review.  

## 2019-10-02 NOTE — Discharge Instructions (Signed)
Colonoscopy Discharge Instructions  Read the instructions outlined below and refer to this sheet in the next few weeks. These discharge instructions provide you with general information on caring for yourself after you leave the hospital. Your doctor may also give you specific instructions. While your treatment has been planned according to the most current medical practices available, unavoidable complications occasionally occur. If you have any problems or questions after discharge, call Dr. Gala Romney at [phone]. ACTIVITY  You may resume your regular activity, but move at a slower pace for the next 24 hours.   Take frequent rest periods for the next 24 hours.   Walking will help get rid of the air and reduce the bloated feeling in your belly (abdomen).   No driving for 24 hours (because of the medicine (anesthesia) used during the test).    Do not sign any important legal documents or operate any machinery for 24 hours (because of the anesthesia used during the test).  NUTRITION  Drink plenty of fluids.   You may resume your normal diet as instructed by your doctor.   Begin with a light meal and progress to your normal diet. Heavy or fried foods are harder to digest and may make you feel sick to your stomach (nauseated).   Avoid alcoholic beverages for 24 hours or as instructed.  MEDICATIONS  You may resume your normal medications unless your doctor tells you otherwise.  WHAT YOU CAN EXPECT TODAY  Some feelings of bloating in the abdomen.   Passage of more gas than usual.   Spotting of blood in your stool or on the toilet paper.  IF YOU HAD POLYPS REMOVED DURING THE COLONOSCOPY:  No aspirin products for 7 days or as instructed.   No alcohol for 7 days or as instructed.   Eat a soft diet for the next 24 hours.  FINDING OUT THE RESULTS OF YOUR TEST Not all test results are available during your visit. If your test results are not back during the visit, make an appointment  with your caregiver to find out the results. Do not assume everything is normal if you have not heard from your caregiver or the medical facility. It is important for you to follow up on all of your test results.  SEEK IMMEDIATE MEDICAL ATTENTION IF:  You have more than a spotting of blood in your stool.   Your belly is swollen (abdominal distention).   You are nauseated or vomiting.   You have a temperature over 101.   You have abdominal pain or discomfort that is severe or gets worse throughout the day.   Polyp information provided  11 polyps removed from your colon today.  You may pass a slight amount of blood with your next bowel movement but it will go away  Further recommendations to follow pending review of pathology report  At patient request, I called her husband, Francee Piccolo, at [phone] -got message "all circuits busy"  PATIENT INSTRUCTIONS POST-ANESTHESIA  IMMEDIATELY FOLLOWING SURGERY:  Do not drive or operate machinery for the first twenty four hours after surgery.  Do not make any important decisions for twenty four hours after surgery or while taking narcotic pain medications or sedatives.  If you develop intractable nausea and vomiting or a severe headache please notify your doctor immediately.  FOLLOW-UP:  Please make an appointment with your surgeon as instructed. You do not need to follow up with anesthesia unless specifically instructed to do so.  WOUND CARE INSTRUCTIONS (if applicable):  Keep a dry clean dressing on the anesthesia/puncture wound site if there is drainage.  Once the wound has quit draining you may leave it open to air.  Generally you should leave the bandage intact for twenty four hours unless there is drainage.  If the epidural site drains for more than 36-48 hours please call the anesthesia department.  QUESTIONS?:  Please feel free to call your physician or the hospital operator if you have any questions, and they will be happy to assist you.        Colon Polyps  Polyps are tissue growths inside the body. Polyps can grow in many places, including the large intestine (colon). A polyp may be a round bump or a mushroom-shaped growth. You could have one polyp or several. Most colon polyps are noncancerous (benign). However, some colon polyps can become cancerous over time. Finding and removing the polyps early can help prevent this. What are the causes? The exact cause of colon polyps is not known. What increases the risk? You are more likely to develop this condition if you:  Have a family history of colon cancer or colon polyps.  Are older than 24 or older than 45 if you are African American.  Have inflammatory bowel disease, such as ulcerative colitis or Crohn's disease.  Have certain hereditary conditions, such as: ? Familial adenomatous polyposis. ? Lynch syndrome. ? Turcot syndrome. ? Peutz-Jeghers syndrome.  Are overweight.  Smoke cigarettes.  Do not get enough exercise.  Drink too much alcohol.  Eat a diet that is high in fat and red meat and low in fiber.  Had childhood cancer that was treated with abdominal radiation. What are the signs or symptoms? Most polyps do not cause symptoms. If you have symptoms, they may include:  Blood coming from your rectum when having a bowel movement.  Blood in your stool. The stool may look dark red or black.  Abdominal pain.  A change in bowel habits, such as constipation or diarrhea. How is this diagnosed? This condition is diagnosed with a colonoscopy. This is a procedure in which a lighted, flexible scope is inserted into the anus and then passed into the colon to examine the area. Polyps are sometimes found when a colonoscopy is done as part of routine cancer screening tests. How is this treated? Treatment for this condition involves removing any polyps that are found. Most polyps can be removed during a colonoscopy. Those polyps will then be tested for cancer.  Additional treatment may be needed depending on the results of testing. Follow these instructions at home: Lifestyle  Maintain a healthy weight, or lose weight if recommended by your health care provider.  Exercise every day or as told by your health care provider.  Do not use any products that contain nicotine or tobacco, such as cigarettes and e-cigarettes. If you need help quitting, ask your health care provider.  If you drink alcohol, limit how much you have: ? 0-1 drink a day for women. ? 0-2 drinks a day for men.  Be aware of how much alcohol is in your drink. In the [place] one drink equals one 12 oz bottle of beer (355 mL), one 5 oz glass of wine (148 mL), or one 1 oz shot of hard liquor (44 mL). Eating and drinking   Eat foods that are high in fiber, such as fruits, vegetables, and whole grains.  Eat foods that are high in calcium and vitamin D, such as milk, cheese, yogurt, eggs, liver,  fish, and broccoli.  Limit foods that are high in fat, such as fried foods and desserts.  Limit the amount of red meat and processed meat you eat, such as hot dogs, sausage, bacon, and lunch meats. General instructions  Keep all follow-up visits as told by your health care provider. This is important. ? This includes having regularly scheduled colonoscopies. ? Talk to your health care provider about when you need a colonoscopy. Contact a health care provider if:  You have new or worsening bleeding during a bowel movement.  You have new or increased blood in your stool.  You have a change in bowel habits.  You lose weight for no known reason. Summary  Polyps are tissue growths inside the body. Polyps can grow in many places, including the colon.  Most colon polyps are noncancerous (benign), but some can become cancerous over time.  This condition is diagnosed with a colonoscopy.  Treatment for this condition involves removing any polyps that are found. Most polyps can be removed  during a colonoscopy. This information is not intended to replace advice given to you by your health care provider. Make sure you discuss any questions you have with your health care provider. Document Released: 07/06/2004 Document Revised: 01/25/2018 Document Reviewed: 01/25/2018 Elsevier Patient Education  2020 [employer].

## 2019-10-03 ENCOUNTER — Encounter: Payer: Self-pay | Admitting: Internal Medicine

## 2019-10-03 LAB — SURGICAL PATHOLOGY

## 2019-10-10 DIAGNOSIS — M5033 Other cervical disc degeneration, cervicothoracic region: Secondary | ICD-10-CM | POA: Diagnosis not present

## 2019-10-10 DIAGNOSIS — M9901 Segmental and somatic dysfunction of cervical region: Secondary | ICD-10-CM | POA: Diagnosis not present

## 2019-10-14 DIAGNOSIS — M5033 Other cervical disc degeneration, cervicothoracic region: Secondary | ICD-10-CM | POA: Diagnosis not present

## 2019-10-14 DIAGNOSIS — M9901 Segmental and somatic dysfunction of cervical region: Secondary | ICD-10-CM | POA: Diagnosis not present

## 2019-10-16 DIAGNOSIS — H179 Unspecified corneal scar and opacity: Secondary | ICD-10-CM | POA: Diagnosis not present

## 2019-10-16 DIAGNOSIS — H2512 Age-related nuclear cataract, left eye: Secondary | ICD-10-CM | POA: Diagnosis not present

## 2019-10-16 DIAGNOSIS — E113313 Type 2 diabetes mellitus with moderate nonproliferative diabetic retinopathy with macular edema, bilateral: Secondary | ICD-10-CM | POA: Diagnosis not present

## 2019-10-16 DIAGNOSIS — H33012 Retinal detachment with single break, left eye: Secondary | ICD-10-CM | POA: Diagnosis not present

## 2019-10-23 ENCOUNTER — Other Ambulatory Visit: Payer: Self-pay | Admitting: Cardiology

## 2019-10-24 DIAGNOSIS — M5033 Other cervical disc degeneration, cervicothoracic region: Secondary | ICD-10-CM | POA: Diagnosis not present

## 2019-10-24 DIAGNOSIS — M9901 Segmental and somatic dysfunction of cervical region: Secondary | ICD-10-CM | POA: Diagnosis not present

## 2019-11-07 DIAGNOSIS — M5033 Other cervical disc degeneration, cervicothoracic region: Secondary | ICD-10-CM | POA: Diagnosis not present

## 2019-11-07 DIAGNOSIS — M9901 Segmental and somatic dysfunction of cervical region: Secondary | ICD-10-CM | POA: Diagnosis not present

## 2019-11-11 ENCOUNTER — Ambulatory Visit: Payer: BC Managed Care – PPO | Admitting: Orthopedic Surgery

## 2019-11-14 DIAGNOSIS — M9901 Segmental and somatic dysfunction of cervical region: Secondary | ICD-10-CM | POA: Diagnosis not present

## 2019-11-14 DIAGNOSIS — M5033 Other cervical disc degeneration, cervicothoracic region: Secondary | ICD-10-CM | POA: Diagnosis not present

## 2019-11-18 DIAGNOSIS — Z961 Presence of intraocular lens: Secondary | ICD-10-CM | POA: Diagnosis not present

## 2019-11-20 ENCOUNTER — Ambulatory Visit: Payer: BC Managed Care – PPO

## 2019-11-20 ENCOUNTER — Other Ambulatory Visit: Payer: Self-pay

## 2019-11-20 ENCOUNTER — Encounter: Payer: Self-pay | Admitting: Orthopedic Surgery

## 2019-11-20 ENCOUNTER — Ambulatory Visit: Payer: BC Managed Care – PPO | Admitting: Orthopedic Surgery

## 2019-11-20 VITALS — Ht 64.0 in | Wt 255.0 lb

## 2019-11-20 DIAGNOSIS — Z6841 Body Mass Index (BMI) 40.0 and over, adult: Secondary | ICD-10-CM | POA: Diagnosis not present

## 2019-11-20 DIAGNOSIS — G8929 Other chronic pain: Secondary | ICD-10-CM

## 2019-11-20 DIAGNOSIS — M25512 Pain in left shoulder: Secondary | ICD-10-CM | POA: Diagnosis not present

## 2019-11-20 DIAGNOSIS — M7542 Impingement syndrome of left shoulder: Secondary | ICD-10-CM | POA: Diagnosis not present

## 2019-11-20 DIAGNOSIS — M542 Cervicalgia: Secondary | ICD-10-CM | POA: Diagnosis not present

## 2019-11-20 MED ORDER — MELOXICAM 7.5 MG PO TABS: 7.5000 mg | ORAL_TABLET | Freq: Every day | ORAL | 5 refills | Status: DC

## 2019-11-20 NOTE — Progress Notes (Addendum)
Jordan Perry  11/20/2019  Body mass index is 43.77 kg/m.   HISTORY SECTION :  Chief Complaint  Patient presents with  . Shoulder Pain    left for a couple months no injury   . Neck Pain    left arm pain into neck    64 year old female with coronary artery disease these requiring a pacemaker diabetic, hypothyroidism, BMI 43 presents with 42-month history of atraumatic onset of painful left shoulder with painful range of motion associated with some neck pain for which she has seen a chiropractor  She describes pain that starts in her cervical spine runs down across the shoulder upper arm forearm and into the left hand  She has not taken any medication other than Tylenol    Review of Systems  Musculoskeletal: Positive for back pain, joint pain, myalgias and neck pain.  Endo/Heme/Allergies: Positive for environmental allergies.     has a past medical history of Allergic rhinitis, cause unspecified, Arthritis, Coronary atherosclerosis of unspecified type of vessel, native or graft, GERD (gastroesophageal reflux disease), Hypothyroidism, Proteinuria, Pure hyperglyceridemia, Type II or unspecified type diabetes mellitus without mention of complication, not stated as uncontrolled, and Unspecified essential hypertension.   Past Surgical History:  Procedure Laterality Date  . COLONOSCOPY N/A 10/02/2019   Procedure: COLONOSCOPY;  Surgeon: Daneil Dolin, MD;  [facility];  Service: Endoscopy;  Laterality: N/A;  9:30  . CYSTECTOMY  1989   removed from end of spine  . PACEMAKER IMPLANT N/A 12/13/2018   Procedure: PACEMAKER IMPLANT;  Surgeon: Evans Lance, MD;  Location: [facility];  Service: Cardiovascular;  Laterality: N/A;  . POLYPECTOMY  10/02/2019   Procedure: POLYPECTOMY;  Surgeon: Daneil Dolin, MD;  [facility];  Service: Endoscopy;;  . Door  . Right lens implant      Body mass index is 43.77 kg/m.   Allergies   Allergen Reactions  . Invokana [Canagliflozin] Other (See Comments)    Skin peeling  . Janumet [Sitagliptin-Metformin Hcl] Other (See Comments)    Skin peeling  . Cephalexin Other (See Comments)  . Penicillins     Has patient had a PCN reaction causing immediate rash, facial/tongue/throat swelling, SOB or lightheadedness with hypotension: Yes-immediate rash Has patient had a PCN reaction causing severe rash involving mucus membranes or skin necrosis: Yes Has patient had a PCN reaction that required hospitalization: No Has patient had a PCN reaction occurring within the last 10 years: No If all of the above answers are "NO", then may proceed with Cephalosporin use.   . Prednisone Rash     Current Outpatient Medications:  .  aspirin EC 81 MG tablet, Take 81 mg by mouth daily., Disp: , Rfl:  .  atorvastatin (LIPITOR) 80 MG tablet, Take 1 tablet (80 mg total) by mouth at bedtime., Disp: 90 tablet, Rfl: 3 .  furosemide (LASIX) 20 MG tablet, TAKE 1 TABLET AS NEEDED (Patient taking differently: Take 20 mg by mouth daily as needed for fluid or edema. ), Disp: 90 tablet, Rfl: 1 .  HUMULIN N KWIKPEN 100 UNIT/ML Kiwkpen, Inject 40-60 Units into the skin See admin instructions. Inject 60 units subcutaneously in the morning & 40 units subcutaneously at night., Disp: , Rfl:  .  insulin lispro (HUMALOG) 100 UNIT/ML injection, Inject 10-40 Units into the skin 3 (three) times daily before meals., Disp: , Rfl:  .  lisinopril (ZESTRIL) 20 MG tablet, Take 1 tablet (20 mg total)  by mouth daily., Disp: 90 tablet, Rfl: 3 .  metFORMIN (GLUCOPHAGE-XR) 500 MG 24 hr tablet, Take 500 mg by mouth 2 (two) times daily after a meal. , Disp: , Rfl:  .  SYNTHROID 125 MCG tablet, Take 125 mcg by mouth daily before breakfast. , Disp: , Rfl:  .  acetaminophen (TYLENOL) 500 MG tablet, Take 1,000-1,500 mg by mouth as needed (for headaches.). , Disp: , Rfl:  .  insulin aspart (NOVOLOG) 100 UNIT/ML injection, Inject 20-40 Units  into the skin 3 (three) times daily before meals. Sliding Scale Insulin, Disp: , Rfl:  .  meloxicam (MOBIC) 7.5 MG tablet, Take 1 tablet (7.5 mg total) by mouth daily., Disp: 30 tablet, Rfl: 5 .  Multiple Vitamin (MULTIVITAMIN WITH MINERALS) TABS tablet, Take 1 tablet by mouth daily. Centrum Silver Multivitamin, Disp: , Rfl:  .  Probiotic Product (ALIGN) 4 MG CAPS, Take 1 capsule by mouth daily., Disp: , Rfl:    PHYSICAL EXAM SECTION: 1) Ht 5\' 4"  (1.626 m)   Wt 255 lb (115.7 kg)   BMI 43.77 kg/m   Body mass index is 43.77 kg/m.   The patient meets the AMA guidelines for Morbid (severe) obesity with a BMI > 40.0 and I have recommended weight loss.  General appearance: Well-developed well-nourished no gross deformities  2) Cardiovascular normal pulse and perfusion , normal color upper extremities right and left  3) Neurologically deep tendon reflexes are equal and normal, no sensation loss or deficits no pathologic reflexes upper extremities right and left  4) Psychological: Awake alert and oriented x3 mood and affect normal  5) Skin no lacerations or ulcerations no nodularity no palpable masses, no erythema or nodularity  6) Musculoskeletal:   C-spine tenderness increased lordosis at the cervical thoracic junction.  Decreased range of motion tenderness in the left trap  Right shoulder no tenderness normal range of motion shoulder stable muscle tone and strength normal  Left shoulder tenderness in the lateral deltoid with painful range of motion actively at 120 degrees of flexion this is increases with passive motion up to 150 degrees she has a positive impingement cuff is intact shoulder stable   MEDICAL DECISION MAKING  A.  Encounter Diagnoses  Name Primary?  . Cervicalgia   . Chronic left shoulder pain   . Impingement syndrome of left shoulder Yes  . Body mass index 40.0-44.9, adult ([place])   . Morbid obesity ([place])     B. DATA ANALYSED:  IMAGING: Independent  interpretation of images: Cervical MRI dated 2016, small disc at [facility] with focal left paracentral herniation mild spinal stenosis mild right-sided spinal stenosis and C6-7 broad disc protrusion with paracentral and posterior lateral left sided disc   X-rays in the office cervical spine cervical spondylosis C4-6 see report  AP lateral left shoulder suboptimal films would recommend repeating of surgery was required due to pacemaker obscuring bony detail no glenohumeral arthritis  See report   C. MANAGEMENT   The patient is diabetic has obesity hypothyroidism has a pacemaker would recommend that she undergo a good course of nonoperative treatment.  Recommend subacromial injection  Physical therapy  Continue with chiropractic care for neck problems which have become more symptomatic would recommend that she a neurosurgery for back  We will see her in 3 months to check on her left shoulder again.  Pain she can take Tylenol 500 mg every 6 and meloxicam 1 7.5 mg daily and use topical medications and heat or ice as comfortable  Meds ordered this encounter  Medications  . meloxicam (MOBIC) 7.5 MG tablet    Sig: Take 1 tablet (7.5 mg total) by mouth daily.    Dispense:  30 tablet    Refill:  5    Procedure note the subacromial injection shoulder left   Verbal consent was obtained to inject the  Left   Shoulder  Timeout was completed to confirm the injection site is a subacromial space of the  left  shoulder  Medication used Depo-Medrol 40 mg and lidocaine 1% 3 cc  Anesthesia was provided by ethyl chloride  The injection was performed in the left  posterior subacromial space. After pinning the skin with alcohol and anesthetized the skin with ethyl chloride the subacromial space was injected using a 20-gauge needle. There were no complications  Sterile dressing was applied.           Arther Abbott, MD  11/20/2019 9:21 AM

## 2019-11-20 NOTE — Patient Instructions (Addendum)
Jordan Perry we are recommending nonoperative treatment because of your heart disease thyroid disease diabetes.  We would also recommend her government guidelines weight loss as appropriate  For pain you can take Tylenol 5 mg every 6 hours and we have prescribed meloxicam 7.5 mg daily  You should undergo physical therapy for the left shoulder  Continue chiropractic treatment for your cervical spine  You can also use some of the topical medications noted below  These are the muscle and arthrits creams I recommend:  PLEASE READ THE PACKAGE INSTRUCTIONS BEFORE USING   Jordan Perry arthritis cream  Icy hot vanishing gel  Aspercreme odor free  Myoflex Oderless pain reliever  Capzasin  Sportscreme  Max freeze   You have received an injection of steroids into the joint. 15% of patients will have increased pain within the 24 hours postinjection.   This is transient and will go away.   We recommend that you use ice packs on the injection site for 20 minutes every 2 hours and extra strength Tylenol 2 tablets every 8 as needed until the pain resolves.  If you continue to have pain after taking the Tylenol and using the ice please call the office for further instructions.   Shoulder Impingement Syndrome  Shoulder impingement syndrome is a condition that causes pain when connective tissues (tendons) surrounding the shoulder joint become pinched. These tendons are part of the group of muscles and tissues that help to stabilize the shoulder (rotator cuff). Beneath the rotator cuff is a fluid-filled sac (bursa) that allows the muscles and tendons to glide smoothly. The bursa may become swollen or irritated (bursitis). Bursitis, swelling in the rotator cuff tendons, or both conditions can decrease how much space is under a bone in the shoulder joint (acromion), resulting in impingement. What are the causes? Shoulder impingement syndrome may be caused by bursitis or swelling of the rotator cuff  tendons, which may result from:  Repetitive overhead arm movements.  Falling onto the shoulder.  Weakness in the shoulder muscles. What increases the risk? You may be more likely to develop this condition if you:  Play sports that involve throwing, such as baseball.  Participate in sports such as tennis, volleyball, and swimming.  Work as a Curator, Games developer, or Architect. Some people are also more likely to develop impingement syndrome because of the shape of their acromion bone. What are the signs or symptoms? The main symptom of this condition is pain on the front or side of the shoulder. The pain may:  Get worse when lifting or raising the arm.  Get worse at night.  Wake you up from sleeping.  Feel sharp when the shoulder is moved and then fade to an ache. Other symptoms may include:  Tenderness.  Stiffness.  Inability to raise the arm above shoulder level or behind the body.  Weakness. How is this diagnosed? This condition may be diagnosed based on:  Your symptoms and medical history.  A physical exam.  Imaging tests, such as: ? X-rays. ? MRI. ? Ultrasound. How is this treated? This condition may be treated by:  Resting your shoulder and avoiding all activities that cause pain or put stress on the shoulder.  Icing your shoulder.  NSAIDs to help reduce pain and swelling.  One or more injections of medicines to numb the area and reduce inflammation.  Physical therapy.  Surgery. This may be needed if nonsurgical treatments have not helped. Surgery may involve repairing the rotator cuff, reshaping the acromion,  or removing the bursa. Follow these instructions at home: Managing pain, stiffness, and swelling   If directed, put ice on the injured area. ? Put ice in a plastic bag. ? Place a towel between your skin and the bag. ? Leave the ice on for 20 minutes, 2-3 times a day. Activity  Rest and return to your normal activities as told by your  health care provider. Ask your health care provider what activities are safe for you.  Do exercises as told by your health care provider. General instructions  Do not use any products that contain nicotine or tobacco, such as cigarettes, e-cigarettes, and chewing tobacco. These can delay healing. If you need help quitting, ask your health care provider.  Ask your health care provider when it is safe for you to drive.  Take over-the-counter and prescription medicines only as told by your health care provider.  Keep all follow-up visits as told by your health care provider. This is important. How is this prevented?  Give your body time to rest between periods of activity.  Be safe and responsible while being active. This will help you avoid falls.  Maintain physical fitness, including strength and flexibility. Contact a health care provider if:  Your symptoms have not improved after 1-2 months of treatment and rest.  You cannot lift your arm away from your body. Summary  Shoulder impingement syndrome is a condition that causes pain when connective tissues (tendons) surrounding the shoulder joint become pinched.  The main symptom of this condition is pain on the front or side of the shoulder.  This condition is usually treated with rest, ice, and pain medicines as needed. This information is not intended to replace advice given to you by your health care provider. Make sure you discuss any questions you have with your health care provider. Document Revised: 02/01/2019 Document Reviewed: 04/04/2018 Elsevier Patient Education  2020 [employer].

## 2019-11-21 ENCOUNTER — Ambulatory Visit: Payer: BC Managed Care – PPO | Admitting: Cardiology

## 2019-11-21 DIAGNOSIS — M9901 Segmental and somatic dysfunction of cervical region: Secondary | ICD-10-CM | POA: Diagnosis not present

## 2019-11-21 DIAGNOSIS — M5033 Other cervical disc degeneration, cervicothoracic region: Secondary | ICD-10-CM | POA: Diagnosis not present

## 2019-11-22 ENCOUNTER — Other Ambulatory Visit: Payer: Self-pay

## 2019-11-22 ENCOUNTER — Encounter: Payer: Self-pay | Admitting: *Deleted

## 2019-11-22 ENCOUNTER — Encounter: Payer: Self-pay | Admitting: Cardiology

## 2019-11-22 ENCOUNTER — Ambulatory Visit: Payer: BC Managed Care – PPO | Admitting: Cardiology

## 2019-11-22 VITALS — BP 122/79 | HR 64 | Ht 64.0 in | Wt 259.6 lb

## 2019-11-22 DIAGNOSIS — I442 Atrioventricular block, complete: Secondary | ICD-10-CM | POA: Diagnosis not present

## 2019-11-22 DIAGNOSIS — I10 Essential (primary) hypertension: Secondary | ICD-10-CM

## 2019-11-22 DIAGNOSIS — E782 Mixed hyperlipidemia: Secondary | ICD-10-CM

## 2019-11-22 DIAGNOSIS — Z8249 Family history of ischemic heart disease and other diseases of the circulatory system: Secondary | ICD-10-CM

## 2019-11-22 NOTE — Progress Notes (Signed)
Clinical Summary Jordan Perry is a 64 y.o.female seen today for follow up of the following medical problems.  1. Family history of CAD - normal stress myoview in 2009 - remote history of cath when seeing Dr Verl Blalock, no significant CAD  -no recent chest pain.   2. HTN - she is compliant with meds  3. Hyperlipidemia - labs followed by pcp, compliant with statin  4. DM2 - followed by pcp  5. LE edema  takes prn lasix pretty rarely, has done well  6. Complete heart block - pacemaker followed by EP, placed 11/2018 - normal device function, 14 secs of afib  - energy has improved with pacemaker placement - no recent symptoms    SH: sister Jordan Perry who was pervious patient of mine who passed away 11-28-2015. Her husband Jordan Perry is also a patient of mine.  - works as a Financial controller at [employer] - recent trip to [place] for the race  SH: recent trip to [place]   Past Medical History:  Diagnosis Date  . Allergic rhinitis, cause unspecified   . Arthritis   . Coronary atherosclerosis of unspecified type of vessel, native or graft   . GERD (gastroesophageal reflux disease)   . Hypothyroidism   . Proteinuria   . Pure hyperglyceridemia   . Type II or unspecified type diabetes mellitus without mention of complication, not stated as uncontrolled   . Unspecified essential hypertension      Allergies  Allergen Reactions  . Invokana [Canagliflozin] Other (See Comments)    Skin peeling  . Janumet [Sitagliptin-Metformin Hcl] Other (See Comments)    Skin peeling  . Cephalexin Other (See Comments)  . Penicillins     Has patient had a PCN reaction causing immediate rash, facial/tongue/throat swelling, SOB or lightheadedness with hypotension: Yes-immediate rash Has patient had a PCN reaction causing severe rash involving mucus membranes or skin necrosis: Yes Has patient had a PCN reaction that required hospitalization: No Has patient had a PCN reaction  occurring within the last 10 years: No If all of the above answers are "NO", then may proceed with Cephalosporin use.   . Prednisone Rash     Current Outpatient Medications  Medication Sig Dispense Refill  . acetaminophen (TYLENOL) 500 MG tablet Take 1,000-1,500 mg by mouth as needed (for headaches.).     Marland Kitchen aspirin EC 81 MG tablet Take 81 mg by mouth daily.    Marland Kitchen atorvastatin (LIPITOR) 80 MG tablet Take 1 tablet (80 mg total) by mouth at bedtime. 90 tablet 3  . furosemide (LASIX) 20 MG tablet TAKE 1 TABLET AS NEEDED (Patient taking differently: Take 20 mg by mouth daily as needed for fluid or edema. ) 90 tablet 1  . HUMULIN N KWIKPEN 100 UNIT/ML Kiwkpen Inject 40-60 Units into the skin See admin instructions. Inject 60 units subcutaneously in the morning & 40 units subcutaneously at night.    . insulin aspart (NOVOLOG) 100 UNIT/ML injection Inject 20-40 Units into the skin 3 (three) times daily before meals. Sliding Scale Insulin    . insulin lispro (HUMALOG) 100 UNIT/ML injection Inject 10-40 Units into the skin 3 (three) times daily before meals.    Marland Kitchen lisinopril (ZESTRIL) 20 MG tablet Take 1 tablet (20 mg total) by mouth daily. 90 tablet 3  . meloxicam (MOBIC) 7.5 MG tablet Take 1 tablet (7.5 mg total) by mouth daily. 30 tablet 5  . metFORMIN (GLUCOPHAGE-XR) 500 MG 24 hr tablet Take 500 mg  by mouth 2 (two) times daily after a meal.     . Multiple Vitamin (MULTIVITAMIN WITH MINERALS) TABS tablet Take 1 tablet by mouth daily. Centrum Silver Multivitamin    . Probiotic Product (ALIGN) 4 MG CAPS Take 1 capsule by mouth daily.    Marland Kitchen SYNTHROID 125 MCG tablet Take 125 mcg by mouth daily before breakfast.      No current facility-administered medications for this visit.     Past Surgical History:  Procedure Laterality Date  . COLONOSCOPY N/A 10/02/2019   Procedure: COLONOSCOPY;  Surgeon: Daneil Dolin, MD;  [facility];  Service: Endoscopy;  Laterality: N/A;  9:30  . CYSTECTOMY   1989   removed from end of spine  . PACEMAKER IMPLANT N/A 12/13/2018   Procedure: PACEMAKER IMPLANT;  Surgeon: Evans Lance, MD;  Location: [place] [facility];  Service: Cardiovascular;  Laterality: N/A;  . POLYPECTOMY  10/02/2019   Procedure: POLYPECTOMY;  Surgeon: Daneil Dolin, MD;  [facility];  Service: Endoscopy;;  . [place]  . Right lens implant       Allergies  Allergen Reactions  . Invokana [Canagliflozin] Other (See Comments)    Skin peeling  . Janumet [Sitagliptin-Metformin Hcl] Other (See Comments)    Skin peeling  . Cephalexin Other (See Comments)  . Penicillins     Has patient had a PCN reaction causing immediate rash, facial/tongue/throat swelling, SOB or lightheadedness with hypotension: Yes-immediate rash Has patient had a PCN reaction causing severe rash involving mucus membranes or skin necrosis: Yes Has patient had a PCN reaction that required hospitalization: No Has patient had a PCN reaction occurring within the last 10 years: No If all of the above answers are "NO", then may proceed with Cephalosporin use.   . Prednisone Rash      Family History  Problem Relation Age of Onset  . COPD Mother 58  . Heart disease Father   . Cancer Father 27  . Coronary artery disease Other   . Diabetes Other   . Obesity Other   . Heart attack Brother 32  . Coronary artery disease Sister   . Hyperlipidemia Other   . Hypertension Other   . Colon cancer Neg Hx      Social History [name] reports that she has never smoked. She has never used smokeless tobacco. [name] reports no history of alcohol use.   Review of Systems CONSTITUTIONAL: No weight loss, fever, chills, weakness or fatigue.  HEENT: Eyes: No visual loss, blurred vision, double vision or yellow sclerae.No hearing loss, sneezing, congestion, runny nose or sore throat.  SKIN: No rash or itching.  CARDIOVASCULAR: per hpi RESPIRATORY: No shortness of breath, cough or  sputum.  GASTROINTESTINAL: No anorexia, nausea, vomiting or diarrhea. No abdominal pain or blood.  GENITOURINARY: No burning on urination, no polyuria NEUROLOGICAL: No headache, dizziness, syncope, paralysis, ataxia, numbness or tingling in the extremities. No change in bowel or bladder control.  MUSCULOSKELETAL: No muscle, back pain, joint pain or stiffness.  LYMPHATICS: No enlarged nodes. No history of splenectomy.  PSYCHIATRIC: No history of depression or anxiety.  ENDOCRINOLOGIC: No reports of sweating, cold or heat intolerance. No polyuria or polydipsia.  Marland Kitchen   Physical Examination Today's Vitals   11/22/19 0816  BP: 122/79  Pulse: 64  SpO2: 97%  Weight: 259 lb 9.6 oz (117.8 kg)  Height: 5\' 4"  (1.626 m)   Body mass index is 44.56 kg/m.  Gen: resting  comfortably, no acute distress HEENT: no scleral icterus, pupils equal round and reactive, no palptable cervical adenopathy,  CV: RRR, no m/r/g, no jvd Resp: Clear to auscultation bilaterally GI: abdomen is soft, non-tender, non-distended, normal bowel sounds, no hepatosplenomegaly MSK: extremities are warm, no edema.  Skin: warm, no rash Neuro:  no focal deficits Psych: appropriate affect   Diagnostic Studies  11/2018 echo 1. The left ventricle has normal systolic function with an ejection fraction of 60-65%. The cavity size was normal. Left ventricular diastolic Doppler parameters are indeterminate.  2. The mitral valve is normal in structure. Mild thickening of the mitral valve leaflet. No evidence of mitral valve stenosis.  3. The tricuspid valve is normal in structure.  4. The aortic valve is tricuspid Mild thickening of the aortic valve no stenosis of the aortic valve.  5. The aortic root is normal in size and structure.  6. Pulmonary hypertension is mildly elevated, PASP is 38 mmHg.  7. The inferior vena cava was dilated in size with >50% respiratory variability.  8. The interatrial septum was not well visualized.  9.  RV poorly visualized. Grossly appears enlarged with normal function   Assessment and Plan  1. Family history of CAD - strong family history of CAD. She has no known history, and has had negative cath and prior stress testing -no recent symptoms, continue to monitor.  - EKG with lateral precordial TWIs asymptomatic. Normal stress test just over 1 year ago, monitor at this time.    2. HTN =- she is at goal, continue current meds  3. Hyperlipidemia - request labs from pcp, conitnue statin  4. Lower extremity edema - continue prn lasix  5. Complete heart block - s/p pacemaker placement, no recent symptoms. Normal device check recently, has repeat next month   F/u 1 year      Arnoldo Lenis, M.D.,

## 2019-11-22 NOTE — Patient Instructions (Signed)

## 2019-11-25 ENCOUNTER — Other Ambulatory Visit: Payer: Self-pay

## 2019-11-25 ENCOUNTER — Ambulatory Visit (HOSPITAL_COMMUNITY): Payer: BC Managed Care – PPO | Attending: Orthopedic Surgery

## 2019-11-25 ENCOUNTER — Encounter (HOSPITAL_COMMUNITY): Payer: Self-pay

## 2019-11-25 DIAGNOSIS — M25612 Stiffness of left shoulder, not elsewhere classified: Secondary | ICD-10-CM

## 2019-11-25 DIAGNOSIS — R29898 Other symptoms and signs involving the musculoskeletal system: Secondary | ICD-10-CM | POA: Diagnosis not present

## 2019-11-25 DIAGNOSIS — M25512 Pain in left shoulder: Secondary | ICD-10-CM

## 2019-11-25 NOTE — Patient Instructions (Signed)
Complete each exercise 10-15 repetitions. 2-3 times a day.    SHOULDER: Flexion On Table   Place hands on towel placed on table, elbows straight. Lean forward with you upper body, pushing towel away from body.  ___ reps per set, ___ sets per day  Abduction (Passive)   With arm out to side, resting on towel placed on table with palm DOWN, keeping trunk away from table, lean to the side while pushing towel away from body.  Repeat ____ times. Do ____ sessions per day.      Internal Rotation (Assistive)   Seated with elbow bent at right angle and held against side, slide arm on table surface in an inward arc keeping elbow anchored in place. Repeat ____ times. Do ____ sessions per day. Activity: Use this motion to brush crumbs off the table.

## 2019-11-25 NOTE — Therapy (Signed)
[facility], [place], [address] Phone: [phone]   Fax:  [phone]  Occupational Therapy Evaluation  Patient Details  Name: Jordan Perry MRN: [id number] Date of Birth: [birth year] Referring Provider (OT): Arther Abbott, MD   Encounter Date: 11/25/2019  OT End of Session - 11/25/19 1513    Visit Number  1    Number of Visits  12    Date for OT Re-Evaluation  01/06/20    Authorization Type  BC[employer] commercial    Authorization Time Period  no copay or prior authorization. 30 visit    Authorization - Visit Number  1    Authorization - Number of Visits  30    OT Start Time  1350    OT Stop Time  1428    OT Time Calculation (min)  38 min    Activity Tolerance  Patient tolerated treatment well    Behavior During Therapy  WFL for tasks assessed/performed       Past Medical History:  Diagnosis Date  . Allergic rhinitis, cause unspecified   . Arthritis   . Coronary atherosclerosis of unspecified type of vessel, native or graft   . GERD (gastroesophageal reflux disease)   . Hypothyroidism   . Proteinuria   . Pure hyperglyceridemia   . Type II or unspecified type diabetes mellitus without mention of complication, not stated as uncontrolled   . Unspecified essential hypertension     Past Surgical History:  Procedure Laterality Date  . COLONOSCOPY N/A 10/02/2019   Procedure: COLONOSCOPY;  Surgeon: Daneil Dolin, MD;  [facility];  Service: Endoscopy;  Laterality: N/A;  9:30  . CYSTECTOMY  1989   removed from end of spine  . PACEMAKER IMPLANT N/A 12/13/2018   Procedure: PACEMAKER IMPLANT;  Surgeon: Evans Lance, MD;  Location: [facility];  Service: Cardiovascular;  Laterality: N/A;  . POLYPECTOMY  10/02/2019   Procedure: POLYPECTOMY;  Surgeon: Daneil Dolin, MD;  [facility];  Service: Endoscopy;;  . [place]  . Right lens implant      There were no  vitals filed for this visit.  Subjective Assessment - 11/25/19 1353    Subjective   S: It was worse before I got the shot.    Pertinent History  Patient is a 64 y/o female S/P left shoulder impingement which began without known cause approximately 2 months ago. Patient has been seeing a Chiropractor for her neck pain which she reports is helping. Shoulder pain began before her neck pain. Dr. Aline Brochure has referred patient to occupational therapy for evaluation and treatment.    Patient Stated Goals  To decrease her arm pain and be able to use it better.    Currently in Pain?  Yes    Pain Score  7     Pain Location  Shoulder    Pain Orientation  Left    Pain Descriptors / Indicators  Sore    Pain Type  Acute pain    Pain Radiating Towards  neck down arm    Pain Onset  More than a month ago    Pain Frequency  Occasional    Aggravating Factors   Pushing or pulling on things.    Pain Relieving Factors  tylenol, prescription pain medication, pain cream, ice, hot water in shower.    Effect of Pain on Daily Activities  Pt continues to push through pain  Multiple Pain Sites  No        [facility] OT Assessment - 11/25/19 1359      Assessment   Medical Diagnosis  Left shoulder impingement    Referring Provider (OT)  Arther Abbott, MD    Onset Date/Surgical Date  --   approximately 2 months   Hand Dominance  Right    Next MD Visit  TBD    Prior Therapy  None for left shoulder      Precautions   Precautions  ICD/Pacemaker      Restrictions   Weight Bearing Restrictions  No      Balance Screen   Has the patient fallen in the past 6 months  No      Home  Environment   Family/patient expects to be discharged to:  Private residence    Living Arrangements  Spouse/significant other      Prior Function   Level of [place]  Part time employment    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      Written Expression   Dominant Hand  Right      Vision - History   Baseline Vision  No visual deficits      Cognition   Overall Cognitive Status  Within Functional Limits for tasks assessed      ROM / Strength   AROM / PROM / Strength  AROM;PROM;Strength      Palpation   Palpation comment  Moderate fascial restrictions in the right upper arm/deltoid region.       AROM   Overall AROM Comments  Assessed seated. IR/er adducted    AROM Assessment Site  Shoulder    Right/Left Shoulder  Left    Left Shoulder Flexion  118 Degrees    Left Shoulder ABduction  95 Degrees    Left Shoulder Internal Rotation  90 Degrees    Left Shoulder External Rotation  55 Degrees      PROM   Overall PROM Comments  Assessed supine. IR/er adducted    PROM Assessment Site  Shoulder    Right/Left Shoulder  Left    Left Shoulder Flexion  126 Degrees    Left Shoulder ABduction  98 Degrees    Left Shoulder Internal Rotation  90 Degrees    Left Shoulder External Rotation  60 Degrees      Strength   Overall Strength Comments  Assessed seated. IR/er adducted    Strength Assessment Site  Shoulder    Right/Left Shoulder  Left    Left Shoulder Flexion  4/5    Left Shoulder ABduction  3-/5    Left Shoulder Internal Rotation  4/5    Left Shoulder External Rotation  4/5                      OT Education - 11/25/19 1513    Education Details  table slides    Person(s) Educated  Patient    Methods  Explanation;Demonstration;Handout;Verbal cues    Comprehension  Returned demonstration;Verbalized understanding       OT Short Term Goals - 11/25/19 1524      OT SHORT TERM GOAL #1   Title  Patient will be educated and independent with her HEP to faciliate her progress in therapy and allow her to return to  using her LUE for 75% or more of daily tasks.    Time  6    Period  Weeks    Status  New    Target Date  01/06/20      OT  SHORT TERM GOAL #2   Title  Patient will increase her LUE strength to 4+/5 in order to lift and management normal weighted household items with less difficulty.    Time  6    Period  Weeks    Status  New      OT SHORT TERM GOAL #3   Title  Patient will increase her LUE A/ROM to [facility] in order to donn/doff her jacket with less difficulty while performing external rotation at her left shoulder.    Time  6    Period  Weeks    Status  New      OT SHORT TERM GOAL #4   Title  Patient will decrease LUE fascial restrictions to min amount or less in order to increase the functional mobility needed to complete pushing and pushing actions.    Time  6    Period  Weeks    Status  New      OT SHORT TERM GOAL #5   Title  Patient will report a decrease in pain level of approximately 4/10 or less when completing functional reaching tasks.    Time  6    Period  Weeks    Status  New               Plan - 11/25/19 1520    Clinical Impression Statement  A: Patient is a 64 y/o female S/P left shoulder impingement causing increased pain, fascial restrictions, and decreased strength and ROM resulting in difficulty completing daily tasks using her LUE as her nondominant extremity.    OT Occupational Profile and History  Problem Focused Assessment - Including review of records relating to presenting problem    Occupational performance deficits (Please refer to evaluation for details):  ADL's;Rest and Sleep;IADL's    Rehab Potential  Excellent    Clinical Decision Making  Limited treatment options, no task modification necessary    Comorbidities Affecting Occupational Performance:  May have comorbidities impacting occupational performance    Modification or Assistance to Complete Evaluation   No modification of tasks or assist necessary to complete eval    OT Frequency  2x / week    OT Duration  6 weeks    OT Treatment/Interventions  Self-care/ADL training;Therapeutic exercise;Manual  Therapy;Neuromuscular education;Ultrasound;Therapeutic activities;DME and/or AE instruction;Cryotherapy;Electrical Stimulation;Moist Heat;Passive range of motion;Patient/family education    Plan  P: Patient will  benefit from skilled OT services to increase functional performance during daily and work tasks while using her LUE. Treatment Plan: Myofascial release, manual stretching, AA/ROM, A/ROM, general shoulder strengthening. Modalities PRN. next session: Complete FOTO. Updated HEP to AA/ROM if able to complete/tolerate with proper form.    Consulted and Agree with Plan of Care  Patient       Patient will benefit from skilled therapeutic intervention in order to improve the following deficits and impairments:           Visit Diagnosis: Acute pain of left shoulder - Plan: Ot plan of care cert/re-cert  Stiffness of left shoulder, not elsewhere classified - Plan: Ot plan of care cert/re-cert  Other symptoms and signs involving the musculoskeletal system - Plan: Ot plan of care cert/re-cert    Problem List Patient Active Problem List   Diagnosis  Date Noted  . Complete heart block ([place]) 12/12/2018  . GERD (gastroesophageal reflux disease) 10/10/2012  . Coronary artery disease 10/06/2011  . Obesity 10/06/2011  . HYPERLIPIDEMIA TYPE I / IV 06/26/2009  . LEG PAIN 06/26/2009  . DIABETES MELLITUS, TYPE II 06/22/2009  . HYPERTENSION, UNSPECIFIED 06/22/2009  . ALLERGIC RHINITIS 06/22/2009   Ailene Ravel, OTR/L,CBIS  [phone]  11/25/2019, 3:29 PM  [facility] [address] [place], [place], [address] Phone: [phone]   Fax:  [phone]  Name: Jordan Perry MRN: [id number] Date of Birth: [birth year]

## 2019-11-27 ENCOUNTER — Encounter (HOSPITAL_COMMUNITY): Payer: Self-pay

## 2019-11-27 ENCOUNTER — Other Ambulatory Visit: Payer: Self-pay

## 2019-11-27 ENCOUNTER — Ambulatory Visit (HOSPITAL_COMMUNITY): Payer: BC Managed Care – PPO

## 2019-11-27 DIAGNOSIS — M25612 Stiffness of left shoulder, not elsewhere classified: Secondary | ICD-10-CM | POA: Diagnosis not present

## 2019-11-27 DIAGNOSIS — M25512 Pain in left shoulder: Secondary | ICD-10-CM

## 2019-11-27 DIAGNOSIS — R29898 Other symptoms and signs involving the musculoskeletal system: Secondary | ICD-10-CM

## 2019-11-27 NOTE — Therapy (Signed)
[place], [place], [address] Phone: [phone]   Fax:  [phone]  Occupational Therapy Treatment  Patient Details  Name: Jordan Perry MRN: [id number] Date of Birth: [birth year] Referring Provider (OT): Arther Abbott, MD   Encounter Date: 11/27/2019  OT End of Session - 11/27/19 1411    Visit Number  2    Number of Visits  12    Date for OT Re-Evaluation  01/06/20    Authorization Type  BCBS commercial    Authorization Time Period  no copay or prior authorization. 30 visit    Authorization - Visit Number  2    Authorization - Number of Visits  30    OT Start Time  1031    OT Stop Time  1109    OT Time Calculation (min)  38 min    Activity Tolerance  Patient tolerated treatment well    Behavior During Therapy  WFL for tasks assessed/performed       Past Medical History:  Diagnosis Date  . Allergic rhinitis, cause unspecified   . Arthritis   . Coronary atherosclerosis of unspecified type of vessel, native or graft   . GERD (gastroesophageal reflux disease)   . Hypothyroidism   . Proteinuria   . Pure hyperglyceridemia   . Type II or unspecified type diabetes mellitus without mention of complication, not stated as uncontrolled   . Unspecified essential hypertension     Past Surgical History:  Procedure Laterality Date  . COLONOSCOPY N/A 10/02/2019   Procedure: COLONOSCOPY;  Surgeon: Daneil Dolin, MD;  [facility];  Service: Endoscopy;  Laterality: N/A;  9:30  . CYSTECTOMY  1989   removed from end of spine  . PACEMAKER IMPLANT N/A 12/13/2018   Procedure: PACEMAKER IMPLANT;  Surgeon: Evans Lance, MD;  Location: [facility];  Service: Cardiovascular;  Laterality: N/A;  . POLYPECTOMY  10/02/2019   Procedure: POLYPECTOMY;  Surgeon: Daneil Dolin, MD;  [facility];  Service: Endoscopy;;  . [place]  . Right lens implant      There were no vitals  filed for this visit.      [facility] OT Assessment - 11/27/19 1102      Assessment   Medical Diagnosis  Left shoulder impingement      Precautions   Precautions  ICD/Pacemaker      Observation/Other Assessments   Focus on Therapeutic Outcomes (FOTO)   57/100               OT Treatments/Exercises (OP) - 11/27/19 1059      Exercises   Exercises  Shoulder      Shoulder Exercises: Supine   Protraction  PROM;10 reps    Horizontal ABduction  PROM;10 reps    External Rotation  PROM;10 reps    Internal Rotation  PROM;10 reps    Flexion  PROM;10 reps    ABduction  PROM;10 reps      Shoulder Exercises: Seated   Extension  AROM;10 reps    Row  AROM;10 reps      Shoulder Exercises: Therapy Ball   Flexion  10 reps    ABduction  10 reps               OT Short Term Goals - 11/27/19 1037      OT SHORT TERM GOAL #1   Title  Patient will be educated and independent with her HEP  to faciliate her progress in therapy and allow her to return to using her LUE for 75% or more of daily tasks.    Time  6    Period  Weeks    Status  On-going    Target Date  01/06/20      OT SHORT TERM GOAL #2   Title  Patient will increase her LUE strength to 4+/5 in order to lift and management normal weighted household items with less difficulty.    Time  6    Period  Weeks    Status  On-going      OT SHORT TERM GOAL #3   Title  Patient will increase her LUE A/ROM to [facility] in order to donn/doff her jacket with less difficulty while performing external rotation at her left shoulder.    Time  6    Period  Weeks    Status  On-going      OT SHORT TERM GOAL #4   Title  Patient will decrease LUE fascial restrictions to min amount or less in order to increase the functional mobility needed to complete pushing and pulling actions.    Time  6    Period  Weeks    Status  On-going      OT SHORT TERM GOAL #5   Title  Patient will report a decrease in pain level of approximately 4/10 or less  when completing functional reaching tasks.    Time  6    Period  Weeks    Status  On-going               Plan - 11/27/19 1411    Clinical Impression Statement  A: Completed FOTO. Reviewed therapy goals. Initiated myofascial release and P/ROM. Pain was present during exercises although patient was able to complete all exercises as tolerated. VC for form and technique were provided. Manual techniques were completed to address fascial restrictions in the LUE.    Body Structure / Function / Physical Skills  ADL;UE functional use;Fascial restriction;Pain;ROM;Strength    Plan  P: Continue with passive ROM if patient reports a high level of pain/soreness from first treatment session. Progress to AA/ROM as able. Complete wall wash.    Consulted and Agree with Plan of Care  Patient       Patient will benefit from skilled therapeutic intervention in order to improve the following deficits and impairments:   Body Structure / Function / Physical Skills: ADL, UE functional use, Fascial restriction, Pain, ROM, Strength       Visit Diagnosis: Acute pain of left shoulder  Stiffness of left shoulder, not elsewhere classified  Other symptoms and signs involving the musculoskeletal system    Problem List Patient Active Problem List   Diagnosis Date Noted  . Complete heart block ([place]) 12/12/2018  . GERD (gastroesophageal reflux disease) 10/10/2012  . Coronary artery disease 10/06/2011  . Obesity 10/06/2011  . HYPERLIPIDEMIA TYPE I / IV 06/26/2009  . LEG PAIN 06/26/2009  . DIABETES MELLITUS, TYPE II 06/22/2009  . HYPERTENSION, UNSPECIFIED 06/22/2009  . ALLERGIC RHINITIS 06/22/2009   Ailene Ravel, OTR/L,CBIS  [phone]  11/27/2019, 2:25 PM  [facility] [address] [place], [place], [address] Phone: [phone]   Fax:  [phone]  Name: Jordan Perry MRN: [id number] Date of Birth: 12-24-1955

## 2019-11-28 DIAGNOSIS — M5033 Other cervical disc degeneration, cervicothoracic region: Secondary | ICD-10-CM | POA: Diagnosis not present

## 2019-11-28 DIAGNOSIS — M9901 Segmental and somatic dysfunction of cervical region: Secondary | ICD-10-CM | POA: Diagnosis not present

## 2019-12-03 ENCOUNTER — Other Ambulatory Visit: Payer: Self-pay

## 2019-12-03 ENCOUNTER — Encounter (HOSPITAL_COMMUNITY): Payer: Self-pay

## 2019-12-03 ENCOUNTER — Ambulatory Visit (HOSPITAL_COMMUNITY): Payer: BC Managed Care – PPO

## 2019-12-03 DIAGNOSIS — M25512 Pain in left shoulder: Secondary | ICD-10-CM

## 2019-12-03 DIAGNOSIS — R29898 Other symptoms and signs involving the musculoskeletal system: Secondary | ICD-10-CM

## 2019-12-03 DIAGNOSIS — M25612 Stiffness of left shoulder, not elsewhere classified: Secondary | ICD-10-CM

## 2019-12-03 NOTE — Therapy (Signed)
[place], [place], [address] Phone: [phone]   Fax:  [phone]  Occupational Therapy Treatment  Patient Details  Name: Jordan Perry MRN: [id number] Date of Birth: Jul 21, 1956 Referring Provider (OT): Arther Abbott, MD   Encounter Date: 12/03/2019  OT End of Session - 12/03/19 0927    Visit Number  2    Number of Visits  12    Date for OT Re-Evaluation  01/06/20    Authorization Type  [employer] commercial    Authorization Time Period  no copay or prior authorization. 30 visit    Authorization - Visit Number  2    Authorization - Number of Visits  30    OT Start Time  0900    OT Stop Time  [phone]    OT Time Calculation (min)  38 min    Activity Tolerance  Patient tolerated treatment well    Behavior During Therapy  WFL for tasks assessed/performed       Past Medical History:  Diagnosis Date  . Allergic rhinitis, cause unspecified   . Arthritis   . Coronary atherosclerosis of unspecified type of vessel, native or graft   . GERD (gastroesophageal reflux disease)   . Hypothyroidism   . Proteinuria   . Pure hyperglyceridemia   . Type II or unspecified type diabetes mellitus without mention of complication, not stated as uncontrolled   . Unspecified essential hypertension     Past Surgical History:  Procedure Laterality Date  . COLONOSCOPY N/A 10/02/2019   Procedure: COLONOSCOPY;  Surgeon: Daneil Dolin, MD;  [facility];  Service: Endoscopy;  Laterality: N/A;  9:30  . CYSTECTOMY  1989   removed from end of spine  . PACEMAKER IMPLANT N/A 12/13/2018   Procedure: PACEMAKER IMPLANT;  Surgeon: Evans Lance, MD;  Location: [facility];  Service: Cardiovascular;  Laterality: N/A;  . POLYPECTOMY  10/02/2019   Procedure: POLYPECTOMY;  Surgeon: Daneil Dolin, MD;  [facility];  Service: Endoscopy;;  . [facility]  . Right lens implant      There were no vitals  filed for this visit.  Subjective Assessment - 12/03/19 0921    Subjective   S: It feels more tight today versus painful.    Currently in Pain?  No/denies         [facility] OT Assessment - 12/03/19 0921      Assessment   Medical Diagnosis  Left shoulder impingement      Precautions   Precautions  ICD/Pacemaker               OT Treatments/Exercises (OP) - 12/03/19 0922      Exercises   Exercises  Shoulder      Shoulder Exercises: Supine   Protraction  PROM;5 reps;AAROM;10 reps    Horizontal ABduction  PROM;5 reps;AAROM;10 reps    External Rotation  PROM;5 reps;AAROM;10 reps    Internal Rotation  PROM;5 reps;AAROM;10 reps    Flexion  PROM;5 reps;AAROM;10 reps    ABduction  PROM;5 reps;AAROM;10 reps      Shoulder Exercises: Standing   Protraction  AAROM;10 reps    Horizontal ABduction  AAROM;10 reps    External Rotation  AAROM;10 reps    Internal Rotation  AAROM;10 reps    Flexion  AAROM;10 reps    ABduction  AAROM;10 reps      Shoulder Exercises: ROM/Strengthening   Wall Wash  1'  Other ROM/Strengthening Exercises  Proximal shoulder strengthening with washcloth on door; 1' flexion 1' abduction      Manual Therapy   Manual Therapy  Myofascial release    Manual therapy comments  Manual therapy completed prior to exercises.     Myofascial Release  Myofascial release and manual stretching completed to left upper arm, trapezius, and scapularis region.              OT Education - 12/03/19 [id number]    Education Details  AA/ROM shoulder exercise. Stop table slides.    Person(s) Educated  Patient    Methods  Explanation;Demonstration;Handout;Verbal cues    Comprehension  Returned demonstration;Verbalized understanding       OT Short Term Goals - 11/27/19 1037      OT SHORT TERM GOAL #1   Title  Patient will be educated and independent with her HEP to faciliate her progress in therapy and allow her to return to using her LUE for 75% or more of daily tasks.     Time  6    Period  Weeks    Status  On-going    Target Date  01/06/20      OT SHORT TERM GOAL #2   Title  Patient will increase her LUE strength to 4+/5 in order to lift and management normal weighted household items with less difficulty.    Time  6    Period  Weeks    Status  On-going      OT SHORT TERM GOAL #3   Title  Patient will increase her LUE A/ROM to [facility] in order to donn/doff her jacket with less difficulty while performing external rotation at her left shoulder.    Time  6    Period  Weeks    Status  On-going      OT SHORT TERM GOAL #4   Title  Patient will decrease LUE fascial restrictions to min amount or less in order to increase the functional mobility needed to complete pushing and pulling actions.    Time  6    Period  Weeks    Status  On-going      OT SHORT TERM GOAL #5   Title  Patient will report a decrease in pain level of approximately 4/10 or less when completing functional reaching tasks.    Time  6    Period  Weeks    Status  On-going               Plan - 12/03/19 [id number]    Clinical Impression Statement  A:Progressed to AA/ROM exercises and updated HEP. P/ROM is functional although limited due to pain during passive stretching. Manual techniques were completed to address fascial restrictions. VC for form and technique were provided.    Body Structure / Function / Physical Skills  ADL;UE functional use;Fascial restriction;Pain;ROM;Strength    Plan  P: Follow up on HEP. Add functional reaching task. Overhead lacing.    Consulted and Agree with Plan of Care  Patient       Patient will benefit from skilled therapeutic intervention in order to improve the following deficits and impairments:   Body Structure / Function / Physical Skills: ADL, UE functional use, Fascial restriction, Pain, ROM, Strength       Visit Diagnosis: Other symptoms and signs involving the musculoskeletal system  Stiffness of left shoulder, not elsewhere  classified  Acute pain of left shoulder    Problem List Patient Active Problem List   Diagnosis  Date Noted  . Complete heart block ([place]) 12/12/2018  . GERD (gastroesophageal reflux disease) 10/10/2012  . Coronary artery disease 10/06/2011  . Obesity 10/06/2011  . HYPERLIPIDEMIA TYPE I / IV 06/26/2009  . LEG PAIN 06/26/2009  . DIABETES MELLITUS, TYPE II 06/22/2009  . HYPERTENSION, UNSPECIFIED 06/22/2009  . ALLERGIC RHINITIS 06/22/2009   Ailene Ravel, OTR/L,CBIS  [phone]  12/03/2019, 9:40 AM  [facility] [address] [place], [place], [address] Phone: [phone]   Fax:  [phone]  Name: DUSTIN KRAS MRN: [id number] Date of Birth: 10/13/1956

## 2019-12-03 NOTE — Patient Instructions (Signed)

## 2019-12-05 ENCOUNTER — Encounter (HOSPITAL_COMMUNITY): Payer: Self-pay

## 2019-12-05 ENCOUNTER — Other Ambulatory Visit: Payer: Self-pay

## 2019-12-05 ENCOUNTER — Ambulatory Visit (HOSPITAL_COMMUNITY): Payer: BC Managed Care – PPO

## 2019-12-05 DIAGNOSIS — M9901 Segmental and somatic dysfunction of cervical region: Secondary | ICD-10-CM | POA: Diagnosis not present

## 2019-12-05 DIAGNOSIS — M25512 Pain in left shoulder: Secondary | ICD-10-CM

## 2019-12-05 DIAGNOSIS — R29898 Other symptoms and signs involving the musculoskeletal system: Secondary | ICD-10-CM

## 2019-12-05 DIAGNOSIS — M25612 Stiffness of left shoulder, not elsewhere classified: Secondary | ICD-10-CM

## 2019-12-05 DIAGNOSIS — M5033 Other cervical disc degeneration, cervicothoracic region: Secondary | ICD-10-CM | POA: Diagnosis not present

## 2019-12-05 NOTE — Therapy (Signed)
[place], [place], [address] Phone: [phone]   Fax:  [phone]  Occupational Therapy Treatment  Patient Details  Name: Jordan Perry MRN: [id number] Date of Birth: 27-Apr-1956 Referring Provider (OT): Arther Abbott, MD   Encounter Date: 12/05/2019  OT End of Session - 12/05/19 0920    Visit Number  3    Number of Visits  12    Date for OT Re-Evaluation  01/06/20    Authorization Type  [employer] commercial    Authorization Time Period  no copay or prior authorization. 30 visit    Authorization - Visit Number  3    Authorization - Number of Visits  30    OT Start Time  0900    OT Stop Time  [phone]    OT Time Calculation (min)  38 min    Activity Tolerance  Patient tolerated treatment well    Behavior During Therapy  WFL for tasks assessed/performed       Past Medical History:  Diagnosis Date  . Allergic rhinitis, cause unspecified   . Arthritis   . Coronary atherosclerosis of unspecified type of vessel, native or graft   . GERD (gastroesophageal reflux disease)   . Hypothyroidism   . Proteinuria   . Pure hyperglyceridemia   . Type II or unspecified type diabetes mellitus without mention of complication, not stated as uncontrolled   . Unspecified essential hypertension     Past Surgical History:  Procedure Laterality Date  . COLONOSCOPY N/A 10/02/2019   Procedure: COLONOSCOPY;  Surgeon: Daneil Dolin, MD;  [facility];  Service: Endoscopy;  Laterality: N/A;  9:30  . CYSTECTOMY  1989   removed from end of spine  . PACEMAKER IMPLANT N/A 12/13/2018   Procedure: PACEMAKER IMPLANT;  Surgeon: Evans Lance, MD;  Location: [place];  Service: Cardiovascular;  Laterality: N/A;  . POLYPECTOMY  10/02/2019   Procedure: POLYPECTOMY;  Surgeon: Daneil Dolin, MD;  [facility];  Service: Endoscopy;;  . [place]  . Right lens implant      There were no  vitals filed for this visit.  Subjective Assessment - 12/05/19 0918    Subjective   S: It was sore after last session. My husband massaged it.    Currently in Pain?  No/denies         [facility] OT Assessment - 12/05/19 0920      Assessment   Medical Diagnosis  Left shoulder impingement      Precautions   Precautions  ICD/Pacemaker               OT Treatments/Exercises (OP) - 12/05/19 0920      Exercises   Exercises  Shoulder      Shoulder Exercises: Supine   Protraction  PROM;5 reps;AAROM;10 reps    Horizontal ABduction  PROM;5 reps;AAROM;10 reps    External Rotation  PROM;5 reps;AAROM;10 reps    Internal Rotation  PROM;5 reps;AAROM;10 reps    Flexion  PROM;5 reps;AAROM;10 reps    ABduction  PROM;5 reps;AAROM;10 reps      Shoulder Exercises: Standing   Flexion  AAROM;10 reps    ABduction  AAROM;10 reps      Shoulder Exercises: ROM/Strengthening   Over Head Lace  1' seated      Functional Reaching Activities   High Level  High level functional reaching tasks completed while placing 10 cones into middle shelf while  attempting to reach top shelf each time.       Manual Therapy   Manual Therapy  Myofascial release    Manual therapy comments  Manual therapy completed prior to exercises.     Myofascial Release  Myofascial release and manual stretching completed to left upper arm, trapezius, and scapularis region.                OT Short Term Goals - 11/27/19 1037      OT SHORT TERM GOAL #1   Title  Patient will be educated and independent with her HEP to faciliate her progress in therapy and allow her to return to using her LUE for 75% or more of daily tasks.    Time  6    Period  Weeks    Status  On-going    Target Date  01/06/20      OT SHORT TERM GOAL #2   Title  Patient will increase her LUE strength to 4+/5 in order to lift and management normal weighted household items with less difficulty.    Time  6    Period  Weeks    Status  On-going       OT SHORT TERM GOAL #3   Title  Patient will increase her LUE A/ROM to [facility] in order to donn/doff her jacket with less difficulty while performing external rotation at her left shoulder.    Time  6    Period  Weeks    Status  On-going      OT SHORT TERM GOAL #4   Title  Patient will decrease LUE fascial restrictions to min amount or less in order to increase the functional mobility needed to complete pushing and pulling actions.    Time  6    Period  Weeks    Status  On-going      OT SHORT TERM GOAL #5   Title  Patient will report a decrease in pain level of approximately 4/10 or less when completing functional reaching tasks.    Time  6    Period  Weeks    Status  On-going               Plan - 12/05/19 0944    Clinical Impression Statement  A: Slightly more passive ROM tolerated this date although continues to be limited by pain when stretching. VC for form and technique were needed during session. Manual techniques were provided to address fascial restrictions in the upper arm/deltoid region.    Body Structure / Function / Physical Skills  ADL;UE functional use;Fascial restriction;Pain;ROM;Strength    Plan  P: UBE bike in reverse. Scapular strengthening with red band.    Consulted and Agree with Plan of Care  Patient       Patient will benefit from skilled therapeutic intervention in order to improve the following deficits and impairments:   Body Structure / Function / Physical Skills: ADL, UE functional use, Fascial restriction, Pain, ROM, Strength       Visit Diagnosis: Other symptoms and signs involving the musculoskeletal system  Stiffness of left shoulder, not elsewhere classified  Acute pain of left shoulder    Problem List Patient Active Problem List   Diagnosis Date Noted  . Complete heart block ([place]) 12/12/2018  . GERD (gastroesophageal reflux disease) 10/10/2012  . Coronary artery disease 10/06/2011  . Obesity 10/06/2011  . HYPERLIPIDEMIA TYPE I  / IV 06/26/2009  . LEG PAIN 06/26/2009  . DIABETES MELLITUS, TYPE II  06/22/2009  . HYPERTENSION, UNSPECIFIED 06/22/2009  . ALLERGIC RHINITIS 06/22/2009   Ailene Ravel, OTR/L,CBIS  [phone]  12/05/2019, 9:47 AM  [place] [address] [place], [place], [address] Phone: [phone]   Fax:  [phone]  Name: Jordan Perry MRN: [id number] Date of Birth: 1956/01/19

## 2019-12-10 ENCOUNTER — Telehealth (HOSPITAL_COMMUNITY): Payer: Self-pay

## 2019-12-10 ENCOUNTER — Ambulatory Visit (HOSPITAL_COMMUNITY): Payer: BC Managed Care – PPO

## 2019-12-10 NOTE — Telephone Encounter (Signed)
pt's husband called to cancel this appt due to their power outage

## 2019-12-12 ENCOUNTER — Ambulatory Visit (HOSPITAL_COMMUNITY): Payer: BC Managed Care – PPO

## 2019-12-12 IMAGING — DX DG FOOT COMPLETE 3+V*R*
3 series · 3 of 3 positions shown · non-contrast
Comparison: Ankle radiographs earlier this day.

CLINICAL DATA: Right foot pain after fall in parking lot today.

EXAM:
RIGHT FOOT COMPLETE - 3+ VIEW

[foot ap]
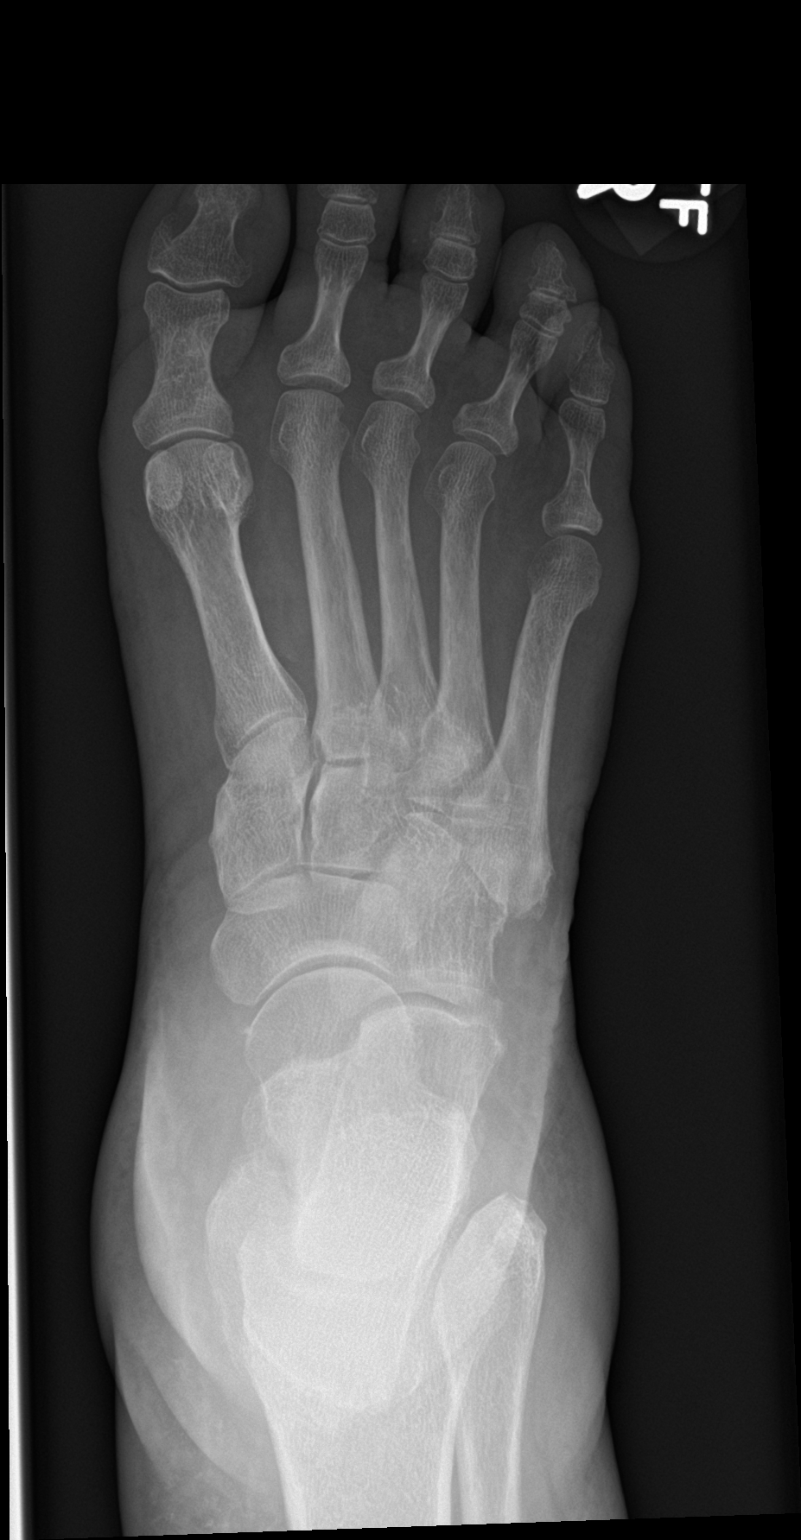

[foot obl]
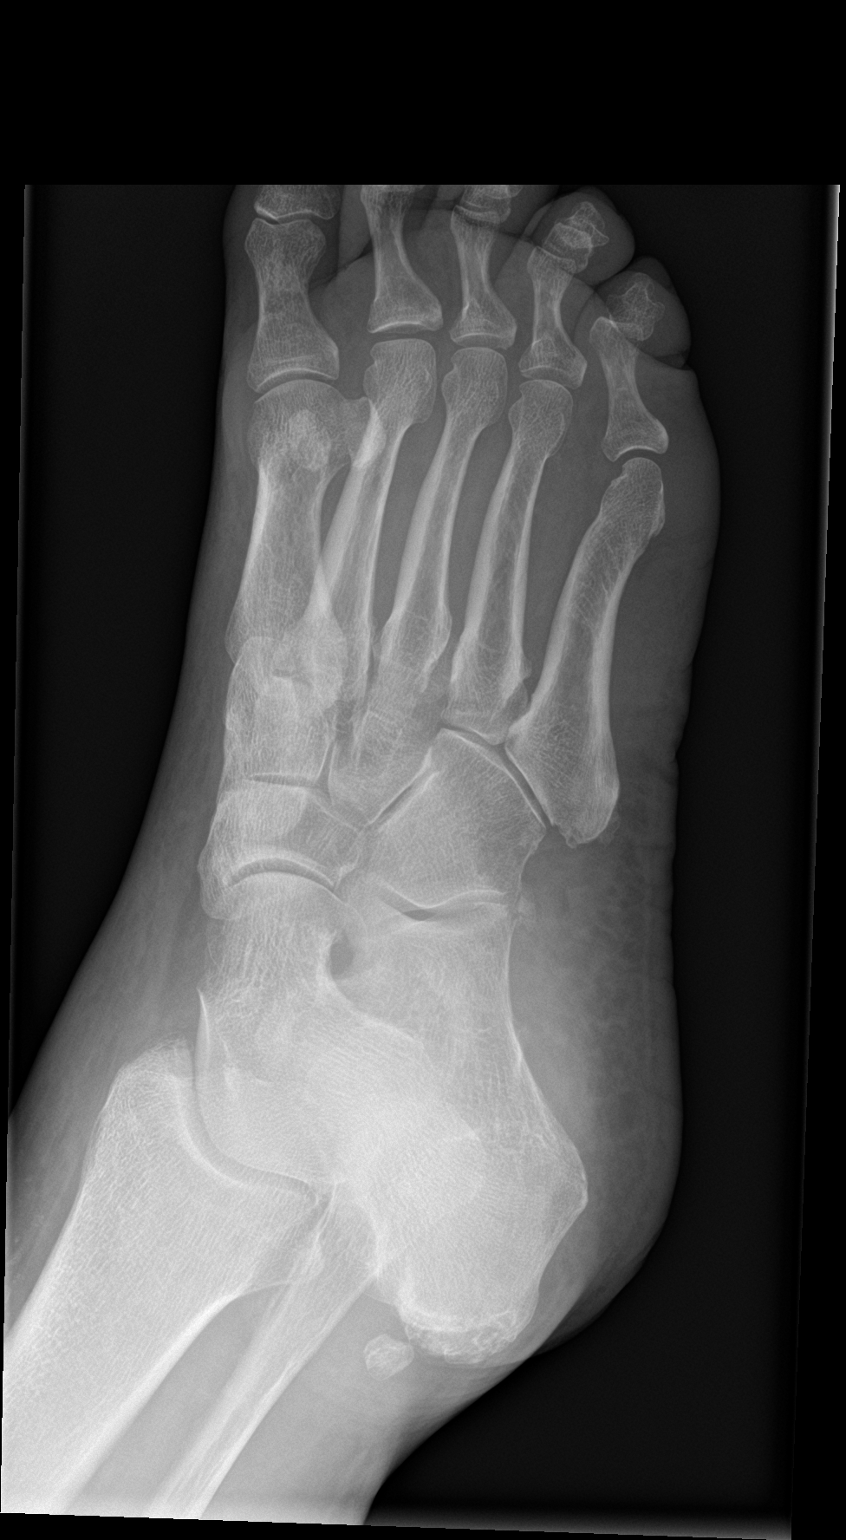

[foot lat]
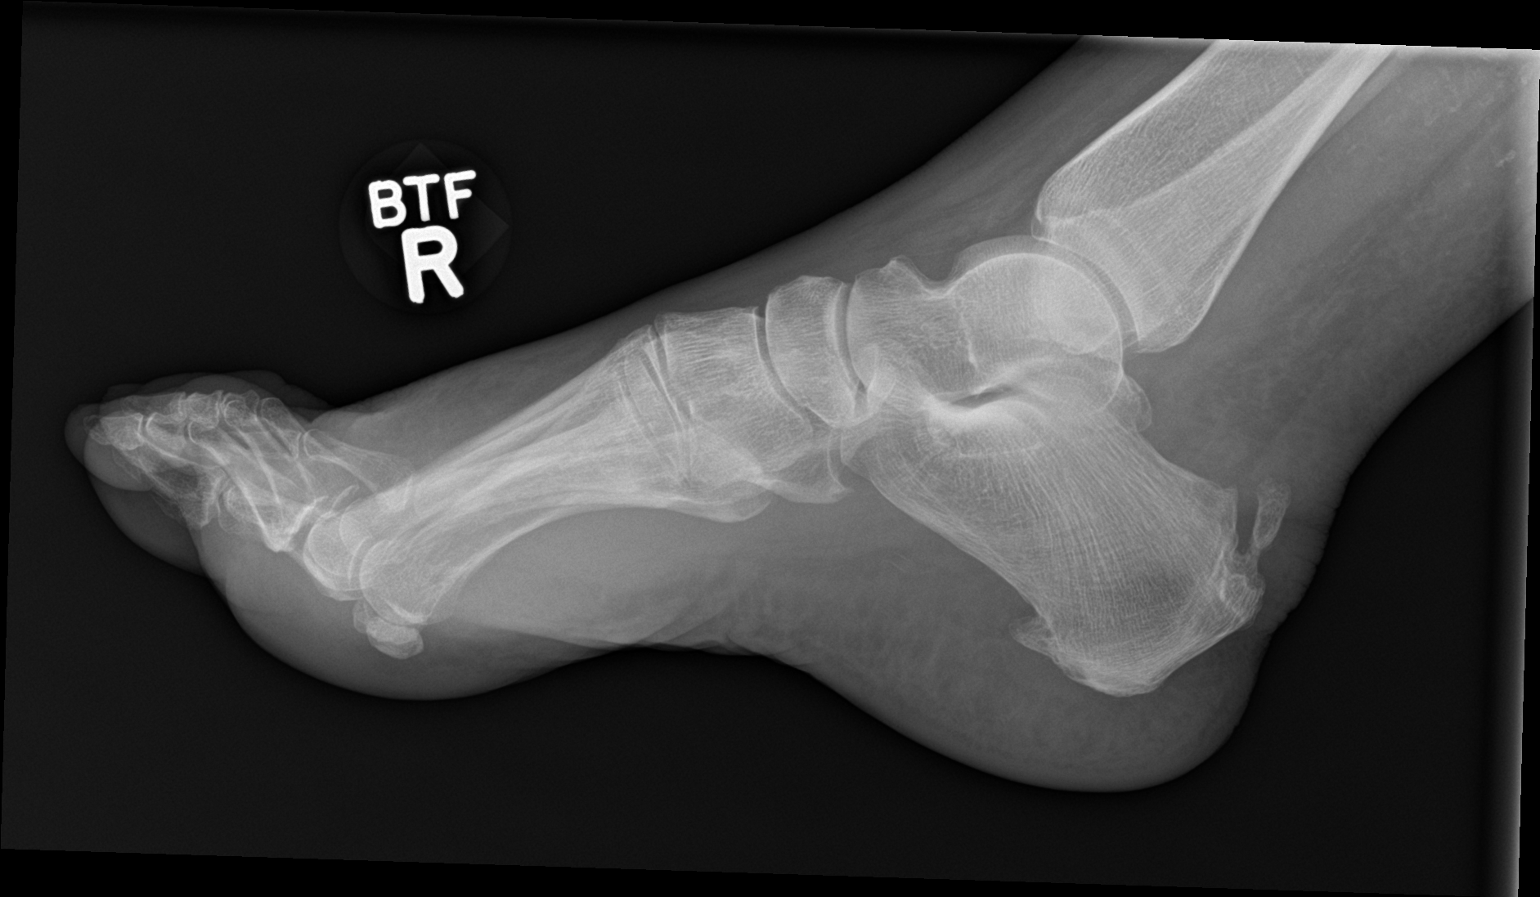

[3 of 3 positions shown; findings below may reference images not displayed]

FINDINGS: There is no evidence of fracture or dislocation. Mild hammertoe
deformity of the digits. Fragmentation at the Achilles tendon
insertion. Plantar calcaneal spur. Mild soft tissue edema over the
dorsum of the foot of uncertain acuity..
IMPRESSION: No fracture or dislocation of the right foot.

## 2019-12-12 IMAGING — DX DG ANKLE COMPLETE 3+V*R*
3 series · 3 of 3 positions shown · non-contrast
Comparison: None.

CLINICAL DATA: Patient became weak and fell in a parking lot this
afternoon. Right ankle pain.

EXAM:
RIGHT ANKLE - COMPLETE 3+ VIEW

[ankle ap]
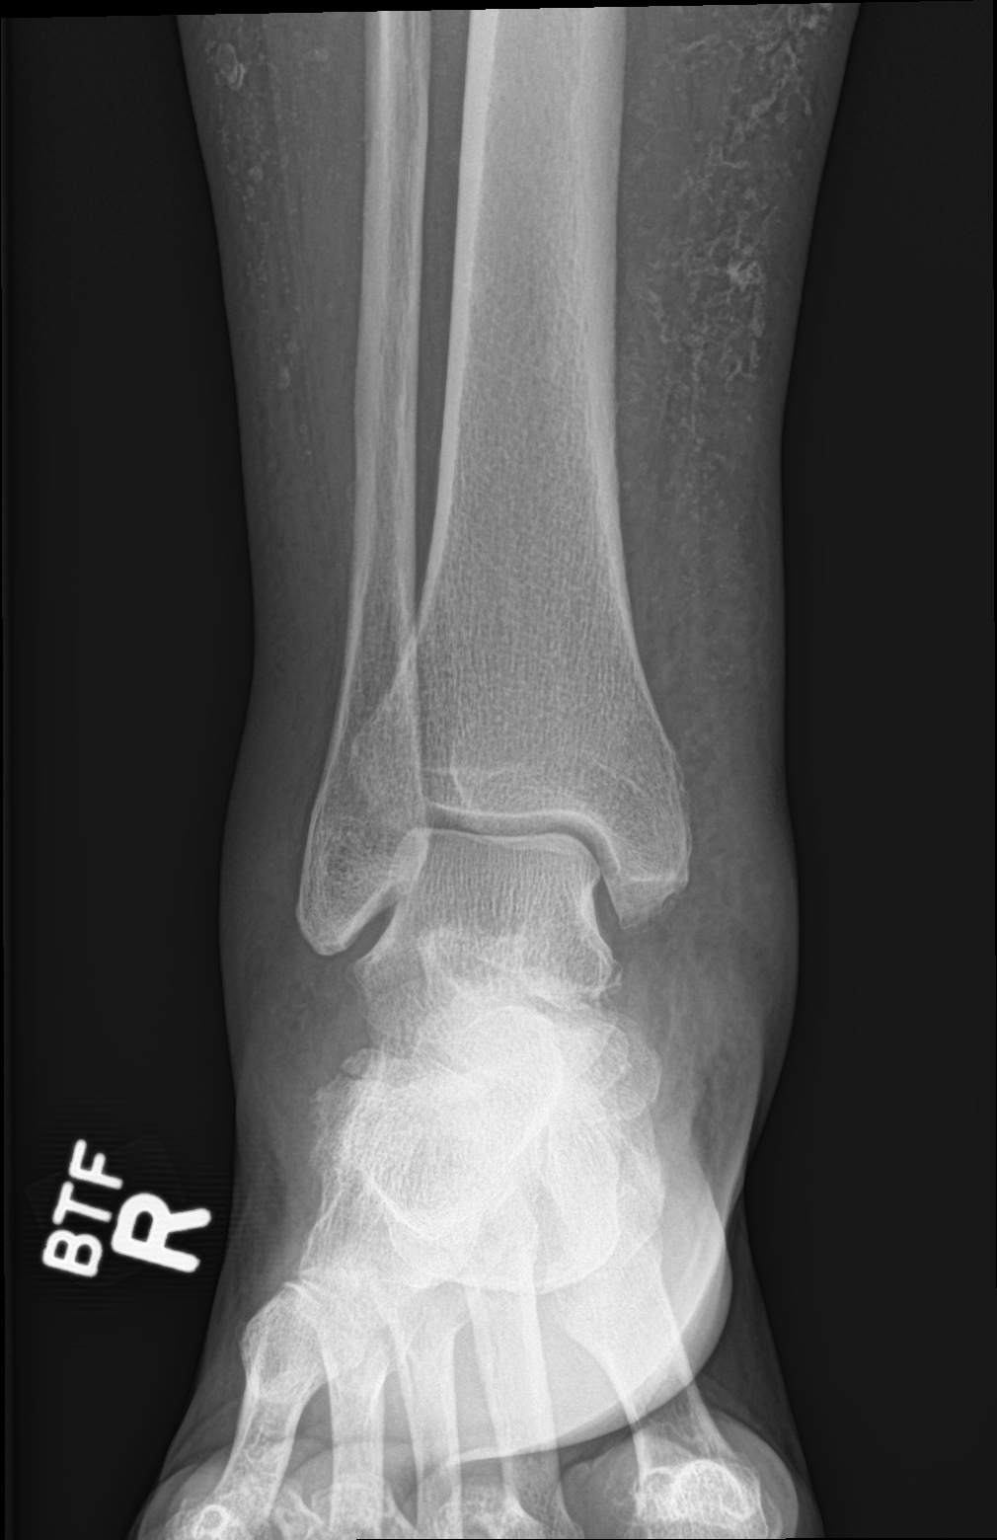

[ankle obl]
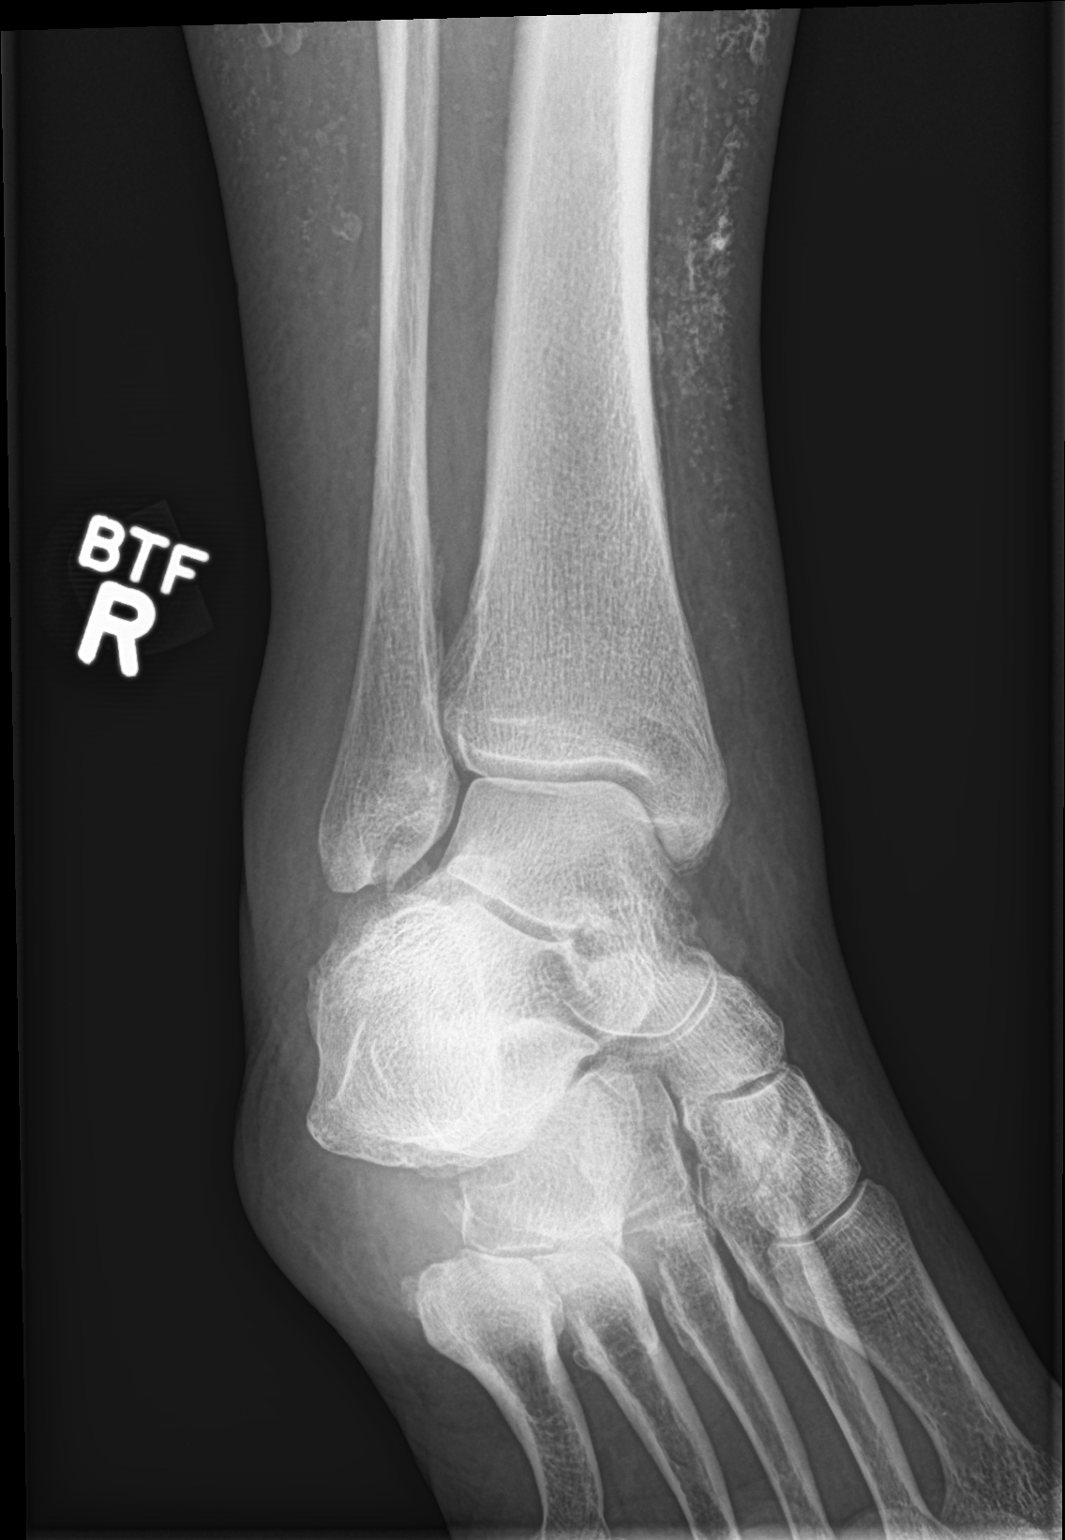

[ankle lat]
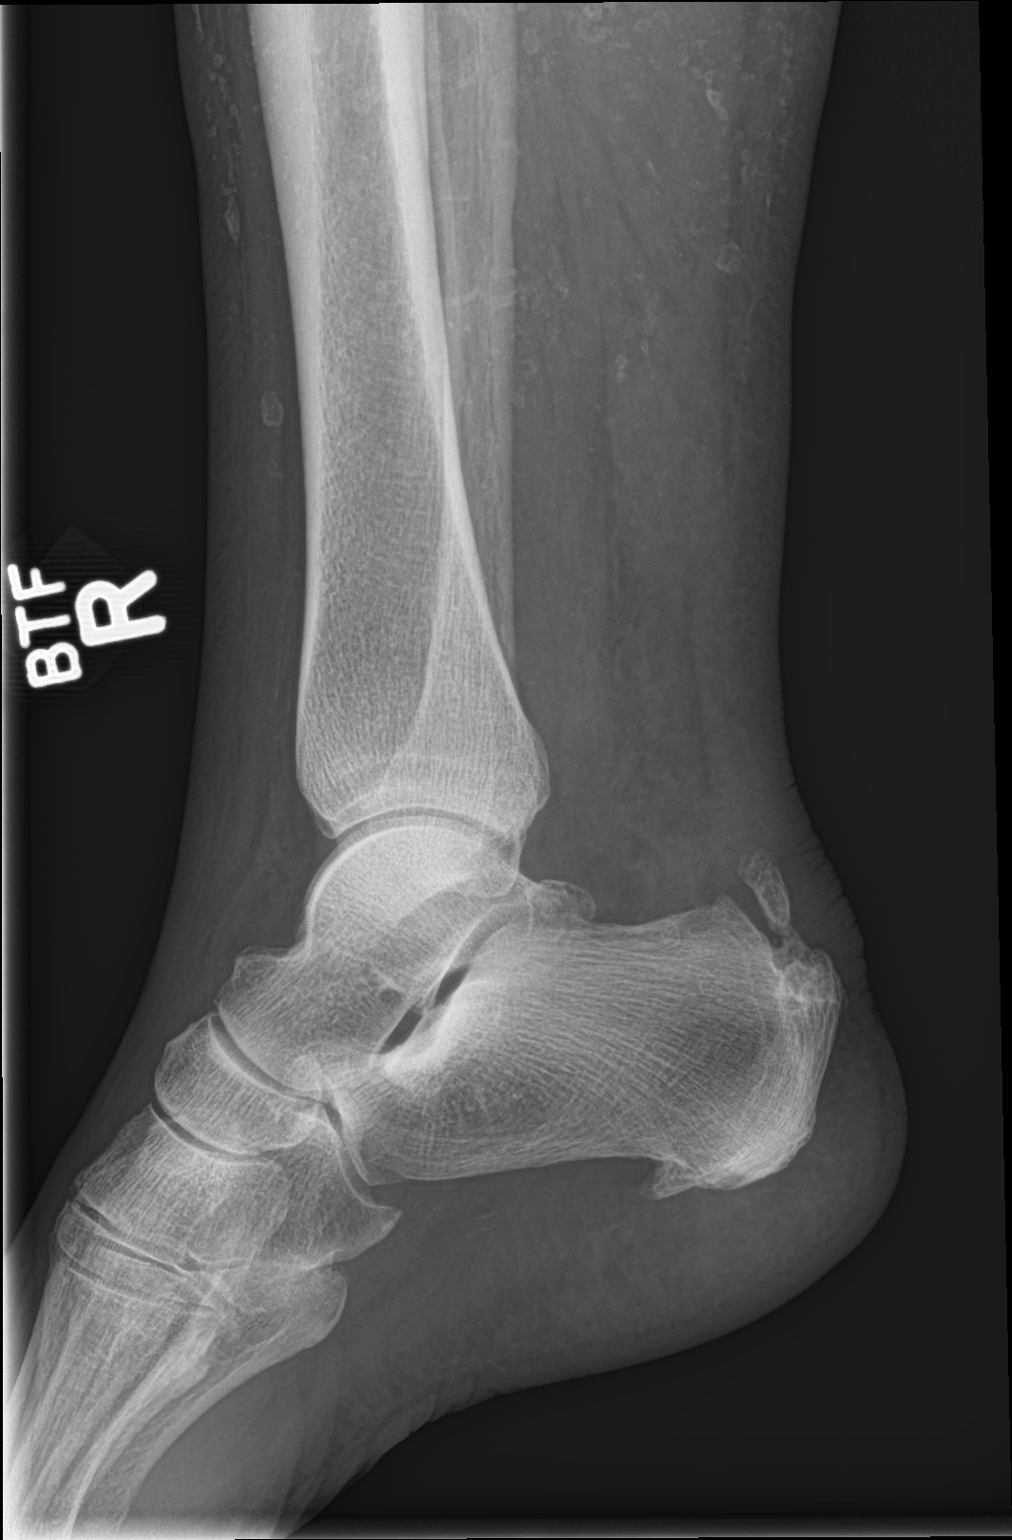

[3 of 3 positions shown; findings below may reference images not displayed]

FINDINGS: There is diffuse soft tissue swelling of the included right leg and
about the malleoli. Calcifications within the soft tissues of the
right leg likely related to chronic venous stasis. Ankle mortise
appears intact. No fracture or joint dislocation. Prominent
calcaneal enthesophytes are noted along the plantar dorsal aspect.
Intact midfoot and subtalar joints.
IMPRESSION: 1. Soft tissue swelling about the included right leg and ankle
without fracture or joint dislocation.
2. Scattered soft tissue calcifications likely reflect chronic
venous stasis. As these are not sheet like calcifications, collagen
vascular disease such as dermatomyositis is believed less likely.

## 2019-12-17 ENCOUNTER — Encounter (HOSPITAL_COMMUNITY): Payer: Self-pay

## 2019-12-17 ENCOUNTER — Ambulatory Visit (HOSPITAL_COMMUNITY): Payer: BC Managed Care – PPO

## 2019-12-17 ENCOUNTER — Other Ambulatory Visit: Payer: Self-pay

## 2019-12-17 DIAGNOSIS — M25512 Pain in left shoulder: Secondary | ICD-10-CM | POA: Diagnosis not present

## 2019-12-17 DIAGNOSIS — M25612 Stiffness of left shoulder, not elsewhere classified: Secondary | ICD-10-CM

## 2019-12-17 DIAGNOSIS — M9901 Segmental and somatic dysfunction of cervical region: Secondary | ICD-10-CM | POA: Diagnosis not present

## 2019-12-17 DIAGNOSIS — R29898 Other symptoms and signs involving the musculoskeletal system: Secondary | ICD-10-CM | POA: Diagnosis not present

## 2019-12-17 DIAGNOSIS — M5033 Other cervical disc degeneration, cervicothoracic region: Secondary | ICD-10-CM | POA: Diagnosis not present

## 2019-12-17 NOTE — Therapy (Signed)
[place], [place], [address] Phone: [phone]   Fax:  [phone]  Occupational Therapy Treatment  Patient Details  Name: Jordan Perry MRN: [id number] Date of Birth: 1955-11-04 Referring Provider (OT): Arther Abbott, MD   Encounter Date: 12/17/2019  OT End of Session - 12/17/19 0929    Visit Number  4    Number of Visits  12    Date for OT Re-Evaluation  01/06/20    Authorization Type  BC[employer] commercial    Authorization Time Period  no copay or prior authorization. 30 visit    Authorization - Visit Number  4    Authorization - Number of Visits  30    OT Start Time  0900    OT Stop Time  [phone]    OT Time Calculation (min)  38 min    Activity Tolerance  Patient tolerated treatment well    Behavior During Therapy  WFL for tasks assessed/performed       Past Medical History:  Diagnosis Date  . Allergic rhinitis, cause unspecified   . Arthritis   . Coronary atherosclerosis of unspecified type of vessel, native or graft   . GERD (gastroesophageal reflux disease)   . Hypothyroidism   . Proteinuria   . Pure hyperglyceridemia   . Type II or unspecified type diabetes mellitus without mention of complication, not stated as uncontrolled   . Unspecified essential hypertension     Past Surgical History:  Procedure Laterality Date  . COLONOSCOPY N/A 10/02/2019   Procedure: COLONOSCOPY;  Surgeon: Daneil Dolin, MD;  [facility];  Service: Endoscopy;  Laterality: N/A;  9:30  . CYSTECTOMY  1989   removed from end of spine  . PACEMAKER IMPLANT N/A 12/13/2018   Procedure: PACEMAKER IMPLANT;  Surgeon: Evans Lance, MD;  Location: [place] [facility];  Service: Cardiovascular;  Laterality: N/A;  . POLYPECTOMY  10/02/2019   Procedure: POLYPECTOMY;  Surgeon: Daneil Dolin, MD;  [facility];  Service: Endoscopy;;  . [place]  . Right lens implant      There were no  vitals filed for this visit.  Subjective Assessment - 12/17/19 0924    Subjective   S: It was really sore with all the rain.    Currently in Pain?  Yes    Pain Score  3     Pain Location  Shoulder    Pain Orientation  Left    Pain Descriptors / Indicators  Sore    Pain Type  Acute pain    Pain Radiating Towards  N/A    Pain Onset  In the past 7 days    Pain Frequency  Constant    Aggravating Factors   weather    Pain Relieving Factors  tylenol, prescription pain medication, pain cream, ice, hot water in shower    Effect of Pain on Daily Activities  pushes through pain    Multiple Pain Sites  No         [facility] OT Assessment - 12/17/19 0925      Assessment   Medical Diagnosis  Left shoulder impingement      Precautions   Precautions  ICD/Pacemaker               OT Treatments/Exercises (OP) - 12/17/19 0925      Exercises   Exercises  Shoulder      Shoulder Exercises: Supine   Protraction  PROM;5 reps;AROM;10 reps    Horizontal ABduction  PROM;5 reps;AROM;10 reps    External Rotation  PROM;5 reps;AROM;10 reps    Internal Rotation  PROM;5 reps;AROM;10 reps    Flexion  PROM;5 reps;AROM;10 reps    ABduction  PROM;5 reps;AROM;10 reps;Limitations    ABduction Limitations  to shoulder level. tactile cues for form      Shoulder Exercises: Standing   Protraction  AROM;10 reps    Horizontal ABduction  AROM;10 reps    External Rotation  AROM;10 reps    Internal Rotation  AROM;10 reps    Flexion  AROM;10 reps    ABduction  AROM;10 reps;Limitations    ABduction Limitations  to shoulder level      Shoulder Exercises: ROM/Strengthening   UBE (Upper Arm Bike)  Level 1 2' reverse only    pace: 4.5-5.5   Over Head Lace  1' seated    Proximal Shoulder Strengthening, Supine  10X A/ROM no rest breaks    Proximal Shoulder Strengthening, Seated  10X A/ROM no rest breaks      Manual Therapy   Manual Therapy  Myofascial release    Manual therapy comments  Manual therapy  completed prior to exercises.     Myofascial Release  Myofascial release and manual stretching completed to left upper arm, trapezius, and scapularis region.              OT Education - 12/17/19 0935    Education Details  A/ROM shoulder exercises. Stop AA/ROM    Person(s) Educated  Patient    Methods  Explanation;Demonstration;Handout;Verbal cues    Comprehension  Returned demonstration;Verbalized understanding       OT Short Term Goals - 11/27/19 1037      OT SHORT TERM GOAL #1   Title  Patient will be educated and independent with her HEP to faciliate her progress in therapy and allow her to return to using her LUE for 75% or more of daily tasks.    Time  6    Period  Weeks    Status  On-going    Target Date  01/06/20      OT SHORT TERM GOAL #2   Title  Patient will increase her LUE strength to 4+/5 in order to lift and management normal weighted household items with less difficulty.    Time  6    Period  Weeks    Status  On-going      OT SHORT TERM GOAL #3   Title  Patient will increase her LUE A/ROM to [facility] in order to donn/doff her jacket with less difficulty while performing external rotation at her left shoulder.    Time  6    Period  Weeks    Status  On-going      OT SHORT TERM GOAL #4   Title  Patient will decrease LUE fascial restrictions to min amount or less in order to increase the functional mobility needed to complete pushing and pulling actions.    Time  6    Period  Weeks    Status  On-going      OT SHORT TERM GOAL #5   Title  Patient will report a decrease in pain level of approximately 4/10 or less when completing functional reaching tasks.    Time  6    Period  Weeks    Status  On-going               Plan - 12/17/19 [id number]  Clinical Impression Statement  A: Progressed to A/ROM supine and standing while adding UBE bike in reverse at the end to work on shoulder endurance.Tracie Harrier for form and technique as needed. Manual techniques completed  to address fascial restrictions.    Body Structure / Function / Physical Skills  ADL;UE functional use;Fascial restriction;Pain;ROM;Strength    Plan  P: Add scapular strengthening with red band.    OT Home Exercise Plan  2/23: AA/ROM shoulder exercises standing    Consulted and Agree with Plan of Care  Patient       Patient will benefit from skilled therapeutic intervention in order to improve the following deficits and impairments:   Body Structure / Function / Physical Skills: ADL, UE functional use, Fascial restriction, Pain, ROM, Strength       Visit Diagnosis: Other symptoms and signs involving the musculoskeletal system  Stiffness of left shoulder, not elsewhere classified  Acute pain of left shoulder    Problem List Patient Active Problem List   Diagnosis Date Noted  . Complete heart block ([place]) 12/12/2018  . GERD (gastroesophageal reflux disease) 10/10/2012  . Coronary artery disease 10/06/2011  . Obesity 10/06/2011  . HYPERLIPIDEMIA TYPE I / IV 06/26/2009  . LEG PAIN 06/26/2009  . DIABETES MELLITUS, TYPE II 06/22/2009  . HYPERTENSION, UNSPECIFIED 06/22/2009  . ALLERGIC RHINITIS 06/22/2009   Ailene Ravel, OTR/L,CBIS  [phone]  12/17/2019, 9:46 AM  [place] [address] [place], [place], [address] Phone: [phone]   Fax:  [phone]  Name: SHAELAH MEISEL MRN: [id number] Date of Birth: 09-27-1956

## 2019-12-17 NOTE — Patient Instructions (Signed)
Repeat all exercises 10-15 times, 1-2 times per day.  1) Shoulder Protraction    Begin with elbows by your side, slowly "punch" straight out in front of you.      2) Shoulder Flexion  Standing:         Begin with arms at your side with thumbs pointed up, slowly raise both arms up and forward towards overhead.       3) Horizontal abduction/adduction    Standing:           Begin with arms straight out in front of you, bring out to the side in at "T" shape. Keep arms straight entire time.      4) Internal & External Rotation    *No band* -Stand with elbows at the side and elbows bent 90 degrees. Move your forearms away from your body, then bring back inward toward the body.     5) Shoulder Abduction  Standing:       Begin with your arms flat on the table next to your side. Slowly move your arms out to the side so that they go overhead, in a jumping jack or snow angel movement.         

## 2019-12-18 ENCOUNTER — Ambulatory Visit (INDEPENDENT_AMBULATORY_CARE_PROVIDER_SITE_OTHER): Payer: BC Managed Care – PPO | Admitting: *Deleted

## 2019-12-18 DIAGNOSIS — I442 Atrioventricular block, complete: Secondary | ICD-10-CM | POA: Diagnosis not present

## 2019-12-18 LAB — CUP PACEART REMOTE DEVICE CHECK
Battery Remaining Longevity: 125 mo
Battery Remaining Percentage: 95.5 %
Battery Voltage: 3.01 V
Brady Statistic AP VP Percent: 1.2 %
Brady Statistic AP VS Percent: 1 %
Brady Statistic AS VP Percent: 93 %
Brady Statistic AS VS Percent: 5.7 %
Brady Statistic RA Percent Paced: 1.1 %
Brady Statistic RV Percent Paced: 94 %
Date Time Interrogation Session: 20210224020012
Implantable Lead Implant Date: 20200220
Implantable Lead Implant Date: 20200220
Implantable Lead Location: 753859
Implantable Lead Location: 753860
Implantable Pulse Generator Implant Date: 20200220
Lead Channel Impedance Value: 430 Ohm
Lead Channel Impedance Value: 440 Ohm
Lead Channel Pacing Threshold Amplitude: 0.375 V
Lead Channel Pacing Threshold Amplitude: 0.625 V
Lead Channel Pacing Threshold Pulse Width: 0.5 ms
Lead Channel Pacing Threshold Pulse Width: 0.5 ms
Lead Channel Sensing Intrinsic Amplitude: 11.8 mV
Lead Channel Sensing Intrinsic Amplitude: 4 mV
Lead Channel Setting Pacing Amplitude: 0.875
Lead Channel Setting Pacing Amplitude: 1.375
Lead Channel Setting Pacing Pulse Width: 0.5 ms
Lead Channel Setting Sensing Sensitivity: 4 mV
Pulse Gen Model: 2272
Pulse Gen Serial Number: 9107223

## 2019-12-18 NOTE — Progress Notes (Signed)
PPM Remote  

## 2019-12-19 ENCOUNTER — Other Ambulatory Visit: Payer: Self-pay

## 2019-12-19 ENCOUNTER — Ambulatory Visit (HOSPITAL_COMMUNITY): Payer: BC Managed Care – PPO

## 2019-12-19 ENCOUNTER — Encounter (HOSPITAL_COMMUNITY): Payer: Self-pay

## 2019-12-19 DIAGNOSIS — R29898 Other symptoms and signs involving the musculoskeletal system: Secondary | ICD-10-CM

## 2019-12-19 DIAGNOSIS — M25512 Pain in left shoulder: Secondary | ICD-10-CM

## 2019-12-19 DIAGNOSIS — M25612 Stiffness of left shoulder, not elsewhere classified: Secondary | ICD-10-CM | POA: Diagnosis not present

## 2019-12-19 NOTE — Therapy (Signed)
[place], [place], [address] Phone: [phone]   Fax:  [phone]  Occupational Therapy Treatment  Patient Details  Name: Jordan Perry MRN: [id number] Date of Birth: 11/05/1955 Referring Provider (OT): Arther Abbott, MD   Encounter Date: 12/19/2019  OT End of Session - 12/19/19 1420    Visit Number  5    Number of Visits  12    Date for OT Re-Evaluation  01/06/20    Authorization Type  [employer] commercial    Authorization Time Period  no copay or prior authorization. 30 visit    Authorization - Visit Number  5    Authorization - Number of Visits  30    OT Start Time  1030    OT Stop Time  1108    OT Time Calculation (min)  38 min    Activity Tolerance  Patient tolerated treatment well    Behavior During Therapy  WFL for tasks assessed/performed       Past Medical History:  Diagnosis Date  . Allergic rhinitis, cause unspecified   . Arthritis   . Coronary atherosclerosis of unspecified type of vessel, native or graft   . GERD (gastroesophageal reflux disease)   . Hypothyroidism   . Proteinuria   . Pure hyperglyceridemia   . Type II or unspecified type diabetes mellitus without mention of complication, not stated as uncontrolled   . Unspecified essential hypertension     Past Surgical History:  Procedure Laterality Date  . COLONOSCOPY N/A 10/02/2019   Procedure: COLONOSCOPY;  Surgeon: Daneil Dolin, MD;  [facility];  Service: Endoscopy;  Laterality: N/A;  9:30  . CYSTECTOMY  1989   removed from end of spine  . PACEMAKER IMPLANT N/A 12/13/2018   Procedure: PACEMAKER IMPLANT;  Surgeon: Evans Lance, MD;  Location: [place] [facility];  Service: Cardiovascular;  Laterality: N/A;  . POLYPECTOMY  10/02/2019   Procedure: POLYPECTOMY;  Surgeon: Daneil Dolin, MD;  [facility];  Service: Endoscopy;;  . [place]  . Right lens implant      There were no  vitals filed for this visit.  Subjective Assessment - 12/19/19 1046    Subjective   S: It's just a little sore.    Currently in Pain?  Yes    Pain Score  3     Pain Location  Shoulder    Pain Orientation  Left    Pain Descriptors / Indicators  Sore    Pain Type  Acute pain         [facility] OT Assessment - 12/19/19 1047      Assessment   Medical Diagnosis  Left shoulder impingement      Precautions   Precautions  ICD/Pacemaker               OT Treatments/Exercises (OP) - 12/19/19 1047      Exercises   Exercises  Shoulder      Shoulder Exercises: Supine   Protraction  PROM;5 reps;AROM;10 reps    Horizontal ABduction  PROM;5 reps;AROM;10 reps    External Rotation  PROM;5 reps;AROM;10 reps    Internal Rotation  PROM;5 reps;AROM;10 reps    Flexion  PROM;5 reps;AROM;10 reps    ABduction  PROM;5 reps;AROM;10 reps;Limitations    ABduction Limitations  to shoulder level. tactile cues for form      Shoulder Exercises: Standing   Protraction  AROM;10 reps  Horizontal ABduction  AROM;10 reps    External Rotation  AROM;10 reps    Internal Rotation  AROM;10 reps    Flexion  AROM;10 reps    ABduction  AROM;10 reps;Limitations    ABduction Limitations  to shoulder level    Extension  Theraband;10 reps    Theraband Level (Shoulder Extension)  Level 2 (Red)    Row  Theraband;10 reps    Theraband Level (Shoulder Row)  Level 2 (Red)    Retraction  Theraband;10 reps    Theraband Level (Shoulder Retraction)  Level 2 (Red)      Shoulder Exercises: ROM/Strengthening   UBE (Upper Arm Bike)  Level 1 2' reverse 2' forward    pace: 4.5-5.5     Manual Therapy   Manual Therapy  Myofascial release    Manual therapy comments  Manual therapy completed prior to exercises.     Myofascial Release  Myofascial release and manual stretching completed to left upper arm, trapezius, and scapularis region.                OT Short Term Goals - 11/27/19 1037      OT SHORT TERM  GOAL #1   Title  Patient will be educated and independent with her HEP to faciliate her progress in therapy and allow her to return to using her LUE for 75% or more of daily tasks.    Time  6    Period  Weeks    Status  On-going    Target Date  01/06/20      OT SHORT TERM GOAL #2   Title  Patient will increase her LUE strength to 4+/5 in order to lift and management normal weighted household items with less difficulty.    Time  6    Period  Weeks    Status  On-going      OT SHORT TERM GOAL #3   Title  Patient will increase her LUE A/ROM to [facility] in order to donn/doff her jacket with less difficulty while performing external rotation at her left shoulder.    Time  6    Period  Weeks    Status  On-going      OT SHORT TERM GOAL #4   Title  Patient will decrease LUE fascial restrictions to min amount or less in order to increase the functional mobility needed to complete pushing and pulling actions.    Time  6    Period  Weeks    Status  On-going      OT SHORT TERM GOAL #5   Title  Patient will report a decrease in pain level of approximately 4/10 or less when completing functional reaching tasks.    Time  6    Period  Weeks    Status  On-going               Plan - 12/19/19 1420    Clinical Impression Statement  A: Added scapular strengthening with red theraband. patient was provided with VC for form and technique during session. Manual techniques completed to addres fascial restrictions in the anterior to medial region of upper arm and edge of bicep.    Body Structure / Function / Physical Skills  ADL;UE functional use;Fascial restriction;Pain;ROM;Strength    Plan  P: Continue with A/ROM exercises to increase shoulder and scapular strength. Work on achieving more range during abduction as able.    Consulted and Agree with Plan of Care  Patient  Patient will benefit from skilled therapeutic intervention in order to improve the following deficits and impairments:    Body Structure / Function / Physical Skills: ADL, UE functional use, Fascial restriction, Pain, ROM, Strength       Visit Diagnosis: Stiffness of left shoulder, not elsewhere classified  Acute pain of left shoulder  Other symptoms and signs involving the musculoskeletal system    Problem List Patient Active Problem List   Diagnosis Date Noted  . Complete heart block ([place]) 12/12/2018  . GERD (gastroesophageal reflux disease) 10/10/2012  . Coronary artery disease 10/06/2011  . Obesity 10/06/2011  . HYPERLIPIDEMIA TYPE I / IV 06/26/2009  . LEG PAIN 06/26/2009  . DIABETES MELLITUS, TYPE II 06/22/2009  . HYPERTENSION, UNSPECIFIED 06/22/2009  . ALLERGIC RHINITIS 06/22/2009   Ailene Ravel, OTR/L,CBIS  [phone]  12/19/2019, 2:23 PM  [place] [address] [place], [place], [address] Phone: [phone]   Fax:  [phone]  Name: MCKENZY BECERRIL MRN: [id number] Date of Birth: 07/15/1956

## 2019-12-20 DIAGNOSIS — E113313 Type 2 diabetes mellitus with moderate nonproliferative diabetic retinopathy with macular edema, bilateral: Secondary | ICD-10-CM | POA: Diagnosis not present

## 2019-12-20 DIAGNOSIS — H35371 Puckering of macula, right eye: Secondary | ICD-10-CM | POA: Diagnosis not present

## 2019-12-20 DIAGNOSIS — H2512 Age-related nuclear cataract, left eye: Secondary | ICD-10-CM | POA: Diagnosis not present

## 2019-12-20 DIAGNOSIS — Z961 Presence of intraocular lens: Secondary | ICD-10-CM | POA: Diagnosis not present

## 2019-12-24 ENCOUNTER — Other Ambulatory Visit: Payer: Self-pay

## 2019-12-24 ENCOUNTER — Encounter (HOSPITAL_COMMUNITY): Payer: Self-pay | Admitting: Occupational Therapy

## 2019-12-24 ENCOUNTER — Ambulatory Visit (HOSPITAL_COMMUNITY): Payer: BC Managed Care – PPO | Attending: Orthopedic Surgery | Admitting: Occupational Therapy

## 2019-12-24 DIAGNOSIS — R29898 Other symptoms and signs involving the musculoskeletal system: Secondary | ICD-10-CM

## 2019-12-24 DIAGNOSIS — M25612 Stiffness of left shoulder, not elsewhere classified: Secondary | ICD-10-CM | POA: Diagnosis not present

## 2019-12-24 DIAGNOSIS — M25512 Pain in left shoulder: Secondary | ICD-10-CM

## 2019-12-24 NOTE — Therapy (Signed)
[place], [place], [address] Phone: [phone]   Fax:  [phone]  Occupational Therapy Treatment  Patient Details  Name: Jordan Perry MRN: [id number] Date of Birth: [birth year] Referring Provider (OT): Arther Abbott, MD   Encounter Date: 12/24/2019  OT End of Session - 12/24/19 0936    Visit Number  6    Number of Visits  12    Date for OT Re-Evaluation  01/06/20    Authorization Type  BC[employer] commercial    Authorization Time Period  no copay or prior authorization. 30 visit    Authorization - Visit Number  6    Authorization - Number of Visits  30    OT Start Time  0902    OT Stop Time  0941    OT Time Calculation (min)  39 min    Activity Tolerance  Patient tolerated treatment well    Behavior During Therapy  WFL for tasks assessed/performed       Past Medical History:  Diagnosis Date  . Allergic rhinitis, cause unspecified   . Arthritis   . Coronary atherosclerosis of unspecified type of vessel, native or graft   . GERD (gastroesophageal reflux disease)   . Hypothyroidism   . Proteinuria   . Pure hyperglyceridemia   . Type II or unspecified type diabetes mellitus without mention of complication, not stated as uncontrolled   . Unspecified essential hypertension     Past Surgical History:  Procedure Laterality Date  . COLONOSCOPY N/A 10/02/2019   Procedure: COLONOSCOPY;  Surgeon: Jordan Dolin, MD;  [facility];  Service: Endoscopy;  Laterality: N/A;  9:30  . CYSTECTOMY  1989   removed from end of spine  . PACEMAKER IMPLANT N/A 12/13/2018   Procedure: PACEMAKER IMPLANT;  Surgeon: Jordan Lance, MD;  Location: [facility];  Service: Cardiovascular;  Laterality: N/A;  . POLYPECTOMY  10/02/2019   Procedure: POLYPECTOMY;  Surgeon: Jordan Dolin, MD;  [facility];  Service: Endoscopy;;  . Jordan Perry  . Right lens implant      There were no vitals  filed for this visit.  Subjective Assessment - 12/24/19 0858    Subjective   S: I can tell when it's doing to rain.    Currently in Pain?  No/denies         [facility] OT Assessment - 12/24/19 0858      Assessment   Medical Diagnosis  Left shoulder impingement      Precautions   Precautions  ICD/Pacemaker               OT Treatments/Exercises (OP) - 12/24/19 0903      Exercises   Exercises  Shoulder      Shoulder Exercises: Supine   Protraction  PROM;5 reps;AROM;12 reps    Horizontal ABduction  PROM;5 reps;AROM;12 reps    External Rotation  PROM;5 reps;AROM;12 reps    Internal Rotation  PROM;5 reps;AROM;12 reps    Flexion  PROM;5 reps;AROM;12 reps    ABduction  PROM;5 reps;AROM;12 reps;Limitations    ABduction Limitations  to shoulder level. tactile cues for form      Shoulder Exercises: Standing   Protraction  AROM;10 reps    Horizontal ABduction  AROM;10 reps    External Rotation  AROM;10 reps    Internal Rotation  AROM;10 reps    Flexion  AROM;10 reps    ABduction  AROM;10 reps;Limitations  ABduction Limitations  to slightly above shoulder level    Extension  Theraband;10 reps    Theraband Level (Shoulder Extension)  Level 2 (Red)    Row  Theraband;10 reps    Theraband Level (Shoulder Row)  Level 2 (Red)    Retraction  Theraband;10 reps    Theraband Level (Shoulder Retraction)  Level 2 (Red)      Shoulder Exercises: ROM/Strengthening   UBE (Upper Arm Bike)  Level 1 3' forward 3' reverse   pace: 4.0-5.0   Wall Wash  1'    Proximal Shoulder Strengthening, Supine  10X A/ROM no rest breaks    Proximal Shoulder Strengthening, Seated  10X A/ROM no rest breaks    Other ROM/Strengthening Exercises  Proximal shoulder strengthening with washcloth on door; 1' flexion 1' abduction. 1 rest break during abduction      Manual Therapy   Manual Therapy  Myofascial release    Manual therapy comments  Manual therapy completed prior to exercises.     Myofascial Release   Myofascial release and manual stretching completed to left upper arm, trapezius, and scapularis region.                OT Short Term Goals - 11/27/19 1037      OT SHORT TERM GOAL #1   Title  Patient will be educated and independent with her HEP to faciliate her progress in therapy and allow her to return to using her LUE for 75% or more of daily tasks.    Time  6    Period  Weeks    Status  On-going    Target Date  01/06/20      OT SHORT TERM GOAL #2   Title  Patient will increase her LUE strength to 4+/5 in order to lift and management normal weighted household items with less difficulty.    Time  6    Period  Weeks    Status  On-going      OT SHORT TERM GOAL #3   Title  Patient will increase her LUE A/ROM to [facility] in order to donn/doff her jacket with less difficulty while performing external rotation at her left shoulder.    Time  6    Period  Weeks    Status  On-going      OT SHORT TERM GOAL #4   Title  Patient will decrease LUE fascial restrictions to min amount or less in order to increase the functional mobility needed to complete pushing and pulling actions.    Time  6    Period  Weeks    Status  On-going      OT SHORT TERM GOAL #5   Title  Patient will report a decrease in pain level of approximately 4/10 or less when completing functional reaching tasks.    Time  6    Period  Weeks    Status  On-going               Plan - 12/24/19 [id number]    Clinical Impression Statement  A: Continued with A/ROM supine and standing, pt with more difficulty with abduction in supine compared to standing. Continued with proximal shoulder strengthening on doorway, as well as scapular theraband. Verbal cuing for form and technique. Pt reports mod fatigue at end of session.    Body Structure / Function / Physical Skills  ADL;UE functional use;Fascial restriction;Pain;ROM;Strength    Plan  P: Continue with A/ROM, work on improving independence in form during  scapular theraband  retraction, update HEP for A/ROM. Add overhead lacing    OT Home Exercise Plan  2/23: AA/ROM shoulder exercises standing       Patient will benefit from skilled therapeutic intervention in order to improve the following deficits and impairments:   Body Structure / Function / Physical Skills: ADL, UE functional use, Fascial restriction, Pain, ROM, Strength       Visit Diagnosis: Stiffness of left shoulder, not elsewhere classified  Acute pain of left shoulder  Other symptoms and signs involving the musculoskeletal system    Problem List Patient Active Problem List   Diagnosis Date Noted  . Complete heart block ([place]) 12/12/2018  . GERD (gastroesophageal reflux disease) 10/10/2012  . Coronary artery disease 10/06/2011  . Obesity 10/06/2011  . HYPERLIPIDEMIA TYPE I / IV 06/26/2009  . LEG PAIN 06/26/2009  . DIABETES MELLITUS, TYPE II 06/22/2009  . HYPERTENSION, UNSPECIFIED 06/22/2009  . ALLERGIC RHINITIS 06/22/2009   Guadelupe Sabin, OTR/L  [phone] 12/24/2019, 9:44 AM  [place] [address] [place], [place], [address] Phone: [phone]   Fax:  [phone]  Name: LTANYA PARRIE MRN: [id number] Date of Birth: [birth year]

## 2019-12-26 ENCOUNTER — Ambulatory Visit (HOSPITAL_COMMUNITY): Payer: BC Managed Care – PPO

## 2019-12-26 DIAGNOSIS — M5033 Other cervical disc degeneration, cervicothoracic region: Secondary | ICD-10-CM | POA: Diagnosis not present

## 2019-12-26 DIAGNOSIS — M9901 Segmental and somatic dysfunction of cervical region: Secondary | ICD-10-CM | POA: Diagnosis not present

## 2019-12-26 DIAGNOSIS — H259 Unspecified age-related cataract: Secondary | ICD-10-CM | POA: Diagnosis not present

## 2019-12-26 DIAGNOSIS — Z961 Presence of intraocular lens: Secondary | ICD-10-CM | POA: Diagnosis not present

## 2019-12-27 ENCOUNTER — Ambulatory Visit (HOSPITAL_COMMUNITY): Payer: BC Managed Care – PPO

## 2019-12-27 ENCOUNTER — Encounter (HOSPITAL_COMMUNITY): Payer: Self-pay

## 2019-12-27 ENCOUNTER — Other Ambulatory Visit: Payer: Self-pay

## 2019-12-27 DIAGNOSIS — M25512 Pain in left shoulder: Secondary | ICD-10-CM

## 2019-12-27 DIAGNOSIS — M25612 Stiffness of left shoulder, not elsewhere classified: Secondary | ICD-10-CM

## 2019-12-27 DIAGNOSIS — R29898 Other symptoms and signs involving the musculoskeletal system: Secondary | ICD-10-CM

## 2019-12-27 NOTE — Therapy (Signed)
[place], [place], [address] Phone: [phone]   Fax:  [phone]  Occupational Therapy Treatment  Patient Details  Name: Jordan Perry MRN: [id number] Date of Birth: 08/12/1956 Referring Provider (OT): Arther Abbott, MD   Encounter Date: 12/27/2019  OT End of Session - 12/27/19 0936    Visit Number  7    Number of Visits  12    Date for OT Re-Evaluation  01/06/20    Authorization Type  [employer] commercial    Authorization Time Period  no copay or prior authorization. 30 visit    Authorization - Visit Number  7    Authorization - Number of Visits  30    OT Start Time  [phone]    OT Stop Time  0940    OT Time Calculation (min)  36 min    Activity Tolerance  Patient tolerated treatment well    Behavior During Therapy  WFL for tasks assessed/performed       Past Medical History:  Diagnosis Date  . Allergic rhinitis, cause unspecified   . Arthritis   . Coronary atherosclerosis of unspecified type of vessel, native or graft   . GERD (gastroesophageal reflux disease)   . Hypothyroidism   . Proteinuria   . Pure hyperglyceridemia   . Type II or unspecified type diabetes mellitus without mention of complication, not stated as uncontrolled   . Unspecified essential hypertension     Past Surgical History:  Procedure Laterality Date  . COLONOSCOPY N/A 10/02/2019   Procedure: COLONOSCOPY;  Surgeon: Daneil Dolin, MD;  [facility];  Service: Endoscopy;  Laterality: N/A;  9:30  . CYSTECTOMY  1989   removed from end of spine  . PACEMAKER IMPLANT N/A 12/13/2018   Procedure: PACEMAKER IMPLANT;  Surgeon: Evans Lance, MD;  Location: [facility];  Service: Cardiovascular;  Laterality: N/A;  . POLYPECTOMY  10/02/2019   Procedure: POLYPECTOMY;  Surgeon: Daneil Dolin, MD;  [facility];  Service: Endoscopy;;  . [place]  . Right lens implant      There were no vitals  filed for this visit.  Subjective Assessment - 12/27/19 0919    Subjective   S: It's doing ok.    Currently in Pain?  No/denies         [facility] OT Assessment - 12/27/19 0919      Assessment   Medical Diagnosis  Left shoulder impingement      Precautions   Precautions  ICD/Pacemaker               OT Treatments/Exercises (OP) - 12/27/19 0920      Exercises   Exercises  Shoulder      Shoulder Exercises: Supine   Protraction  PROM;5 reps;AROM;12 reps    Horizontal ABduction  PROM;5 reps;AROM;12 reps    External Rotation  PROM;5 reps;AROM;12 reps    Internal Rotation  PROM;5 reps;AROM;12 reps    Flexion  PROM;5 reps;AROM;12 reps    ABduction  PROM;5 reps;AROM;12 reps;Limitations    ABduction Limitations  to shoulder level. tactile cues for form      Shoulder Exercises: Standing   Protraction  AROM;10 reps    Horizontal ABduction  AROM;10 reps    External Rotation  AROM;10 reps    Internal Rotation  AROM;10 reps    Flexion  AROM;10 reps    ABduction  AROM;10 reps;Limitations    ABduction Limitations  almost full range.    Extension  Theraband;12 reps    Theraband Level (Shoulder Extension)  Level 2 (Red)    Retraction  Theraband;12 reps    Theraband Level (Shoulder Retraction)  Level 2 (Red)      Shoulder Exercises: ROM/Strengthening   Over Head Lace  1' seated               OT Short Term Goals - 11/27/19 1037      OT SHORT TERM GOAL #1   Title  Patient will be educated and independent with her HEP to faciliate her progress in therapy and allow her to return to using her LUE for 75% or more of daily tasks.    Time  6    Period  Weeks    Status  On-going    Target Date  01/06/20      OT SHORT TERM GOAL #2   Title  Patient will increase her LUE strength to 4+/5 in order to lift and management normal weighted household items with less difficulty.    Time  6    Period  Weeks    Status  On-going      OT SHORT TERM GOAL #3   Title  Patient will  increase her LUE A/ROM to [facility] in order to donn/doff her jacket with less difficulty while performing external rotation at her left shoulder.    Time  6    Period  Weeks    Status  On-going      OT SHORT TERM GOAL #4   Title  Patient will decrease LUE fascial restrictions to min amount or less in order to increase the functional mobility needed to complete pushing and pulling actions.    Time  6    Period  Weeks    Status  On-going      OT SHORT TERM GOAL #5   Title  Patient will report a decrease in pain level of approximately 4/10 or less when completing functional reaching tasks.    Time  6    Period  Weeks    Status  On-going               Plan - 12/27/19 0941    Clinical Impression Statement  A: Patient is completing A/ROM at home for HEP. Continued with A/ROM with verbal cues and modifications for comfort level as needed. Patient continues to experience pain with certain movements and stretches. More muscle and movement guarding during session. Patient was able to demonstrate proper form and technique with scapular retraction using the band when provided with verbal and tactile cues on first repetition.    Body Structure / Function / Physical Skills  ADL;UE functional use;Fascial restriction;Pain;ROM;Strength    Plan  P: Add shoulder stretches and provide to HEP as needed.    Consulted and Agree with Plan of Care  Patient       Patient will benefit from skilled therapeutic intervention in order to improve the following deficits and impairments:   Body Structure / Function / Physical Skills: ADL, UE functional use, Fascial restriction, Pain, ROM, Strength       Visit Diagnosis: Other symptoms and signs involving the musculoskeletal system  Acute pain of left shoulder  Stiffness of left shoulder, not elsewhere classified    Problem List Patient Active Problem List   Diagnosis Date Noted  . Complete heart block ([place]) 12/12/2018  . GERD (gastroesophageal reflux  disease) 10/10/2012  . Coronary artery disease 10/06/2011  .  Obesity 10/06/2011  . HYPERLIPIDEMIA TYPE I / IV 06/26/2009  . LEG PAIN 06/26/2009  . DIABETES MELLITUS, TYPE II 06/22/2009  . HYPERTENSION, UNSPECIFIED 06/22/2009  . ALLERGIC RHINITIS 06/22/2009   Ailene Ravel, OTR/L,CBIS  [phone]  12/27/2019, 9:44 AM  [place] [address] [place], [place], [address] Phone: [phone]   Fax:  [phone]  Name: Jordan Perry MRN: [id number] Date of Birth: 02/12/1956

## 2019-12-31 ENCOUNTER — Ambulatory Visit (HOSPITAL_COMMUNITY): Payer: BC Managed Care – PPO

## 2019-12-31 ENCOUNTER — Encounter (HOSPITAL_COMMUNITY): Payer: Self-pay

## 2019-12-31 ENCOUNTER — Other Ambulatory Visit: Payer: Self-pay

## 2019-12-31 DIAGNOSIS — M25612 Stiffness of left shoulder, not elsewhere classified: Secondary | ICD-10-CM

## 2019-12-31 DIAGNOSIS — R29898 Other symptoms and signs involving the musculoskeletal system: Secondary | ICD-10-CM

## 2019-12-31 DIAGNOSIS — M25512 Pain in left shoulder: Secondary | ICD-10-CM

## 2019-12-31 NOTE — Patient Instructions (Signed)
Complete the following exercises 2-3 times a day.  Doorway Stretch  Place each hand opposite each other on the doorway. (You can change where you feel the stretch by moving arms higher or lower.) Step through with one foot and bend front knee until a stretch is felt and hold. Step through with the opposite foot on the next rep. Hold for __10-15___ seconds. Repeat __2__times.      Internal Rotation Across Back  Grab the end of a towel with your affected side, palm facing backwards. Grab the towel with your unaffected side and pull your affected hand across your back until you feel a stretch in the front of your shoulder. If you feel pain, pull just to the pain, do not pull through the pain. Hold. Return your affected arm to your side. Try to keep your hand/arm close to your body during the entire movement.     Hold for 10-15 seconds. Complete 2 times.        Posterior Capsule Stretch   Stand or sit, one arm across body so hand rests over opposite shoulder. Gently push on crossed elbow with other hand until stretch is felt in shoulder of crossed arm. Hold _10-15__ seconds.  Repeat _2__ times per session. Do ___ sessions per day.   Wall Flexion  Slide your arm up the wall or door frame until a stretch is felt in your shoulder . Hold for 10-15 seconds. Complete 2 times     Shoulder Abduction Stretch  Stand side ways by a wall with affected up on wall. Gently step in toward wall to feel stretch. Hold for 10-15 seconds. Complete 2 times.

## 2019-12-31 NOTE — Therapy (Signed)
[place], [place], [address] Phone: [phone]   Fax:  [phone]  Occupational Therapy Treatment  Patient Details  Name: Jordan Perry MRN: [id number] Date of Birth: Aug 04, 1956 Referring Provider (OT): Arther Abbott, MD   Encounter Date: 12/31/2019  OT End of Session - 12/31/19 0936    Visit Number  8    Number of Visits  12    Date for OT Re-Evaluation  01/06/20    Authorization Type  [employer] commercial    Authorization Time Period  no copay or prior authorization. 30 visit    Authorization - Visit Number  8    Authorization - Number of Visits  30    OT Start Time  0900    OT Stop Time  [phone]    OT Time Calculation (min)  38 min    Activity Tolerance  Patient tolerated treatment well    Behavior During Therapy  WFL for tasks assessed/performed       Past Medical History:  Diagnosis Date  . Allergic rhinitis, cause unspecified   . Arthritis   . Coronary atherosclerosis of unspecified type of vessel, native or graft   . GERD (gastroesophageal reflux disease)   . Hypothyroidism   . Proteinuria   . Pure hyperglyceridemia   . Type II or unspecified type diabetes mellitus without mention of complication, not stated as uncontrolled   . Unspecified essential hypertension     Past Surgical History:  Procedure Laterality Date  . COLONOSCOPY N/A 10/02/2019   Procedure: COLONOSCOPY;  Surgeon: Daneil Dolin, MD;  [facility];  Service: Endoscopy;  Laterality: N/A;  9:30  . CYSTECTOMY  1989   removed from end of spine  . PACEMAKER IMPLANT N/A 12/13/2018   Procedure: PACEMAKER IMPLANT;  Surgeon: Evans Lance, MD;  Location: [facility];  Service: Cardiovascular;  Laterality: N/A;  . POLYPECTOMY  10/02/2019   Procedure: POLYPECTOMY;  Surgeon: Daneil Dolin, MD;  [facility];  Service: Endoscopy;;  . [place]  . Right lens implant      There were no vitals  filed for this visit.  Subjective Assessment - 12/31/19 0919    Subjective   S: It feels tight.    Currently in Pain?  No/denies         [facility] OT Assessment - 12/31/19 0920      Assessment   Medical Diagnosis  Left shoulder impingement      Precautions   Precautions  ICD/Pacemaker               OT Treatments/Exercises (OP) - 12/31/19 0920      Exercises   Exercises  Shoulder      Shoulder Exercises: Supine   Protraction  PROM;5 reps;AROM;15 reps    Horizontal ABduction  PROM;5 reps;AROM;15 reps    External Rotation  PROM;5 reps;AROM;15 reps    Internal Rotation  PROM;5 reps;AROM;15 reps    Flexion  PROM;5 reps;AROM;15 reps    ABduction  PROM;5 reps;AROM;10 reps    ABduction Limitations  to shoulder level. tactile cues for form      Shoulder Exercises: Standing   Flexion  AROM;10 reps    ABduction  AROM;10 reps;Limitations    ABduction Limitations  shoulder level      Shoulder Exercises: ROM/Strengthening   Over Head Lace  2' seated    Proximal Shoulder Strengthening, Supine  12X A/ROM no rest breaks  Ball on Wall  1' flexion 1' abduction green ball      Shoulder Exercises: Stretch   Corner Stretch  2 reps;10 seconds   doorway            OT Education - 12/31/19 0936    Education Details  shoulder stretches    Person(s) Educated  Patient    Methods  Explanation;Demonstration;Handout;Verbal cues    Comprehension  Returned demonstration;Verbalized understanding       OT Short Term Goals - 11/27/19 1037      OT SHORT TERM GOAL #1   Title  Patient will be educated and independent with her HEP to faciliate her progress in therapy and allow her to return to using her LUE for 75% or more of daily tasks.    Time  6    Period  Weeks    Status  On-going    Target Date  01/06/20      OT SHORT TERM GOAL #2   Title  Patient will increase her LUE strength to 4+/5 in order to lift and management normal weighted household items with less difficulty.     Time  6    Period  Weeks    Status  On-going      OT SHORT TERM GOAL #3   Title  Patient will increase her LUE A/ROM to [facility] in order to donn/doff her jacket with less difficulty while performing external rotation at her left shoulder.    Time  6    Period  Weeks    Status  On-going      OT SHORT TERM GOAL #4   Title  Patient will decrease LUE fascial restrictions to min amount or less in order to increase the functional mobility needed to complete pushing and pulling actions.    Time  6    Period  Weeks    Status  On-going      OT SHORT TERM GOAL #5   Title  Patient will report a decrease in pain level of approximately 4/10 or less when completing functional reaching tasks.    Time  6    Period  Weeks    Status  On-going               Plan - 12/31/19 [id number]    Clinical Impression Statement  A: completed shoulder stretches standing while education was provided for form and technique. Provided stretches for HEP. Continued with A/ROM and functional reaching while adding ball on the wall for shoulder and scapular stability.    Body Structure / Function / Physical Skills  ADL;UE functional use;Fascial restriction;Pain;ROM;Strength    Plan  P: Reassess for possible discharge.    Consulted and Agree with Plan of Care  Patient       Patient will benefit from skilled therapeutic intervention in order to improve the following deficits and impairments:   Body Structure / Function / Physical Skills: ADL, UE functional use, Fascial restriction, Pain, ROM, Strength       Visit Diagnosis: Other symptoms and signs involving the musculoskeletal system  Acute pain of left shoulder  Stiffness of left shoulder, not elsewhere classified    Problem List Patient Active Problem List   Diagnosis Date Noted  . Complete heart block ([place]) 12/12/2018  . GERD (gastroesophageal reflux disease) 10/10/2012  . Coronary artery disease 10/06/2011  . Obesity 10/06/2011  . HYPERLIPIDEMIA  TYPE I / IV 06/26/2009  . LEG PAIN 06/26/2009  . DIABETES MELLITUS, TYPE II  06/22/2009  . HYPERTENSION, UNSPECIFIED 06/22/2009  . ALLERGIC RHINITIS 06/22/2009   Ailene Ravel, OTR/L,CBIS  [phone]  12/31/2019, 10:08 AM  [place] [address] [place], [place], [address] Phone: [phone]   Fax:  [phone]  Name: Jordan Perry MRN: [id number] Date of Birth: 09-04-1956

## 2020-01-02 ENCOUNTER — Other Ambulatory Visit: Payer: Self-pay

## 2020-01-02 ENCOUNTER — Ambulatory Visit (HOSPITAL_COMMUNITY): Payer: BC Managed Care – PPO

## 2020-01-02 ENCOUNTER — Encounter (HOSPITAL_COMMUNITY): Payer: Self-pay

## 2020-01-02 DIAGNOSIS — R29898 Other symptoms and signs involving the musculoskeletal system: Secondary | ICD-10-CM

## 2020-01-02 DIAGNOSIS — M25512 Pain in left shoulder: Secondary | ICD-10-CM | POA: Diagnosis not present

## 2020-01-02 DIAGNOSIS — M25612 Stiffness of left shoulder, not elsewhere classified: Secondary | ICD-10-CM | POA: Diagnosis not present

## 2020-01-02 NOTE — Therapy (Signed)
[place], [place], [address] Phone: [phone]   Fax:  [phone]  Occupational Therapy Treatment  Patient Details  Name: Jordan Perry MRN: [id number] Date of Birth: 1956-01-13 Referring Provider (OT): Arther Abbott, MD   Encounter Date: 01/02/2020  OT End of Session - 01/02/20 0947    Visit Number  9    Number of Visits  12    Authorization Type  [employer] commercial    Authorization Time Period  no copay or prior authorization. 30 visit    Authorization - Visit Number  9    Authorization - Number of Visits  30    OT Start Time  0900   reassess and discharge   OT Stop Time  [phone]    OT Time Calculation (min)  38 min    Activity Tolerance  Patient tolerated treatment well    Behavior During Therapy  WFL for tasks assessed/performed       Past Medical History:  Diagnosis Date  . Allergic rhinitis, cause unspecified   . Arthritis   . Coronary atherosclerosis of unspecified type of vessel, native or graft   . GERD (gastroesophageal reflux disease)   . Hypothyroidism   . Proteinuria   . Pure hyperglyceridemia   . Type II or unspecified type diabetes mellitus without mention of complication, not stated as uncontrolled   . Unspecified essential hypertension     Past Surgical History:  Procedure Laterality Date  . COLONOSCOPY N/A 10/02/2019   Procedure: COLONOSCOPY;  Surgeon: Daneil Dolin, MD;  [facility];  Service: Endoscopy;  Laterality: N/A;  9:30  . CYSTECTOMY  1989   removed from end of spine  . PACEMAKER IMPLANT N/A 12/13/2018   Procedure: PACEMAKER IMPLANT;  Surgeon: Evans Lance, MD;  Location: [facility];  Service: Cardiovascular;  Laterality: N/A;  . POLYPECTOMY  10/02/2019   Procedure: POLYPECTOMY;  Surgeon: Daneil Dolin, MD;  [facility];  Service: Endoscopy;;  . [place]  . Right lens implant      There were no vitals filed for  this visit.  Subjective Assessment - 01/02/20 0907    Subjective   S: It only hurts with certain movements.    Currently in Pain?  No/denies         [facility] OT Assessment - 01/02/20 0908      Assessment   Medical Diagnosis  Left shoulder impingement      Precautions   Precautions  ICD/Pacemaker      Observation/Other Assessments   Focus on Therapeutic Outcomes (FOTO)   68/100      ROM / Strength   AROM / PROM / Strength  AROM;PROM;Strength      AROM   Overall AROM Comments  Assessed seated. IR/er adducted    AROM Assessment Site  Shoulder    Right/Left Shoulder  Left    Left Shoulder Flexion  141 Degrees   previous: 118   Left Shoulder ABduction  131 Degrees   previous: 95   Left Shoulder Internal Rotation  90 Degrees   previous: same   Left Shoulder External Rotation  60 Degrees   previous: 55     PROM   Overall PROM Comments  Assessed supine. IR/er adducted    PROM Assessment Site  Shoulder    Right/Left Shoulder  Left    Left Shoulder Flexion  160 Degrees   previous: 126   Left  Shoulder ABduction  123 Degrees   previous: 98   Left Shoulder Internal Rotation  90 Degrees   previous: 90   Left Shoulder External Rotation  60 Degrees   previous: same     Strength   Overall Strength Comments  Assessed seated. IR/er adducted    Strength Assessment Site  Shoulder    Right/Left Shoulder  Left    Left Shoulder Flexion  4+/5   previous; 4/5   Left Shoulder ABduction  4-/5   previous: 3-/5   Left Shoulder Internal Rotation  5/5   previous: 4/5   Left Shoulder External Rotation  4+/5   previous: 4+/5              OT Treatments/Exercises (OP) - 01/02/20 0908      Exercises   Exercises  Shoulder      Shoulder Exercises: Supine   Protraction  PROM;5 reps    Horizontal ABduction  PROM;5 reps    External Rotation  PROM;5 reps    Internal Rotation  PROM;5 reps    Flexion  PROM;5 reps    ABduction  PROM;5 reps      Shoulder Exercises: Stretch    Corner Stretch  2 reps;10 seconds   doorway   Cross Chest Stretch  2 reps;10 seconds    Internal Rotation Stretch  2 reps   10X; towel horizontal   Wall Stretch - Flexion  2 reps;10 seconds    Wall Stretch - ABduction  2 reps;10 seconds             OT Education - 01/02/20 0946    Education Details  reviewed HEP. Pt is to continue with shoulder A/ROM exercises and shoulder stretches. Reviewed progress in therapy. Reviewed goals.    Person(s) Educated  Patient    Methods  Explanation;Demonstration;Verbal cues    Comprehension  Returned demonstration;Verbalized understanding       OT Short Term Goals - 01/02/20 0923      OT SHORT TERM GOAL #1   Title  Patient will be educated and independent with her HEP to faciliate her progress in therapy and allow her to return to using her LUE for 75% or more of daily tasks.    Time  6    Period  Weeks    Status  Achieved    Target Date  01/06/20      OT SHORT TERM GOAL #2   Title  Patient will increase her LUE strength to 4+/5 in order to lift and management normal weighted household items with less difficulty.    Time  6    Period  Weeks    Status  Achieved      OT SHORT TERM GOAL #3   Title  Patient will increase her LUE A/ROM to [facility] in order to donn/doff her jacket with less difficulty while performing external rotation at her left shoulder.    Time  6    Period  Weeks    Status  Achieved      OT SHORT TERM GOAL #4   Title  Patient will decrease LUE fascial restrictions to min amount or less in order to increase the functional mobility needed to complete pushing and pulling actions.    Time  6    Period  Weeks    Status  Achieved      OT SHORT TERM GOAL #5   Title  Patient will report a decrease in pain level of approximately 4/10 or less when  completing functional reaching tasks.    Time  6    Period  Weeks    Status  Achieved               Plan - 01/02/20 0947    Clinical Impression Statement  A: Reassessment  completed this date. patient has met all therapy goals. Pt reports that she is able to identify improvement during she started therapy. She continues to have pain and discomfort when performing external rotation. Pain is felt during the end range stretch. Discussed HEP and reviewed. Recommended that patient continue with shoulder stretches and A/ROM. Pt verbalized understanding and is agreeable with recommendations.    Body Structure / Function / Physical Skills  ADL;UE functional use;Fascial restriction;Pain;ROM;Strength    Plan  P: Discharge from OT services with HEP.    Consulted and Agree with Plan of Care  Patient       Patient will benefit from skilled therapeutic intervention in order to improve the following deficits and impairments:   Body Structure / Function / Physical Skills: ADL, UE functional use, Fascial restriction, Pain, ROM, Strength       Visit Diagnosis: Acute pain of left shoulder  Stiffness of left shoulder, not elsewhere classified  Other symptoms and signs involving the musculoskeletal system    Problem List Patient Active Problem List   Diagnosis Date Noted  . Complete heart block ([place]) 12/12/2018  . GERD (gastroesophageal reflux disease) 10/10/2012  . Coronary artery disease 10/06/2011  . Obesity 10/06/2011  . HYPERLIPIDEMIA TYPE I / IV 06/26/2009  . LEG PAIN 06/26/2009  . DIABETES MELLITUS, TYPE II 06/22/2009  . HYPERTENSION, UNSPECIFIED 06/22/2009  . ALLERGIC RHINITIS 06/22/2009     OCCUPATIONAL THERAPY DISCHARGE SUMMARY  Visits from Start of Care: 9  Current functional level related to goals / functional outcomes: See above   Remaining deficits: See above   Education / Equipment: See above Plan: Patient agrees to discharge.  Patient goals were met. Patient is being discharged due to meeting the stated rehab goals.  ?????           Ailene Ravel, OTR/L,CBIS  [phone]  01/02/2020, 10:35 AM  [place], [place], [address] Phone: [phone]   Fax:  [phone]  Name: Jordan Perry MRN: [id number] Date of Birth: 09-20-1956

## 2020-01-09 DIAGNOSIS — M9901 Segmental and somatic dysfunction of cervical region: Secondary | ICD-10-CM | POA: Diagnosis not present

## 2020-01-09 DIAGNOSIS — M5033 Other cervical disc degeneration, cervicothoracic region: Secondary | ICD-10-CM | POA: Diagnosis not present

## 2020-01-16 DIAGNOSIS — M9901 Segmental and somatic dysfunction of cervical region: Secondary | ICD-10-CM | POA: Diagnosis not present

## 2020-01-16 DIAGNOSIS — M5033 Other cervical disc degeneration, cervicothoracic region: Secondary | ICD-10-CM | POA: Diagnosis not present

## 2020-01-20 DIAGNOSIS — E113313 Type 2 diabetes mellitus with moderate nonproliferative diabetic retinopathy with macular edema, bilateral: Secondary | ICD-10-CM | POA: Diagnosis not present

## 2020-01-20 DIAGNOSIS — H35371 Puckering of macula, right eye: Secondary | ICD-10-CM | POA: Diagnosis not present

## 2020-01-20 DIAGNOSIS — H2512 Age-related nuclear cataract, left eye: Secondary | ICD-10-CM | POA: Diagnosis not present

## 2020-01-23 DIAGNOSIS — E7800 Pure hypercholesterolemia, unspecified: Secondary | ICD-10-CM | POA: Diagnosis not present

## 2020-01-23 DIAGNOSIS — E1165 Type 2 diabetes mellitus with hyperglycemia: Secondary | ICD-10-CM | POA: Diagnosis not present

## 2020-01-23 DIAGNOSIS — R809 Proteinuria, unspecified: Secondary | ICD-10-CM | POA: Diagnosis not present

## 2020-01-23 DIAGNOSIS — E039 Hypothyroidism, unspecified: Secondary | ICD-10-CM | POA: Diagnosis not present

## 2020-01-28 ENCOUNTER — Other Ambulatory Visit: Payer: Self-pay

## 2020-01-28 ENCOUNTER — Encounter: Payer: Self-pay | Admitting: Internal Medicine

## 2020-01-28 ENCOUNTER — Ambulatory Visit (INDEPENDENT_AMBULATORY_CARE_PROVIDER_SITE_OTHER): Payer: BC Managed Care – PPO | Admitting: Internal Medicine

## 2020-01-28 VITALS — BP 112/66 | HR 98 | Temp 98.5°F | Ht 64.0 in | Wt 262.0 lb

## 2020-01-28 DIAGNOSIS — I442 Atrioventricular block, complete: Secondary | ICD-10-CM

## 2020-01-28 NOTE — Progress Notes (Signed)
HPI [name] returns today for PPM followup. She is a pleasant 64 yo woman with CHB, s/p PPM insertion just over a year ago. The patient has done well in the interim. She is frustrated by her inability to lose weight. She denies chest pain, sob or syncope. No edema.  Allergies  Allergen Reactions  . Invokana [Canagliflozin] Other (See Comments)    Skin peeling  . Janumet [Sitagliptin-Metformin Hcl] Other (See Comments)    Skin peeling  . Cephalexin Other (See Comments)  . Penicillins     Has patient had a PCN reaction causing immediate rash, facial/tongue/throat swelling, SOB or lightheadedness with hypotension: Yes-immediate rash Has patient had a PCN reaction causing severe rash involving mucus membranes or skin necrosis: Yes Has patient had a PCN reaction that required hospitalization: No Has patient had a PCN reaction occurring within the last 10 years: No If all of the above answers are "NO", then may proceed with Cephalosporin use.   . Prednisone Rash     Current Outpatient Medications  Medication Sig Dispense Refill  . acetaminophen (TYLENOL) 500 MG tablet Take 1,000-1,500 mg by mouth as needed (for headaches.).     Marland Kitchen aspirin EC 81 MG tablet Take 81 mg by mouth daily.    Marland Kitchen atorvastatin (LIPITOR) 80 MG tablet Take 1 tablet (80 mg total) by mouth at bedtime. 90 tablet 3  . furosemide (LASIX) 20 MG tablet TAKE 1 TABLET AS NEEDED (Patient taking differently: Take 20 mg by mouth daily as needed for fluid or edema. ) 90 tablet 1  . HUMULIN N KWIKPEN 100 UNIT/ML Kiwkpen Inject 40-60 Units into the skin See admin instructions. Inject 60 units subcutaneously in the morning & 40 units subcutaneously at night.    . insulin aspart (NOVOLOG) 100 UNIT/ML injection Inject 20-40 Units into the skin 3 (three) times daily before meals. Sliding Scale Insulin    . lisinopril (ZESTRIL) 20 MG tablet Take 1 tablet (20 mg total) by mouth daily. 90 tablet 3  . meloxicam (MOBIC) 7.5 MG tablet  Take 1 tablet (7.5 mg total) by mouth daily. 30 tablet 5  . metFORMIN (GLUCOPHAGE-XR) 500 MG 24 hr tablet Take 500 mg by mouth 2 (two) times daily after a meal.     . Multiple Vitamin (MULTIVITAMIN WITH MINERALS) TABS tablet Take 1 tablet by mouth daily. Centrum Silver Multivitamin    . Probiotic Product (ALIGN) 4 MG CAPS Take 1 capsule by mouth daily.    Marland Kitchen SYNTHROID 125 MCG tablet Take 125 mcg by mouth daily before breakfast.      No current facility-administered medications for this visit.     Past Medical History:  Diagnosis Date  . Allergic rhinitis, cause unspecified   . Arthritis   . Coronary atherosclerosis of unspecified type of vessel, native or graft   . GERD (gastroesophageal reflux disease)   . Hypothyroidism   . Proteinuria   . Pure hyperglyceridemia   . Type II or unspecified type diabetes mellitus without mention of complication, not stated as uncontrolled   . Unspecified essential hypertension     ROS:   All systems reviewed and negative except as noted in the HPI.   Past Surgical History:  Procedure Laterality Date  . COLONOSCOPY N/A 10/02/2019   Procedure: COLONOSCOPY;  Surgeon: Daneil Dolin, MD;  [facility];  Service: Endoscopy;  Laterality: N/A;  9:30  . CYSTECTOMY  1989   removed from end of spine  . PACEMAKER  IMPLANT N/A 12/13/2018   Procedure: PACEMAKER IMPLANT;  Surgeon: Evans Lance, MD;  Location: [facility];  Service: Cardiovascular;  Laterality: N/A;  . POLYPECTOMY  10/02/2019   Procedure: POLYPECTOMY;  Surgeon: Daneil Dolin, MD;  [facility];  Service: Endoscopy;;  . [place]  . Right lens implant       Family History  Problem Relation Age of Onset  . COPD Mother 60  . Heart disease Father   . Cancer Father 64  . Coronary artery disease Other   . Diabetes Other   . Obesity Other   . Heart attack Brother 29  . Coronary artery disease Sister   . Hyperlipidemia Other   . Hypertension  Other   . Colon cancer Neg Hx      Social History   Socioeconomic History  . Marital status: Married    Spouse name: Not on file  . Number of children: 0  . Years of education: Not on file  . Highest education level: Not on file  Occupational History  . Occupation: Producer, 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[employer]    Comment: [place], 13+ years  Tobacco Use  . Smoking status: Never Smoker  . Smokeless tobacco: Never Used  Substance and Sexual Activity  . Alcohol use: No    Alcohol/week: 0.0 standard drinks  . Drug use: No  . Sexual activity: Not on file  Other Topics Concern  . Not on file  Social History Narrative  . Not on file   Social Determinants of Health   Financial Resource Strain:   . Difficulty of Paying Living Expenses:   Food Insecurity:   . Worried About Charity fundraiser in the Last Year:   . Arboriculturist in the Last Year:   Transportation Needs:   . Film/video editor (Medical):   Marland Kitchen Lack of Transportation (Non-Medical):   Physical Activity:   . Days of Exercise per Week:   . Minutes of Exercise per Session:   Stress:   . Feeling of Stress :   Social Connections:   . Frequency of Communication with Friends and Family:   . Frequency of Social Gatherings with Friends and Family:   . Attends Religious Services:   . Active Member of Clubs or Organizations:   . Attends Archivist Meetings:   Marland Kitchen Marital Status:   Intimate Partner Violence:   . Fear of Current or Ex-Partner:   . Emotionally Abused:   Marland Kitchen Physically Abused:   . Sexually Abused:      BP 112/66   Pulse 98   Temp 98.5 F (36.9 C)   Ht 5\' 4"  (1.626 m)   Wt 262 lb (118.8 kg)   SpO2 96%   BMI 44.97 kg/m   Physical Exam:  overweight appearing NAD HEENT: Unremarkable Neck:  No JVD, no thyromegally Lymphatics:  No adenopathy Back:  No CVA tenderness Lungs:  Clear with no wheezes HEART:  Regular rate rhythm, no murmurs, no rubs, no clicks Abd:  soft, positive bowel  sounds, no organomegally, no rebound, no guarding Ext:  2 plus pulses, no edema, no cyanosis, no clubbing Skin:  No rashes no nodules Neuro:  CN II through XII intact, motor grossly intact  DEVICE  Normal device function.  See PaceArt for details.   Assess/Plan: 1. CHB - she is conducting 2:1 today after her PM was turned down. She is asymptomatic, s/p PPM insertion. 2.  Obesity - her weight is up and I have encouraged her to lose weight, and increase her activity. 3. PPM - her St[employer] Jude DDD PM is working normally. We will recheck in several months.  Mikle Bosworth.D.

## 2020-01-28 NOTE — Patient Instructions (Signed)
Medication Instructions:  Your physician recommends that you continue on your current medications as directed. Please refer to the Current Medication list given to you today.  *If you need a refill on your cardiac medications before your next appointment, please call your pharmacy*   Lab Work: NONE   If you have labs (blood work) drawn today and your tests are completely normal, you will receive your results only by: . MyChart Message (if you have MyChart) OR . A paper copy in the mail If you have any lab test that is abnormal or we need to change your treatment, we will call you to review the results.   Testing/Procedures: NONE    Follow-Up: At [facility], you and your health needs are our priority.  As part of our continuing mission to provide you with exceptional heart care, we have created designated Provider Care Teams.  These Care Teams include your primary Cardiologist (physician) and Advanced Practice Providers (APPs -  Physician Assistants and Nurse Practitioners) who all work together to provide you with the care you need, when you need it.  We recommend signing up for the patient portal called "MyChart".  Sign up information is provided on this After Visit Summary.  MyChart is used to connect with patients for Virtual Visits (Telemedicine).  Patients are able to view lab/test results, encounter notes, upcoming appointments, etc.  Non-urgent messages can be sent to your provider as well.   To learn more about what you can do with MyChart, go to [email or web address].    Your next appointment:   1 year(s)  The format for your next appointment:   In Person  Provider:   Gregg Taylor, MD   Other Instructions Thank you for choosing [facility]!    

## 2020-01-29 DIAGNOSIS — H259 Unspecified age-related cataract: Secondary | ICD-10-CM | POA: Diagnosis not present

## 2020-01-29 DIAGNOSIS — Z961 Presence of intraocular lens: Secondary | ICD-10-CM | POA: Diagnosis not present

## 2020-01-30 DIAGNOSIS — I10 Essential (primary) hypertension: Secondary | ICD-10-CM | POA: Diagnosis not present

## 2020-01-30 DIAGNOSIS — E039 Hypothyroidism, unspecified: Secondary | ICD-10-CM | POA: Diagnosis not present

## 2020-01-30 DIAGNOSIS — R809 Proteinuria, unspecified: Secondary | ICD-10-CM | POA: Diagnosis not present

## 2020-01-30 DIAGNOSIS — E1165 Type 2 diabetes mellitus with hyperglycemia: Secondary | ICD-10-CM | POA: Diagnosis not present

## 2020-02-05 DIAGNOSIS — M9901 Segmental and somatic dysfunction of cervical region: Secondary | ICD-10-CM | POA: Diagnosis not present

## 2020-02-05 DIAGNOSIS — M5033 Other cervical disc degeneration, cervicothoracic region: Secondary | ICD-10-CM | POA: Diagnosis not present

## 2020-02-17 DIAGNOSIS — Z961 Presence of intraocular lens: Secondary | ICD-10-CM | POA: Diagnosis not present

## 2020-02-17 DIAGNOSIS — H35371 Puckering of macula, right eye: Secondary | ICD-10-CM | POA: Diagnosis not present

## 2020-02-17 DIAGNOSIS — E113313 Type 2 diabetes mellitus with moderate nonproliferative diabetic retinopathy with macular edema, bilateral: Secondary | ICD-10-CM | POA: Diagnosis not present

## 2020-02-20 DIAGNOSIS — M9901 Segmental and somatic dysfunction of cervical region: Secondary | ICD-10-CM | POA: Diagnosis not present

## 2020-02-20 DIAGNOSIS — M5033 Other cervical disc degeneration, cervicothoracic region: Secondary | ICD-10-CM | POA: Diagnosis not present

## 2020-02-27 DIAGNOSIS — M9901 Segmental and somatic dysfunction of cervical region: Secondary | ICD-10-CM | POA: Diagnosis not present

## 2020-02-27 DIAGNOSIS — M5033 Other cervical disc degeneration, cervicothoracic region: Secondary | ICD-10-CM | POA: Diagnosis not present

## 2020-03-18 ENCOUNTER — Ambulatory Visit (INDEPENDENT_AMBULATORY_CARE_PROVIDER_SITE_OTHER): Payer: BC Managed Care – PPO | Admitting: *Deleted

## 2020-03-18 DIAGNOSIS — I442 Atrioventricular block, complete: Secondary | ICD-10-CM

## 2020-03-18 LAB — CUP PACEART REMOTE DEVICE CHECK
Battery Remaining Longevity: 133 mo
Battery Remaining Percentage: 95.5 %
Battery Voltage: 3.01 V
Brady Statistic AP VP Percent: 1 %
Brady Statistic AP VS Percent: 1.6 %
Brady Statistic AS VP Percent: 15 %
Brady Statistic AS VS Percent: 83 %
Brady Statistic RA Percent Paced: 1.5 %
Brady Statistic RV Percent Paced: 15 %
Date Time Interrogation Session: 20210526020013
Implantable Lead Implant Date: 20200220
Implantable Lead Implant Date: 20200220
Implantable Lead Location: 753859
Implantable Lead Location: 753860
Implantable Pulse Generator Implant Date: 20200220
Lead Channel Impedance Value: 450 Ohm
Lead Channel Impedance Value: 530 Ohm
Lead Channel Pacing Threshold Amplitude: 0.5 V
Lead Channel Pacing Threshold Amplitude: 0.625 V
Lead Channel Pacing Threshold Pulse Width: 0.5 ms
Lead Channel Pacing Threshold Pulse Width: 0.5 ms
Lead Channel Sensing Intrinsic Amplitude: 3.7 mV
Lead Channel Sensing Intrinsic Amplitude: 8.1 mV
Lead Channel Setting Pacing Amplitude: 0.875
Lead Channel Setting Pacing Amplitude: 1.5 V
Lead Channel Setting Pacing Pulse Width: 0.5 ms
Lead Channel Setting Sensing Sensitivity: 4 mV
Pulse Gen Model: 2272
Pulse Gen Serial Number: 9107223

## 2020-03-18 NOTE — Progress Notes (Signed)
Remote pacemaker transmission.   

## 2020-03-19 DIAGNOSIS — M5033 Other cervical disc degeneration, cervicothoracic region: Secondary | ICD-10-CM | POA: Diagnosis not present

## 2020-03-19 DIAGNOSIS — M9901 Segmental and somatic dysfunction of cervical region: Secondary | ICD-10-CM | POA: Diagnosis not present

## 2020-04-02 DIAGNOSIS — M9901 Segmental and somatic dysfunction of cervical region: Secondary | ICD-10-CM | POA: Diagnosis not present

## 2020-04-02 DIAGNOSIS — M5033 Other cervical disc degeneration, cervicothoracic region: Secondary | ICD-10-CM | POA: Diagnosis not present

## 2020-04-03 DIAGNOSIS — E113313 Type 2 diabetes mellitus with moderate nonproliferative diabetic retinopathy with macular edema, bilateral: Secondary | ICD-10-CM | POA: Diagnosis not present

## 2020-04-03 DIAGNOSIS — Z961 Presence of intraocular lens: Secondary | ICD-10-CM | POA: Diagnosis not present

## 2020-04-03 DIAGNOSIS — H35371 Puckering of macula, right eye: Secondary | ICD-10-CM | POA: Diagnosis not present

## 2020-04-03 DIAGNOSIS — H2512 Age-related nuclear cataract, left eye: Secondary | ICD-10-CM | POA: Diagnosis not present

## 2020-04-08 DIAGNOSIS — M9901 Segmental and somatic dysfunction of cervical region: Secondary | ICD-10-CM | POA: Diagnosis not present

## 2020-04-08 DIAGNOSIS — M5033 Other cervical disc degeneration, cervicothoracic region: Secondary | ICD-10-CM | POA: Diagnosis not present

## 2020-04-13 ENCOUNTER — Telehealth: Payer: Self-pay | Admitting: *Deleted

## 2020-04-13 NOTE — Telephone Encounter (Signed)
   [facility] Pre-operative Risk Assessment    HEARTCARE STAFF: - Please ensure there is not already an duplicate clearance open for this procedure. - Under Visit Info/Reason for Call, type in Other and utilize the format Clearance MM/DD/YY or Clearance TBD. Do not use dashes or single digits. - If request is for dental extraction, please clarify the # of teeth to be extracted.  Request for surgical clearance:  1. What type of surgery is being performed? EXTRACTION OF ALL REMAINING TEETH (25 TEETH) AS WELL AS PLACEMENT OF 4 DENTAL IMPLANTS MANDIBLE  2. When is this surgery scheduled? TBD   3. What type of clearance is required (medical clearance vs. Pharmacy clearance to hold med vs. Both)? MEDICAL  4. Are there any medications that need to be held prior to surgery and how long? ASA   5. Practice name and name of physician performing surgery? [facility]; DR. Ogden   6. What is the office phone number? [phone]   7.   What is the office fax number? [phone]  8.   Anesthesia type (None, local, MAC, general) ? IV SEDATION   Julaine Hua 04/13/2020, 4:06 PM  _________________________________________________________________   (provider comments below)

## 2020-04-13 NOTE — Telephone Encounter (Signed)
   Primary Cardiologist: Carlyle Dolly, MD  Chart reviewed as part of pre-operative protocol coverage. Left voice mail to call back. [place], [place] 04/13/2020, 4:38 PM

## 2020-04-14 NOTE — Telephone Encounter (Signed)
   Primary Cardiologist: Carlyle Dolly, MD  Chart reviewed as part of pre-operative protocol coverage. Given past medical history and time since last visit, based on ACC/AHA guidelines, YOLANDO GILLUM would be at acceptable risk for the planned procedure without further cardiovascular testing.   Patient can hold ASA 7 days prior and restart at direction of surgeon.   I will route this recommendation to the requesting party via Epic fax function and remove from pre-op pool.  Please call with questions.  [place], [place] 04/14/2020, 9:14 AM

## 2020-04-14 NOTE — Telephone Encounter (Signed)
Patient returned call

## 2020-04-15 DIAGNOSIS — M9901 Segmental and somatic dysfunction of cervical region: Secondary | ICD-10-CM | POA: Diagnosis not present

## 2020-04-15 DIAGNOSIS — M5033 Other cervical disc degeneration, cervicothoracic region: Secondary | ICD-10-CM | POA: Diagnosis not present

## 2020-04-21 DIAGNOSIS — M5033 Other cervical disc degeneration, cervicothoracic region: Secondary | ICD-10-CM | POA: Diagnosis not present

## 2020-04-21 DIAGNOSIS — M9901 Segmental and somatic dysfunction of cervical region: Secondary | ICD-10-CM | POA: Diagnosis not present

## 2020-04-30 DIAGNOSIS — M9901 Segmental and somatic dysfunction of cervical region: Secondary | ICD-10-CM | POA: Diagnosis not present

## 2020-04-30 DIAGNOSIS — M5033 Other cervical disc degeneration, cervicothoracic region: Secondary | ICD-10-CM | POA: Diagnosis not present

## 2020-05-04 DIAGNOSIS — M5033 Other cervical disc degeneration, cervicothoracic region: Secondary | ICD-10-CM | POA: Diagnosis not present

## 2020-05-04 DIAGNOSIS — M9901 Segmental and somatic dysfunction of cervical region: Secondary | ICD-10-CM | POA: Diagnosis not present

## 2020-05-10 ENCOUNTER — Other Ambulatory Visit: Payer: Self-pay | Admitting: Cardiology

## 2020-05-11 DIAGNOSIS — M5033 Other cervical disc degeneration, cervicothoracic region: Secondary | ICD-10-CM | POA: Diagnosis not present

## 2020-05-11 DIAGNOSIS — M9901 Segmental and somatic dysfunction of cervical region: Secondary | ICD-10-CM | POA: Diagnosis not present

## 2020-05-21 DIAGNOSIS — M5033 Other cervical disc degeneration, cervicothoracic region: Secondary | ICD-10-CM | POA: Diagnosis not present

## 2020-05-21 DIAGNOSIS — M9901 Segmental and somatic dysfunction of cervical region: Secondary | ICD-10-CM | POA: Diagnosis not present

## 2020-05-25 DIAGNOSIS — M5033 Other cervical disc degeneration, cervicothoracic region: Secondary | ICD-10-CM | POA: Diagnosis not present

## 2020-05-25 DIAGNOSIS — M9901 Segmental and somatic dysfunction of cervical region: Secondary | ICD-10-CM | POA: Diagnosis not present

## 2020-06-03 DIAGNOSIS — M9901 Segmental and somatic dysfunction of cervical region: Secondary | ICD-10-CM | POA: Diagnosis not present

## 2020-06-03 DIAGNOSIS — M5033 Other cervical disc degeneration, cervicothoracic region: Secondary | ICD-10-CM | POA: Diagnosis not present

## 2020-06-09 DIAGNOSIS — M9901 Segmental and somatic dysfunction of cervical region: Secondary | ICD-10-CM | POA: Diagnosis not present

## 2020-06-09 DIAGNOSIS — M5033 Other cervical disc degeneration, cervicothoracic region: Secondary | ICD-10-CM | POA: Diagnosis not present

## 2020-06-19 ENCOUNTER — Ambulatory Visit (INDEPENDENT_AMBULATORY_CARE_PROVIDER_SITE_OTHER): Payer: BC Managed Care – PPO | Admitting: *Deleted

## 2020-06-19 DIAGNOSIS — I442 Atrioventricular block, complete: Secondary | ICD-10-CM

## 2020-06-21 LAB — CUP PACEART REMOTE DEVICE CHECK
Battery Remaining Longevity: 133 mo
Battery Remaining Percentage: 95.5 %
Battery Voltage: 3.01 V
Brady Statistic AP VP Percent: 1 %
Brady Statistic AP VS Percent: 1.4 %
Brady Statistic AS VP Percent: 6.6 %
Brady Statistic AS VS Percent: 92 %
Brady Statistic RA Percent Paced: 1.3 %
Brady Statistic RV Percent Paced: 6.7 %
Date Time Interrogation Session: 20210827161548
Implantable Lead Implant Date: 20200220
Implantable Lead Implant Date: 20200220
Implantable Lead Location: 753859
Implantable Lead Location: 753860
Implantable Pulse Generator Implant Date: 20200220
Lead Channel Impedance Value: 450 Ohm
Lead Channel Impedance Value: 480 Ohm
Lead Channel Pacing Threshold Amplitude: 0.375 V
Lead Channel Pacing Threshold Amplitude: 0.625 V
Lead Channel Pacing Threshold Pulse Width: 0.5 ms
Lead Channel Pacing Threshold Pulse Width: 0.5 ms
Lead Channel Sensing Intrinsic Amplitude: 3.4 mV
Lead Channel Sensing Intrinsic Amplitude: 9 mV
Lead Channel Setting Pacing Amplitude: 0.875
Lead Channel Setting Pacing Amplitude: 1.375
Lead Channel Setting Pacing Pulse Width: 0.5 ms
Lead Channel Setting Sensing Sensitivity: 4 mV
Pulse Gen Model: 2272
Pulse Gen Serial Number: 9107223

## 2020-06-22 DIAGNOSIS — E1165 Type 2 diabetes mellitus with hyperglycemia: Secondary | ICD-10-CM | POA: Diagnosis not present

## 2020-06-22 DIAGNOSIS — E039 Hypothyroidism, unspecified: Secondary | ICD-10-CM | POA: Diagnosis not present

## 2020-06-22 DIAGNOSIS — R809 Proteinuria, unspecified: Secondary | ICD-10-CM | POA: Diagnosis not present

## 2020-06-22 DIAGNOSIS — I10 Essential (primary) hypertension: Secondary | ICD-10-CM | POA: Diagnosis not present

## 2020-06-23 ENCOUNTER — Encounter: Payer: Self-pay | Admitting: Cardiology

## 2020-06-23 DIAGNOSIS — Z1389 Encounter for screening for other disorder: Secondary | ICD-10-CM | POA: Diagnosis not present

## 2020-06-23 DIAGNOSIS — Z6841 Body Mass Index (BMI) 40.0 and over, adult: Secondary | ICD-10-CM | POA: Diagnosis not present

## 2020-06-23 DIAGNOSIS — M9901 Segmental and somatic dysfunction of cervical region: Secondary | ICD-10-CM | POA: Diagnosis not present

## 2020-06-23 DIAGNOSIS — Z950 Presence of cardiac pacemaker: Secondary | ICD-10-CM | POA: Diagnosis not present

## 2020-06-23 DIAGNOSIS — I10 Essential (primary) hypertension: Secondary | ICD-10-CM | POA: Diagnosis not present

## 2020-06-23 DIAGNOSIS — M5033 Other cervical disc degeneration, cervicothoracic region: Secondary | ICD-10-CM | POA: Diagnosis not present

## 2020-06-23 DIAGNOSIS — Z0001 Encounter for general adult medical examination with abnormal findings: Secondary | ICD-10-CM | POA: Diagnosis not present

## 2020-06-23 DIAGNOSIS — E119 Type 2 diabetes mellitus without complications: Secondary | ICD-10-CM | POA: Diagnosis not present

## 2020-06-23 NOTE — Progress Notes (Signed)
Remote pacemaker transmission.   

## 2020-06-24 DIAGNOSIS — E119 Type 2 diabetes mellitus without complications: Secondary | ICD-10-CM | POA: Diagnosis not present

## 2020-07-03 DIAGNOSIS — H35371 Puckering of macula, right eye: Secondary | ICD-10-CM | POA: Diagnosis not present

## 2020-07-03 DIAGNOSIS — E113313 Type 2 diabetes mellitus with moderate nonproliferative diabetic retinopathy with macular edema, bilateral: Secondary | ICD-10-CM | POA: Diagnosis not present

## 2020-07-03 DIAGNOSIS — Z961 Presence of intraocular lens: Secondary | ICD-10-CM | POA: Diagnosis not present

## 2020-07-03 DIAGNOSIS — H2512 Age-related nuclear cataract, left eye: Secondary | ICD-10-CM | POA: Diagnosis not present

## 2020-07-20 DIAGNOSIS — M5033 Other cervical disc degeneration, cervicothoracic region: Secondary | ICD-10-CM | POA: Diagnosis not present

## 2020-07-20 DIAGNOSIS — M9901 Segmental and somatic dysfunction of cervical region: Secondary | ICD-10-CM | POA: Diagnosis not present

## 2020-07-30 ENCOUNTER — Ambulatory Visit: Payer: BC Managed Care – PPO

## 2020-07-30 ENCOUNTER — Ambulatory Visit: Payer: BC Managed Care – PPO | Attending: Internal Medicine

## 2020-07-30 DIAGNOSIS — Z961 Presence of intraocular lens: Secondary | ICD-10-CM | POA: Diagnosis not present

## 2020-07-30 DIAGNOSIS — Z23 Encounter for immunization: Secondary | ICD-10-CM

## 2020-07-30 DIAGNOSIS — H259 Unspecified age-related cataract: Secondary | ICD-10-CM | POA: Diagnosis not present

## 2020-07-30 DIAGNOSIS — H40032 Anatomical narrow angle, left eye: Secondary | ICD-10-CM | POA: Diagnosis not present

## 2020-07-30 DIAGNOSIS — M5033 Other cervical disc degeneration, cervicothoracic region: Secondary | ICD-10-CM | POA: Diagnosis not present

## 2020-07-30 DIAGNOSIS — M9901 Segmental and somatic dysfunction of cervical region: Secondary | ICD-10-CM | POA: Diagnosis not present

## 2020-07-30 NOTE — Progress Notes (Signed)
   Covid-19 Vaccination Clinic  Name:  Jordan Perry    MRN: [id number] DOB: [birth year]  07/30/2020  Jordan Perry was observed post Covid-19 immunization for 15 minutes without incident. She was provided with Vaccine Information Sheet and instruction to access the V-Safe system.   Jordan Perry was instructed to call 911 with any severe reactions post vaccine: Marland Kitchen Difficulty breathing  . Swelling of face and throat  . A fast heartbeat  . A bad rash all over body  . Dizziness and weakness

## 2020-08-06 DIAGNOSIS — M9901 Segmental and somatic dysfunction of cervical region: Secondary | ICD-10-CM | POA: Diagnosis not present

## 2020-08-06 DIAGNOSIS — M5033 Other cervical disc degeneration, cervicothoracic region: Secondary | ICD-10-CM | POA: Diagnosis not present

## 2020-08-13 DIAGNOSIS — M5033 Other cervical disc degeneration, cervicothoracic region: Secondary | ICD-10-CM | POA: Diagnosis not present

## 2020-08-13 DIAGNOSIS — M9901 Segmental and somatic dysfunction of cervical region: Secondary | ICD-10-CM | POA: Diagnosis not present

## 2020-08-19 DIAGNOSIS — M9901 Segmental and somatic dysfunction of cervical region: Secondary | ICD-10-CM | POA: Diagnosis not present

## 2020-08-19 DIAGNOSIS — M5033 Other cervical disc degeneration, cervicothoracic region: Secondary | ICD-10-CM | POA: Diagnosis not present

## 2020-08-27 DIAGNOSIS — M5033 Other cervical disc degeneration, cervicothoracic region: Secondary | ICD-10-CM | POA: Diagnosis not present

## 2020-08-27 DIAGNOSIS — M9901 Segmental and somatic dysfunction of cervical region: Secondary | ICD-10-CM | POA: Diagnosis not present

## 2020-09-02 DIAGNOSIS — M9901 Segmental and somatic dysfunction of cervical region: Secondary | ICD-10-CM | POA: Diagnosis not present

## 2020-09-02 DIAGNOSIS — M5033 Other cervical disc degeneration, cervicothoracic region: Secondary | ICD-10-CM | POA: Diagnosis not present

## 2020-09-10 DIAGNOSIS — H40032 Anatomical narrow angle, left eye: Secondary | ICD-10-CM | POA: Diagnosis not present

## 2020-09-10 DIAGNOSIS — H2512 Age-related nuclear cataract, left eye: Secondary | ICD-10-CM | POA: Diagnosis not present

## 2020-09-10 DIAGNOSIS — M9901 Segmental and somatic dysfunction of cervical region: Secondary | ICD-10-CM | POA: Diagnosis not present

## 2020-09-10 DIAGNOSIS — M5033 Other cervical disc degeneration, cervicothoracic region: Secondary | ICD-10-CM | POA: Diagnosis not present

## 2020-09-10 DIAGNOSIS — Z961 Presence of intraocular lens: Secondary | ICD-10-CM | POA: Diagnosis not present

## 2020-09-14 DIAGNOSIS — M9901 Segmental and somatic dysfunction of cervical region: Secondary | ICD-10-CM | POA: Diagnosis not present

## 2020-09-14 DIAGNOSIS — M5033 Other cervical disc degeneration, cervicothoracic region: Secondary | ICD-10-CM | POA: Diagnosis not present

## 2020-09-21 ENCOUNTER — Ambulatory Visit (INDEPENDENT_AMBULATORY_CARE_PROVIDER_SITE_OTHER): Payer: BC Managed Care – PPO

## 2020-09-21 DIAGNOSIS — H40032 Anatomical narrow angle, left eye: Secondary | ICD-10-CM | POA: Diagnosis not present

## 2020-09-21 DIAGNOSIS — Z888 Allergy status to other drugs, medicaments and biological substances status: Secondary | ICD-10-CM | POA: Diagnosis not present

## 2020-09-21 DIAGNOSIS — I442 Atrioventricular block, complete: Secondary | ICD-10-CM | POA: Diagnosis not present

## 2020-09-21 DIAGNOSIS — Z950 Presence of cardiac pacemaker: Secondary | ICD-10-CM | POA: Diagnosis not present

## 2020-09-21 DIAGNOSIS — R001 Bradycardia, unspecified: Secondary | ICD-10-CM | POA: Diagnosis not present

## 2020-09-21 DIAGNOSIS — K219 Gastro-esophageal reflux disease without esophagitis: Secondary | ICD-10-CM | POA: Diagnosis not present

## 2020-09-21 DIAGNOSIS — H25812 Combined forms of age-related cataract, left eye: Secondary | ICD-10-CM | POA: Diagnosis not present

## 2020-09-21 DIAGNOSIS — Z8669 Personal history of other diseases of the nervous system and sense organs: Secondary | ICD-10-CM | POA: Diagnosis not present

## 2020-09-21 DIAGNOSIS — Z7982 Long term (current) use of aspirin: Secondary | ICD-10-CM | POA: Diagnosis not present

## 2020-09-21 DIAGNOSIS — Z7984 Long term (current) use of oral hypoglycemic drugs: Secondary | ICD-10-CM | POA: Diagnosis not present

## 2020-09-21 DIAGNOSIS — E11311 Type 2 diabetes mellitus with unspecified diabetic retinopathy with macular edema: Secondary | ICD-10-CM | POA: Diagnosis not present

## 2020-09-21 DIAGNOSIS — E113393 Type 2 diabetes mellitus with moderate nonproliferative diabetic retinopathy without macular edema, bilateral: Secondary | ICD-10-CM | POA: Diagnosis not present

## 2020-09-21 DIAGNOSIS — E1136 Type 2 diabetes mellitus with diabetic cataract: Secondary | ICD-10-CM | POA: Diagnosis not present

## 2020-09-21 DIAGNOSIS — H259 Unspecified age-related cataract: Secondary | ICD-10-CM | POA: Diagnosis not present

## 2020-09-21 DIAGNOSIS — E039 Hypothyroidism, unspecified: Secondary | ICD-10-CM | POA: Diagnosis not present

## 2020-09-21 DIAGNOSIS — Z79899 Other long term (current) drug therapy: Secondary | ICD-10-CM | POA: Diagnosis not present

## 2020-09-21 DIAGNOSIS — E785 Hyperlipidemia, unspecified: Secondary | ICD-10-CM | POA: Diagnosis not present

## 2020-09-21 DIAGNOSIS — H527 Unspecified disorder of refraction: Secondary | ICD-10-CM | POA: Diagnosis not present

## 2020-09-21 DIAGNOSIS — Z79890 Hormone replacement therapy (postmenopausal): Secondary | ICD-10-CM | POA: Diagnosis not present

## 2020-09-21 LAB — CUP PACEART REMOTE DEVICE CHECK
Battery Remaining Longevity: 134 mo
Battery Remaining Percentage: 95.5 %
Battery Voltage: 3.01 V
Brady Statistic AP VP Percent: 1 %
Brady Statistic AP VS Percent: 1.6 %
Brady Statistic AS VP Percent: 4.1 %
Brady Statistic AS VS Percent: 94 %
Brady Statistic RA Percent Paced: 1.5 %
Brady Statistic RV Percent Paced: 4.2 %
Date Time Interrogation Session: 20211129020015
Implantable Lead Implant Date: 20200220
Implantable Lead Implant Date: 20200220
Implantable Lead Location: 753859
Implantable Lead Location: 753860
Implantable Pulse Generator Implant Date: 20200220
Lead Channel Impedance Value: 480 Ohm
Lead Channel Impedance Value: 510 Ohm
Lead Channel Pacing Threshold Amplitude: 0.375 V
Lead Channel Pacing Threshold Amplitude: 0.625 V
Lead Channel Pacing Threshold Pulse Width: 0.5 ms
Lead Channel Pacing Threshold Pulse Width: 0.5 ms
Lead Channel Sensing Intrinsic Amplitude: 3.5 mV
Lead Channel Sensing Intrinsic Amplitude: 8.7 mV
Lead Channel Setting Pacing Amplitude: 0.875
Lead Channel Setting Pacing Amplitude: 1.375
Lead Channel Setting Pacing Pulse Width: 0.5 ms
Lead Channel Setting Sensing Sensitivity: 4 mV
Pulse Gen Model: 2272
Pulse Gen Serial Number: 9107223

## 2020-09-24 DIAGNOSIS — M5033 Other cervical disc degeneration, cervicothoracic region: Secondary | ICD-10-CM | POA: Diagnosis not present

## 2020-09-24 DIAGNOSIS — M9901 Segmental and somatic dysfunction of cervical region: Secondary | ICD-10-CM | POA: Diagnosis not present

## 2020-09-25 NOTE — Progress Notes (Signed)
Remote pacemaker transmission.   

## 2020-09-26 ENCOUNTER — Other Ambulatory Visit: Payer: Self-pay | Admitting: Cardiology

## 2020-09-30 DIAGNOSIS — M5033 Other cervical disc degeneration, cervicothoracic region: Secondary | ICD-10-CM | POA: Diagnosis not present

## 2020-09-30 DIAGNOSIS — M9901 Segmental and somatic dysfunction of cervical region: Secondary | ICD-10-CM | POA: Diagnosis not present

## 2020-10-08 DIAGNOSIS — M5033 Other cervical disc degeneration, cervicothoracic region: Secondary | ICD-10-CM | POA: Diagnosis not present

## 2020-10-08 DIAGNOSIS — M9901 Segmental and somatic dysfunction of cervical region: Secondary | ICD-10-CM | POA: Diagnosis not present

## 2020-10-13 DIAGNOSIS — M9901 Segmental and somatic dysfunction of cervical region: Secondary | ICD-10-CM | POA: Diagnosis not present

## 2020-10-13 DIAGNOSIS — M5033 Other cervical disc degeneration, cervicothoracic region: Secondary | ICD-10-CM | POA: Diagnosis not present

## 2020-10-22 DIAGNOSIS — M5033 Other cervical disc degeneration, cervicothoracic region: Secondary | ICD-10-CM | POA: Diagnosis not present

## 2020-10-22 DIAGNOSIS — M9901 Segmental and somatic dysfunction of cervical region: Secondary | ICD-10-CM | POA: Diagnosis not present

## 2020-10-28 DIAGNOSIS — H2512 Age-related nuclear cataract, left eye: Secondary | ICD-10-CM | POA: Diagnosis not present

## 2020-10-28 DIAGNOSIS — E113313 Type 2 diabetes mellitus with moderate nonproliferative diabetic retinopathy with macular edema, bilateral: Secondary | ICD-10-CM | POA: Diagnosis not present

## 2020-10-28 DIAGNOSIS — Z961 Presence of intraocular lens: Secondary | ICD-10-CM | POA: Diagnosis not present

## 2020-10-28 DIAGNOSIS — H2021 Lens-induced iridocyclitis, right eye: Secondary | ICD-10-CM | POA: Diagnosis not present

## 2020-10-29 DIAGNOSIS — M9901 Segmental and somatic dysfunction of cervical region: Secondary | ICD-10-CM | POA: Diagnosis not present

## 2020-10-29 DIAGNOSIS — M5033 Other cervical disc degeneration, cervicothoracic region: Secondary | ICD-10-CM | POA: Diagnosis not present

## 2020-11-05 DIAGNOSIS — M5033 Other cervical disc degeneration, cervicothoracic region: Secondary | ICD-10-CM | POA: Diagnosis not present

## 2020-11-05 DIAGNOSIS — M9901 Segmental and somatic dysfunction of cervical region: Secondary | ICD-10-CM | POA: Diagnosis not present

## 2020-11-12 DIAGNOSIS — H40051 Ocular hypertension, right eye: Secondary | ICD-10-CM | POA: Diagnosis not present

## 2020-11-12 DIAGNOSIS — Z961 Presence of intraocular lens: Secondary | ICD-10-CM | POA: Diagnosis not present

## 2020-11-12 DIAGNOSIS — H4051X2 Glaucoma secondary to other eye disorders, right eye, moderate stage: Secondary | ICD-10-CM | POA: Diagnosis not present

## 2020-11-12 DIAGNOSIS — H4089 Other specified glaucoma: Secondary | ICD-10-CM | POA: Diagnosis not present

## 2020-11-12 DIAGNOSIS — H209 Unspecified iridocyclitis: Secondary | ICD-10-CM | POA: Diagnosis not present

## 2020-11-12 DIAGNOSIS — E113313 Type 2 diabetes mellitus with moderate nonproliferative diabetic retinopathy with macular edema, bilateral: Secondary | ICD-10-CM | POA: Diagnosis not present

## 2020-11-12 DIAGNOSIS — H2021 Lens-induced iridocyclitis, right eye: Secondary | ICD-10-CM | POA: Diagnosis not present

## 2020-11-13 DIAGNOSIS — H40022 Open angle with borderline findings, high risk, left eye: Secondary | ICD-10-CM | POA: Diagnosis not present

## 2020-11-13 DIAGNOSIS — Z794 Long term (current) use of insulin: Secondary | ICD-10-CM | POA: Diagnosis not present

## 2020-11-13 DIAGNOSIS — E113213 Type 2 diabetes mellitus with mild nonproliferative diabetic retinopathy with macular edema, bilateral: Secondary | ICD-10-CM | POA: Diagnosis not present

## 2020-11-13 DIAGNOSIS — H401114 Primary open-angle glaucoma, right eye, indeterminate stage: Secondary | ICD-10-CM | POA: Diagnosis not present

## 2020-11-17 DIAGNOSIS — M5033 Other cervical disc degeneration, cervicothoracic region: Secondary | ICD-10-CM | POA: Diagnosis not present

## 2020-11-17 DIAGNOSIS — M9901 Segmental and somatic dysfunction of cervical region: Secondary | ICD-10-CM | POA: Diagnosis not present

## 2020-11-17 DIAGNOSIS — T85398A Other mechanical complication of other ocular prosthetic devices, implants and grafts, initial encounter: Secondary | ICD-10-CM | POA: Diagnosis not present

## 2020-11-17 DIAGNOSIS — H209 Unspecified iridocyclitis: Secondary | ICD-10-CM | POA: Diagnosis not present

## 2020-11-17 DIAGNOSIS — Z961 Presence of intraocular lens: Secondary | ICD-10-CM | POA: Diagnosis not present

## 2020-11-17 DIAGNOSIS — H4041X0 Glaucoma secondary to eye inflammation, right eye, stage unspecified: Secondary | ICD-10-CM | POA: Diagnosis not present

## 2020-11-17 DIAGNOSIS — Z9889 Other specified postprocedural states: Secondary | ICD-10-CM | POA: Diagnosis not present

## 2020-11-23 DIAGNOSIS — M9901 Segmental and somatic dysfunction of cervical region: Secondary | ICD-10-CM | POA: Diagnosis not present

## 2020-11-23 DIAGNOSIS — M5033 Other cervical disc degeneration, cervicothoracic region: Secondary | ICD-10-CM | POA: Diagnosis not present

## 2020-11-25 DIAGNOSIS — E1139 Type 2 diabetes mellitus with other diabetic ophthalmic complication: Secondary | ICD-10-CM | POA: Diagnosis not present

## 2020-11-25 DIAGNOSIS — E113213 Type 2 diabetes mellitus with mild nonproliferative diabetic retinopathy with macular edema, bilateral: Secondary | ICD-10-CM | POA: Diagnosis not present

## 2020-11-25 DIAGNOSIS — H2101 Hyphema, right eye: Secondary | ICD-10-CM | POA: Diagnosis not present

## 2020-11-25 DIAGNOSIS — I10 Essential (primary) hypertension: Secondary | ICD-10-CM | POA: Diagnosis not present

## 2020-11-25 DIAGNOSIS — Z794 Long term (current) use of insulin: Secondary | ICD-10-CM | POA: Diagnosis not present

## 2020-11-25 DIAGNOSIS — H42 Glaucoma in diseases classified elsewhere: Secondary | ICD-10-CM | POA: Diagnosis not present

## 2020-11-25 DIAGNOSIS — E039 Hypothyroidism, unspecified: Secondary | ICD-10-CM | POA: Diagnosis not present

## 2020-11-25 DIAGNOSIS — I442 Atrioventricular block, complete: Secondary | ICD-10-CM | POA: Diagnosis not present

## 2020-11-25 DIAGNOSIS — E119 Type 2 diabetes mellitus without complications: Secondary | ICD-10-CM | POA: Diagnosis not present

## 2020-11-25 DIAGNOSIS — Z79890 Hormone replacement therapy (postmenopausal): Secondary | ICD-10-CM | POA: Diagnosis not present

## 2020-11-25 DIAGNOSIS — Z7982 Long term (current) use of aspirin: Secondary | ICD-10-CM | POA: Diagnosis not present

## 2020-11-25 DIAGNOSIS — T8522XA Displacement of intraocular lens, initial encounter: Secondary | ICD-10-CM | POA: Diagnosis not present

## 2020-11-25 DIAGNOSIS — H4089 Other specified glaucoma: Secondary | ICD-10-CM | POA: Diagnosis not present

## 2020-11-25 DIAGNOSIS — Z950 Presence of cardiac pacemaker: Secondary | ICD-10-CM | POA: Diagnosis not present

## 2020-11-25 DIAGNOSIS — H4041X4 Glaucoma secondary to eye inflammation, right eye, indeterminate stage: Secondary | ICD-10-CM | POA: Diagnosis not present

## 2020-11-25 DIAGNOSIS — H401114 Primary open-angle glaucoma, right eye, indeterminate stage: Secondary | ICD-10-CM | POA: Diagnosis not present

## 2020-11-25 DIAGNOSIS — H209 Unspecified iridocyclitis: Secondary | ICD-10-CM | POA: Diagnosis not present

## 2020-11-25 DIAGNOSIS — K219 Gastro-esophageal reflux disease without esophagitis: Secondary | ICD-10-CM | POA: Diagnosis not present

## 2020-12-03 DIAGNOSIS — M5033 Other cervical disc degeneration, cervicothoracic region: Secondary | ICD-10-CM | POA: Diagnosis not present

## 2020-12-03 DIAGNOSIS — M9901 Segmental and somatic dysfunction of cervical region: Secondary | ICD-10-CM | POA: Diagnosis not present

## 2020-12-16 DIAGNOSIS — E1165 Type 2 diabetes mellitus with hyperglycemia: Secondary | ICD-10-CM | POA: Diagnosis not present

## 2020-12-16 DIAGNOSIS — E039 Hypothyroidism, unspecified: Secondary | ICD-10-CM | POA: Diagnosis not present

## 2020-12-16 DIAGNOSIS — E7800 Pure hypercholesterolemia, unspecified: Secondary | ICD-10-CM | POA: Diagnosis not present

## 2020-12-16 DIAGNOSIS — R809 Proteinuria, unspecified: Secondary | ICD-10-CM | POA: Diagnosis not present

## 2020-12-17 DIAGNOSIS — M9901 Segmental and somatic dysfunction of cervical region: Secondary | ICD-10-CM | POA: Diagnosis not present

## 2020-12-17 DIAGNOSIS — Z961 Presence of intraocular lens: Secondary | ICD-10-CM | POA: Diagnosis not present

## 2020-12-17 DIAGNOSIS — H4051X2 Glaucoma secondary to other eye disorders, right eye, moderate stage: Secondary | ICD-10-CM | POA: Diagnosis not present

## 2020-12-17 DIAGNOSIS — E113313 Type 2 diabetes mellitus with moderate nonproliferative diabetic retinopathy with macular edema, bilateral: Secondary | ICD-10-CM | POA: Diagnosis not present

## 2020-12-17 DIAGNOSIS — M5033 Other cervical disc degeneration, cervicothoracic region: Secondary | ICD-10-CM | POA: Diagnosis not present

## 2020-12-21 ENCOUNTER — Ambulatory Visit (INDEPENDENT_AMBULATORY_CARE_PROVIDER_SITE_OTHER): Payer: BC Managed Care – PPO

## 2020-12-21 DIAGNOSIS — I442 Atrioventricular block, complete: Secondary | ICD-10-CM

## 2020-12-23 DIAGNOSIS — E1165 Type 2 diabetes mellitus with hyperglycemia: Secondary | ICD-10-CM | POA: Diagnosis not present

## 2020-12-23 DIAGNOSIS — R809 Proteinuria, unspecified: Secondary | ICD-10-CM | POA: Diagnosis not present

## 2020-12-23 DIAGNOSIS — E039 Hypothyroidism, unspecified: Secondary | ICD-10-CM | POA: Diagnosis not present

## 2020-12-23 DIAGNOSIS — I10 Essential (primary) hypertension: Secondary | ICD-10-CM | POA: Diagnosis not present

## 2020-12-23 LAB — CUP PACEART REMOTE DEVICE CHECK
Battery Remaining Longevity: 133 mo
Battery Remaining Percentage: 95.5 %
Battery Voltage: 3.01 V
Brady Statistic AP VP Percent: 1 %
Brady Statistic AP VS Percent: 1.7 %
Brady Statistic AS VP Percent: 3.1 %
Brady Statistic AS VS Percent: 95 %
Brady Statistic RA Percent Paced: 1.6 %
Brady Statistic RV Percent Paced: 3.1 %
Date Time Interrogation Session: 20220228020016
Implantable Lead Implant Date: 20200220
Implantable Lead Implant Date: 20200220
Implantable Lead Location: 753859
Implantable Lead Location: 753860
Implantable Pulse Generator Implant Date: 20200220
Lead Channel Impedance Value: 450 Ohm
Lead Channel Impedance Value: 460 Ohm
Lead Channel Pacing Threshold Amplitude: 0.375 V
Lead Channel Pacing Threshold Amplitude: 0.625 V
Lead Channel Pacing Threshold Pulse Width: 0.5 ms
Lead Channel Pacing Threshold Pulse Width: 0.5 ms
Lead Channel Sensing Intrinsic Amplitude: 11.2 mV
Lead Channel Sensing Intrinsic Amplitude: 3 mV
Lead Channel Setting Pacing Amplitude: 0.875
Lead Channel Setting Pacing Amplitude: 1.375
Lead Channel Setting Pacing Pulse Width: 0.5 ms
Lead Channel Setting Sensing Sensitivity: 4 mV
Pulse Gen Model: 2272
Pulse Gen Serial Number: 9107223

## 2020-12-28 NOTE — Progress Notes (Signed)
Remote pacemaker transmission.   

## 2020-12-31 DIAGNOSIS — M5033 Other cervical disc degeneration, cervicothoracic region: Secondary | ICD-10-CM | POA: Diagnosis not present

## 2020-12-31 DIAGNOSIS — M9901 Segmental and somatic dysfunction of cervical region: Secondary | ICD-10-CM | POA: Diagnosis not present

## 2021-01-07 DIAGNOSIS — M5033 Other cervical disc degeneration, cervicothoracic region: Secondary | ICD-10-CM | POA: Diagnosis not present

## 2021-01-07 DIAGNOSIS — M9901 Segmental and somatic dysfunction of cervical region: Secondary | ICD-10-CM | POA: Diagnosis not present

## 2021-01-14 DIAGNOSIS — M9901 Segmental and somatic dysfunction of cervical region: Secondary | ICD-10-CM | POA: Diagnosis not present

## 2021-01-14 DIAGNOSIS — M5033 Other cervical disc degeneration, cervicothoracic region: Secondary | ICD-10-CM | POA: Diagnosis not present

## 2021-01-21 DIAGNOSIS — M5033 Other cervical disc degeneration, cervicothoracic region: Secondary | ICD-10-CM | POA: Diagnosis not present

## 2021-01-21 DIAGNOSIS — M9901 Segmental and somatic dysfunction of cervical region: Secondary | ICD-10-CM | POA: Diagnosis not present

## 2021-01-27 DIAGNOSIS — Z6841 Body Mass Index (BMI) 40.0 and over, adult: Secondary | ICD-10-CM | POA: Diagnosis not present

## 2021-01-27 DIAGNOSIS — L509 Urticaria, unspecified: Secondary | ICD-10-CM | POA: Diagnosis not present

## 2021-01-27 DIAGNOSIS — Z1331 Encounter for screening for depression: Secondary | ICD-10-CM | POA: Diagnosis not present

## 2021-01-27 DIAGNOSIS — E118 Type 2 diabetes mellitus with unspecified complications: Secondary | ICD-10-CM | POA: Diagnosis not present

## 2021-01-27 DIAGNOSIS — Z23 Encounter for immunization: Secondary | ICD-10-CM | POA: Diagnosis not present

## 2021-01-27 DIAGNOSIS — F419 Anxiety disorder, unspecified: Secondary | ICD-10-CM | POA: Diagnosis not present

## 2021-01-27 DIAGNOSIS — Z1389 Encounter for screening for other disorder: Secondary | ICD-10-CM | POA: Diagnosis not present

## 2021-01-28 DIAGNOSIS — M9901 Segmental and somatic dysfunction of cervical region: Secondary | ICD-10-CM | POA: Diagnosis not present

## 2021-01-28 DIAGNOSIS — M5033 Other cervical disc degeneration, cervicothoracic region: Secondary | ICD-10-CM | POA: Diagnosis not present

## 2021-02-03 ENCOUNTER — Encounter: Payer: Self-pay | Admitting: Cardiology

## 2021-02-03 ENCOUNTER — Other Ambulatory Visit: Payer: Self-pay

## 2021-02-03 ENCOUNTER — Ambulatory Visit: Payer: BC Managed Care – PPO | Admitting: Cardiology

## 2021-02-03 VITALS — BP 108/56 | HR 80 | Ht 64.0 in | Wt 244.6 lb

## 2021-02-03 DIAGNOSIS — I442 Atrioventricular block, complete: Secondary | ICD-10-CM | POA: Diagnosis not present

## 2021-02-03 DIAGNOSIS — Z8249 Family history of ischemic heart disease and other diseases of the circulatory system: Secondary | ICD-10-CM

## 2021-02-03 DIAGNOSIS — I10 Essential (primary) hypertension: Secondary | ICD-10-CM

## 2021-02-03 DIAGNOSIS — M5033 Other cervical disc degeneration, cervicothoracic region: Secondary | ICD-10-CM | POA: Diagnosis not present

## 2021-02-03 DIAGNOSIS — E782 Mixed hyperlipidemia: Secondary | ICD-10-CM

## 2021-02-03 DIAGNOSIS — M9901 Segmental and somatic dysfunction of cervical region: Secondary | ICD-10-CM | POA: Diagnosis not present

## 2021-02-03 NOTE — Patient Instructions (Signed)

## 2021-02-03 NOTE — Progress Notes (Signed)
Clinical Summary Ms. Weissinger is a 65 y.o.female  seen today for follow up of the following medical problems.     1. Family history of CAD - normal stress myoview in 2009 - remote history of cath when seeing Dr Verl Blalock, no significant CAD  - no recent chest pains  2. HTN - she is compliant with meds  3. Hyperlipidemia - labs followed by pcp  11/2020 TC 115 TG 164 HDL 35 LDL 52 - compliant with statin  4. DM2 - followed by pcp  5. LE edema - has not required prn lasix.   6. Complete heart block - pacemaker followed by EP, placed 11/2018 - normal device function, 14 secs of afib  11/2020 normal device check  SH: sister [name] who was pervious patient of mine who passed away 03-Dec-2015. Her husband Salli Bodin is also a patient of mine.  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 Allergic rhinitis, cause unspecified   . Arthritis   . Coronary atherosclerosis of unspecified type of vessel, native or graft   . GERD (gastroesophageal reflux disease)   . Hypothyroidism   . Proteinuria   . Pure hyperglyceridemia   . Type II or unspecified type diabetes mellitus without mention of complication, not stated as uncontrolled   . Unspecified essential hypertension      Allergies  Allergen Reactions  . Invokana [Canagliflozin] Other (See Comments)    Skin peeling  . Janumet [Sitagliptin-Metformin Hcl] Other (See Comments)    Skin peeling  . Cephalexin Other (See Comments)  . Penicillins     Has patient had a PCN reaction causing immediate rash, facial/tongue/throat swelling, SOB or lightheadedness with hypotension: Yes-immediate rash Has patient had a PCN reaction causing severe rash involving mucus membranes or skin necrosis: Yes Has patient had a PCN reaction that required hospitalization: No Has patient had a PCN reaction occurring within the last 10 years: No If all of the above answers are  "NO", then may proceed with Cephalosporin use.   . Prednisone Rash     Current Outpatient Medications  Medication Sig Dispense Refill  . acetaminophen (TYLENOL) 500 MG tablet Take 1,000-1,500 mg by mouth as needed (for headaches.).     Marland Kitchen aspirin EC 81 MG tablet Take 81 mg by mouth daily.    Marland Kitchen atorvastatin (LIPITOR) 80 MG tablet TAKE 1 TABLET AT BEDTIME 90 tablet 3  . furosemide (LASIX) 20 MG tablet TAKE 1 TABLET AS NEEDED (Patient taking differently: Take 20 mg by mouth daily as needed for fluid or edema. ) 90 tablet 1  . HUMULIN N KWIKPEN 100 UNIT/ML Kiwkpen Inject 40-60 Units into the skin See admin instructions. Inject 60 units subcutaneously in the morning & 40 units subcutaneously at night.    . insulin aspart (NOVOLOG) 100 UNIT/ML injection Inject 20-40 Units into the skin 3 (three) times daily before meals. Sliding Scale Insulin    . lisinopril (ZESTRIL) 20 MG tablet TAKE 1 TABLET DAILY 90 tablet 3  . meloxicam (MOBIC) 7.5 MG tablet Take 1 tablet (7.5 mg total) by mouth daily. 30 tablet 5  . metFORMIN (GLUCOPHAGE-XR) 500 MG 24 hr tablet Take 500 mg by mouth 2 (two) times daily after a meal.     . Multiple Vitamin (MULTIVITAMIN WITH MINERALS) TABS tablet Take 1 tablet by mouth daily. Centrum Silver Multivitamin    . Probiotic Product (ALIGN)  4 MG CAPS Take 1 capsule by mouth daily.    Marland Kitchen SYNTHROID 125 MCG tablet Take 125 mcg by mouth daily before breakfast.      No current facility-administered medications for this visit.     Past Surgical History:  Procedure Laterality Date  . COLONOSCOPY N/A 10/02/2019   Procedure: COLONOSCOPY;  Surgeon: Daneil Dolin, MD;  [facility];  Service: Endoscopy;  Laterality: N/A;  9:30  . CYSTECTOMY  1989   removed from end of spine  . PACEMAKER IMPLANT N/A 12/13/2018   Procedure: PACEMAKER IMPLANT;  Surgeon: Evans Lance, MD;  Location: [place] [facility];  Service: Cardiovascular;  Laterality: N/A;  . POLYPECTOMY  10/02/2019    Procedure: POLYPECTOMY;  Surgeon: Daneil Dolin, MD;  [facility];  Service: Endoscopy;;  . [place]  . Right lens implant       Allergies  Allergen Reactions  . Invokana [Canagliflozin] Other (See Comments)    Skin peeling  . Janumet [Sitagliptin-Metformin Hcl] Other (See Comments)    Skin peeling  . Cephalexin Other (See Comments)  . Penicillins     Has patient had a PCN reaction causing immediate rash, facial/tongue/throat swelling, SOB or lightheadedness with hypotension: Yes-immediate rash Has patient had a PCN reaction causing severe rash involving mucus membranes or skin necrosis: Yes Has patient had a PCN reaction that required hospitalization: No Has patient had a PCN reaction occurring within the last 10 years: No If all of the above answers are "NO", then may proceed with Cephalosporin use.   . Prednisone Rash      Family History  Problem Relation Age of Onset  . COPD Mother 30  . Heart disease Father   . Cancer Father 61  . Coronary artery disease Other   . Diabetes Other   . Obesity Other   . Heart attack Brother 72  . Coronary artery disease Sister   . Hyperlipidemia Other   . Hypertension Other   . Colon cancer Neg Hx      Social History Ms. Fearnow reports that she has never smoked. She has never used smokeless tobacco. Ms. Matty reports no history of alcohol use.   Review of Systems CONSTITUTIONAL: No weight loss, fever, chills, weakness or fatigue.  HEENT: Eyes: No visual loss, blurred vision, double vision or yellow sclerae.No hearing loss, sneezing, congestion, runny nose or sore throat.  SKIN: No rash or itching.  CARDIOVASCULAR: per hpi RESPIRATORY: No shortness of breath, cough or sputum.  GASTROINTESTINAL: No anorexia, nausea, vomiting or diarrhea. No abdominal pain or blood.  GENITOURINARY: No burning on urination, no polyuria NEUROLOGICAL: No headache, dizziness, syncope, paralysis, ataxia, numbness or  tingling in the extremities. No change in bowel or bladder control.  MUSCULOSKELETAL: No muscle, back pain, joint pain or stiffness.  LYMPHATICS: No enlarged nodes. No history of splenectomy.  PSYCHIATRIC: No history of depression or anxiety.  ENDOCRINOLOGIC: No reports of sweating, cold or heat intolerance. No polyuria or polydipsia.  Marland Kitchen   Physical Examination Today's Vitals   02/03/21 1333  BP: (!) 108/56  Pulse: 80  SpO2: 98%  Weight: 244 lb 9.6 oz (110.9 kg)  Height: 5\' 4"  (1.626 m)   Body mass index is 41.99 kg/m.  Gen: resting comfortably, no acute distress HEENT: no scleral icterus, pupils equal round and reactive, no palptable cervical adenopathy,  CV: RRR, no m/r/g, no jvd Resp: Clear to auscultation bilaterally GI: abdomen is soft, non-tender, non-distended, normal  bowel sounds, no hepatosplenomegaly MSK: extremities are warm, no edema.  Skin: warm, no rash Neuro:  no focal deficits Psych: appropriate affect   Diagnostic Studies  11/2018 echo 1. The left ventricle has normal systolic function with an ejection fraction of 60-65%. The cavity size was normal. Left ventricular diastolic Doppler parameters are indeterminate. 2. The mitral valve is normal in structure. Mild thickening of the mitral valve leaflet. No evidence of mitral valve stenosis. 3. The tricuspid valve is normal in structure. 4. The aortic valve is tricuspid Mild thickening of the aortic valve no stenosis of the aortic valve. 5. The aortic root is normal in size and structure. 6. Pulmonary hypertension is mildly elevated, PASP is 38 mmHg. 7. The inferior vena cava was dilated in size with >50% respiratory variability. 8. The interatrial septum was not well visualized. 9. RV poorly visualized. Grossly appears enlarged with normal function    Assessment and Plan  1. Family history of CAD - strong family history of CAD. She has no known history, and has had negative cath and prior stress  testing -no symptoms, continue to monitor, conintue risk factor modification -EKG today shows SR, no ischemic changes  2. HTN - at goal, continue current meds  3. Hyperlipidemia LDL at goal, continue statin. Discussed lifestyle modifications to improve TGs and HDL  4. Lower extremityedema -no recent issues, continue prn lasix.   5. Complete heart block - s/p pacemaker placement - normal device function on last check, continue to monitor.      Arnoldo Lenis, M.D.

## 2021-02-18 DIAGNOSIS — M9901 Segmental and somatic dysfunction of cervical region: Secondary | ICD-10-CM | POA: Diagnosis not present

## 2021-02-18 DIAGNOSIS — M5033 Other cervical disc degeneration, cervicothoracic region: Secondary | ICD-10-CM | POA: Diagnosis not present

## 2021-02-24 DIAGNOSIS — M9901 Segmental and somatic dysfunction of cervical region: Secondary | ICD-10-CM | POA: Diagnosis not present

## 2021-02-24 DIAGNOSIS — M5033 Other cervical disc degeneration, cervicothoracic region: Secondary | ICD-10-CM | POA: Diagnosis not present

## 2021-03-03 DIAGNOSIS — M9901 Segmental and somatic dysfunction of cervical region: Secondary | ICD-10-CM | POA: Diagnosis not present

## 2021-03-03 DIAGNOSIS — M5033 Other cervical disc degeneration, cervicothoracic region: Secondary | ICD-10-CM | POA: Diagnosis not present

## 2021-03-11 DIAGNOSIS — M9901 Segmental and somatic dysfunction of cervical region: Secondary | ICD-10-CM | POA: Diagnosis not present

## 2021-03-11 DIAGNOSIS — M5033 Other cervical disc degeneration, cervicothoracic region: Secondary | ICD-10-CM | POA: Diagnosis not present

## 2021-03-16 ENCOUNTER — Other Ambulatory Visit: Payer: Self-pay | Admitting: Orthopedic Surgery

## 2021-03-16 DIAGNOSIS — M25512 Pain in left shoulder: Secondary | ICD-10-CM

## 2021-03-16 DIAGNOSIS — M7542 Impingement syndrome of left shoulder: Secondary | ICD-10-CM

## 2021-03-16 DIAGNOSIS — M542 Cervicalgia: Secondary | ICD-10-CM

## 2021-03-16 DIAGNOSIS — G8929 Other chronic pain: Secondary | ICD-10-CM

## 2021-03-18 DIAGNOSIS — M9901 Segmental and somatic dysfunction of cervical region: Secondary | ICD-10-CM | POA: Diagnosis not present

## 2021-03-18 DIAGNOSIS — M5033 Other cervical disc degeneration, cervicothoracic region: Secondary | ICD-10-CM | POA: Diagnosis not present

## 2021-03-22 LAB — CUP PACEART REMOTE DEVICE CHECK
Battery Remaining Longevity: 133 mo
Battery Remaining Percentage: 95.5 %
Battery Voltage: 3.01 V
Brady Statistic AP VP Percent: 1 %
Brady Statistic AP VS Percent: 1.5 %
Brady Statistic AS VP Percent: 2.5 %
Brady Statistic AS VS Percent: 96 %
Brady Statistic RA Percent Paced: 1.3 %
Brady Statistic RV Percent Paced: 2.6 %
Date Time Interrogation Session: 20220530020013
Implantable Lead Implant Date: 20200220
Implantable Lead Implant Date: 20200220
Implantable Lead Location: 753859
Implantable Lead Location: 753860
Implantable Pulse Generator Implant Date: 20200220
Lead Channel Impedance Value: 440 Ohm
Lead Channel Impedance Value: 450 Ohm
Lead Channel Pacing Threshold Amplitude: 0.375 V
Lead Channel Pacing Threshold Amplitude: 0.75 V
Lead Channel Pacing Threshold Pulse Width: 0.5 ms
Lead Channel Pacing Threshold Pulse Width: 0.5 ms
Lead Channel Sensing Intrinsic Amplitude: 4.1 mV
Lead Channel Sensing Intrinsic Amplitude: 7.7 mV
Lead Channel Setting Pacing Amplitude: 1 V
Lead Channel Setting Pacing Amplitude: 1.375
Lead Channel Setting Pacing Pulse Width: 0.5 ms
Lead Channel Setting Sensing Sensitivity: 4 mV
Pulse Gen Model: 2272
Pulse Gen Serial Number: 9107223

## 2021-03-23 ENCOUNTER — Ambulatory Visit (INDEPENDENT_AMBULATORY_CARE_PROVIDER_SITE_OTHER): Payer: BC Managed Care – PPO

## 2021-03-23 DIAGNOSIS — I442 Atrioventricular block, complete: Secondary | ICD-10-CM | POA: Diagnosis not present

## 2021-03-24 ENCOUNTER — Encounter: Payer: Self-pay | Admitting: Internal Medicine

## 2021-03-24 ENCOUNTER — Other Ambulatory Visit: Payer: Self-pay

## 2021-03-24 ENCOUNTER — Ambulatory Visit (INDEPENDENT_AMBULATORY_CARE_PROVIDER_SITE_OTHER): Payer: BC Managed Care – PPO | Admitting: Internal Medicine

## 2021-03-24 VITALS — BP 122/72 | HR 79 | Ht 64.0 in | Wt 248.0 lb

## 2021-03-24 DIAGNOSIS — I442 Atrioventricular block, complete: Secondary | ICD-10-CM | POA: Diagnosis not present

## 2021-03-24 DIAGNOSIS — I10 Essential (primary) hypertension: Secondary | ICD-10-CM | POA: Diagnosis not present

## 2021-03-24 DIAGNOSIS — Z23 Encounter for immunization: Secondary | ICD-10-CM | POA: Diagnosis not present

## 2021-03-24 NOTE — Progress Notes (Signed)
HPI Jordan Perry returns today for followup of CHB, s/p PPM insertion. She has morbid obesity and has done well since her PPM. She is currently undergoing dental implants. She had severe tooth carries. She denies chest pain or sob. She had lost weight but gained it back. She denies edema or sob . She is still working part time.  Allergies  Allergen Reactions  . Invokana [Canagliflozin] Other (See Comments)    Skin peeling  . Janumet [Sitagliptin-Metformin Hcl] Other (See Comments)    Skin peeling  . Cephalexin Other (See Comments)  . Penicillins     Has patient had a PCN reaction causing immediate rash, facial/tongue/throat swelling, SOB or lightheadedness with hypotension: Yes-immediate rash Has patient had a PCN reaction causing severe rash involving mucus membranes or skin necrosis: Yes Has patient had a PCN reaction that required hospitalization: No Has patient had a PCN reaction occurring within the last 10 years: No If all of the above answers are "NO", then may proceed with Cephalosporin use.   . Prednisone Rash     Current Outpatient Medications  Medication Sig Dispense Refill  . acetaminophen (TYLENOL) 500 MG tablet Take 1,000-1,500 mg by mouth as needed (for headaches.).     Marland Kitchen ALPRAZolam (XANAX) 0.25 MG tablet Take 0.25 mg by mouth 2 (two) times daily as needed.    Marland Kitchen aspirin EC 81 MG tablet Take 81 mg by mouth daily.    Marland Kitchen atorvastatin (LIPITOR) 80 MG tablet TAKE 1 TABLET AT BEDTIME 90 tablet 3  . ciprofloxacin (CILOXAN) 0.3 % ophthalmic solution Place 1 drop into the right eye in the morning and at bedtime.    . furosemide (LASIX) 20 MG tablet TAKE 1 TABLET AS NEEDED 90 tablet 1  . HUMULIN N KWIKPEN 100 UNIT/ML Kiwkpen Inject 40-60 Units into the skin See admin instructions. Inject 60 units subcutaneously in the morning & 40 units subcutaneously at night.    . insulin lispro (HUMALOG) 100 UNIT/ML KwikPen Inject 100 Units into the skin daily.    Marland Kitchen levocetirizine (XYZAL)  5 MG tablet Take 5 mg by mouth every evening.    Marland Kitchen lisinopril (ZESTRIL) 20 MG tablet TAKE 1 TABLET DAILY 90 tablet 3  . meloxicam (MOBIC) 7.5 MG tablet TAKE ONE TABLET BY MOUTH ONCE DAILY. 30 tablet 0  . metFORMIN (GLUCOPHAGE-XR) 500 MG 24 hr tablet Take 500 mg by mouth 2 (two) times daily after a meal.     . Multiple Vitamin (MULTIVITAMIN WITH MINERALS) TABS tablet Take 1 tablet by mouth daily. Centrum Silver Multivitamin    . Probiotic Product (ALIGN) 4 MG CAPS Take 1 capsule by mouth daily.    Marland Kitchen SYNTHROID 125 MCG tablet Take 125 mcg by mouth daily before breakfast.      No current facility-administered medications for this visit.     Past Medical History:  Diagnosis Date  . Allergic rhinitis, cause unspecified   . Arthritis   . Coronary atherosclerosis of unspecified type of vessel, native or graft   . GERD (gastroesophageal reflux disease)   . Hypothyroidism   . Proteinuria   . Pure hyperglyceridemia   . Type II or unspecified type diabetes mellitus without mention of complication, not stated as uncontrolled   . Unspecified essential hypertension     ROS:   All systems reviewed and negative except as noted in the HPI.   Past Surgical History:  Procedure Laterality Date  . COLONOSCOPY N/A 10/02/2019   Procedure: COLONOSCOPY;  Surgeon:  Rourk, Cristopher Estimable, MD;  [facility];  Service: Endoscopy;  Laterality: N/A;  9:30  . CYSTECTOMY  1989   removed from end of spine  . PACEMAKER IMPLANT N/A 12/13/2018   Procedure: PACEMAKER IMPLANT;  Surgeon: Evans Lance, MD;  Location: [place] [facility];  Service: Cardiovascular;  Laterality: N/A;  . POLYPECTOMY  10/02/2019   Procedure: POLYPECTOMY;  Surgeon: Daneil Dolin, MD;  [facility];  Service: Endoscopy;;  . [place]  . Right lens implant       Family History  Problem Relation Age of Onset  . COPD Mother 20  . Heart disease Father   . Cancer Father 65  . Coronary artery disease Other    . Diabetes Other   . Obesity Other   . Heart attack Brother 59  . Coronary artery disease Sister   . Hyperlipidemia Other   . Hypertension Other   . Colon cancer Neg Hx      Social History   Socioeconomic History  . Marital status: Married    Spouse name: Not on file  . Number of children: 0  . Years of education: Not on file  . Highest education level: Not on file  Occupational History  . Occupation: Producer, 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[employer]    Comment: [place], 13+ years  Tobacco Use  . Smoking status: Never Smoker  . Smokeless tobacco: Never Used  Vaping Use  . Vaping Use: Never used  Substance and Sexual Activity  . Alcohol use: No    Alcohol/week: 0.0 standard drinks  . Drug use: No  . Sexual activity: Not on file  Other Topics Concern  . Not on file  Social History Narrative  . Not on file   Social Determinants of Health   Financial Resource Strain: Not on file  Food Insecurity: Not on file  Transportation Needs: Not on file  Physical Activity: Not on file  Stress: Not on file  Social Connections: Not on file  Intimate Partner Violence: Not on file     BP 122/72   Pulse 79   Ht 5\' 4"  (1.626 m)   Wt 248 lb (112.5 kg)   SpO2 99%   BMI 42.57 kg/m   Physical Exam:  Morbidly obese appearing NAD HEENT: Unremarkable Neck:  No JVD, no thyromegally Lymphatics:  No adenopathy Back:  No CVA tenderness Lungs:  Clear with no wheezes HEART:  Regular rate rhythm, no murmurs, no rubs, no clicks Abd:  soft, positive bowel sounds, no organomegally, no rebound, no guarding Ext:  2 plus pulses, no edema, no cyanosis, no clubbing Skin:  No rashes no nodules Neuro:  CN II through XII intact, motor grossly intact   DEVICE  Normal device function.  See PaceArt for details.   Assess/Plan: 1. CHB - she is actually conducting some today. She is asymptomatic, s/p PPM insertion. 2. Obesity - I encouraged her to lose weight. Once she gets her dental implants, I  encouraged her to eat more fruit 3.PPM - her  St. Jude DDD PM has over 10 years of battery longevity. 4. HTN - her bp is well controlled on medical therapy.  [name],MD

## 2021-03-24 NOTE — Patient Instructions (Signed)
Medication Instructions:  Your physician recommends that you continue on your current medications as directed. Please refer to the Current Medication list given to you today.  *If you need a refill on your cardiac medications before your next appointment, please call your pharmacy*   Lab Work: None If you have labs (blood work) drawn today and your tests are completely normal, you will receive your results only by: Marland Kitchen MyChart Message (if you have MyChart) OR . A paper copy in the mail If you have any lab test that is abnormal or we need to change your treatment, we will call you to review the results.   Testing/Procedures: None   Follow-Up: At [facility], you and your health needs are our priority.  As part of our continuing mission to provide you with exceptional heart care, we have created designated Provider Care Teams.  These Care Teams include your primary Cardiologist (physician) and Advanced Practice Providers (APPs -  Physician Assistants and Nurse Practitioners) who all work together to provide you with the care you need, when you need it.  We recommend signing up for the patient portal called "MyChart".  Sign up information is provided on this After Visit Summary.  MyChart is used to connect with patients for Virtual Visits (Telemedicine).  Patients are able to view lab/test results, encounter notes, upcoming appointments, etc.  Non-urgent messages can be sent to your provider as well.   To learn more about what you can do with MyChart, go to [email or web address].    Your next appointment:   12 month(s)  The format for your next appointment:   In Person  Provider:   Cristopher Peru, MD   Other Instructions

## 2021-03-25 DIAGNOSIS — M9901 Segmental and somatic dysfunction of cervical region: Secondary | ICD-10-CM | POA: Diagnosis not present

## 2021-03-25 DIAGNOSIS — M5033 Other cervical disc degeneration, cervicothoracic region: Secondary | ICD-10-CM | POA: Diagnosis not present

## 2021-04-01 DIAGNOSIS — M9901 Segmental and somatic dysfunction of cervical region: Secondary | ICD-10-CM | POA: Diagnosis not present

## 2021-04-01 DIAGNOSIS — M5033 Other cervical disc degeneration, cervicothoracic region: Secondary | ICD-10-CM | POA: Diagnosis not present

## 2021-04-08 DIAGNOSIS — H35371 Puckering of macula, right eye: Secondary | ICD-10-CM | POA: Diagnosis not present

## 2021-04-08 DIAGNOSIS — H179 Unspecified corneal scar and opacity: Secondary | ICD-10-CM | POA: Diagnosis not present

## 2021-04-08 DIAGNOSIS — M9901 Segmental and somatic dysfunction of cervical region: Secondary | ICD-10-CM | POA: Diagnosis not present

## 2021-04-08 DIAGNOSIS — H4051X2 Glaucoma secondary to other eye disorders, right eye, moderate stage: Secondary | ICD-10-CM | POA: Diagnosis not present

## 2021-04-08 DIAGNOSIS — M5033 Other cervical disc degeneration, cervicothoracic region: Secondary | ICD-10-CM | POA: Diagnosis not present

## 2021-04-08 DIAGNOSIS — E113313 Type 2 diabetes mellitus with moderate nonproliferative diabetic retinopathy with macular edema, bilateral: Secondary | ICD-10-CM | POA: Diagnosis not present

## 2021-04-14 DIAGNOSIS — M9901 Segmental and somatic dysfunction of cervical region: Secondary | ICD-10-CM | POA: Diagnosis not present

## 2021-04-14 DIAGNOSIS — M5033 Other cervical disc degeneration, cervicothoracic region: Secondary | ICD-10-CM | POA: Diagnosis not present

## 2021-04-14 DIAGNOSIS — M65341 Trigger finger, right ring finger: Secondary | ICD-10-CM | POA: Diagnosis not present

## 2021-04-14 NOTE — Progress Notes (Signed)
Remote pacemaker transmission.   

## 2021-04-20 DIAGNOSIS — M9901 Segmental and somatic dysfunction of cervical region: Secondary | ICD-10-CM | POA: Diagnosis not present

## 2021-04-20 DIAGNOSIS — M5033 Other cervical disc degeneration, cervicothoracic region: Secondary | ICD-10-CM | POA: Diagnosis not present

## 2021-04-27 DIAGNOSIS — R809 Proteinuria, unspecified: Secondary | ICD-10-CM | POA: Diagnosis not present

## 2021-04-27 DIAGNOSIS — E039 Hypothyroidism, unspecified: Secondary | ICD-10-CM | POA: Diagnosis not present

## 2021-04-27 DIAGNOSIS — I10 Essential (primary) hypertension: Secondary | ICD-10-CM | POA: Diagnosis not present

## 2021-04-27 DIAGNOSIS — E1165 Type 2 diabetes mellitus with hyperglycemia: Secondary | ICD-10-CM | POA: Diagnosis not present

## 2021-04-28 DIAGNOSIS — M5033 Other cervical disc degeneration, cervicothoracic region: Secondary | ICD-10-CM | POA: Diagnosis not present

## 2021-04-28 DIAGNOSIS — M9901 Segmental and somatic dysfunction of cervical region: Secondary | ICD-10-CM | POA: Diagnosis not present

## 2021-05-05 ENCOUNTER — Other Ambulatory Visit: Payer: Self-pay | Admitting: Cardiology

## 2021-05-06 DIAGNOSIS — M5033 Other cervical disc degeneration, cervicothoracic region: Secondary | ICD-10-CM | POA: Diagnosis not present

## 2021-05-06 DIAGNOSIS — M9901 Segmental and somatic dysfunction of cervical region: Secondary | ICD-10-CM | POA: Diagnosis not present

## 2021-05-13 DIAGNOSIS — M9901 Segmental and somatic dysfunction of cervical region: Secondary | ICD-10-CM | POA: Diagnosis not present

## 2021-05-13 DIAGNOSIS — M5033 Other cervical disc degeneration, cervicothoracic region: Secondary | ICD-10-CM | POA: Diagnosis not present

## 2021-05-17 DIAGNOSIS — H35371 Puckering of macula, right eye: Secondary | ICD-10-CM | POA: Diagnosis not present

## 2021-05-17 DIAGNOSIS — E113313 Type 2 diabetes mellitus with moderate nonproliferative diabetic retinopathy with macular edema, bilateral: Secondary | ICD-10-CM | POA: Diagnosis not present

## 2021-05-17 DIAGNOSIS — H4051X2 Glaucoma secondary to other eye disorders, right eye, moderate stage: Secondary | ICD-10-CM | POA: Diagnosis not present

## 2021-05-19 DIAGNOSIS — M65341 Trigger finger, right ring finger: Secondary | ICD-10-CM | POA: Diagnosis not present

## 2021-05-20 DIAGNOSIS — M9901 Segmental and somatic dysfunction of cervical region: Secondary | ICD-10-CM | POA: Diagnosis not present

## 2021-05-20 DIAGNOSIS — M5033 Other cervical disc degeneration, cervicothoracic region: Secondary | ICD-10-CM | POA: Diagnosis not present

## 2021-05-24 DIAGNOSIS — Z23 Encounter for immunization: Secondary | ICD-10-CM | POA: Diagnosis not present

## 2021-05-27 DIAGNOSIS — M5033 Other cervical disc degeneration, cervicothoracic region: Secondary | ICD-10-CM | POA: Diagnosis not present

## 2021-05-27 DIAGNOSIS — M9901 Segmental and somatic dysfunction of cervical region: Secondary | ICD-10-CM | POA: Diagnosis not present

## 2021-06-17 DIAGNOSIS — M5033 Other cervical disc degeneration, cervicothoracic region: Secondary | ICD-10-CM | POA: Diagnosis not present

## 2021-06-17 DIAGNOSIS — M9901 Segmental and somatic dysfunction of cervical region: Secondary | ICD-10-CM | POA: Diagnosis not present

## 2021-06-21 ENCOUNTER — Ambulatory Visit (INDEPENDENT_AMBULATORY_CARE_PROVIDER_SITE_OTHER): Payer: BC Managed Care – PPO

## 2021-06-21 DIAGNOSIS — E113313 Type 2 diabetes mellitus with moderate nonproliferative diabetic retinopathy with macular edema, bilateral: Secondary | ICD-10-CM | POA: Diagnosis not present

## 2021-06-21 DIAGNOSIS — M9901 Segmental and somatic dysfunction of cervical region: Secondary | ICD-10-CM | POA: Diagnosis not present

## 2021-06-21 DIAGNOSIS — H35371 Puckering of macula, right eye: Secondary | ICD-10-CM | POA: Diagnosis not present

## 2021-06-21 DIAGNOSIS — I442 Atrioventricular block, complete: Secondary | ICD-10-CM | POA: Diagnosis not present

## 2021-06-21 DIAGNOSIS — H4051X2 Glaucoma secondary to other eye disorders, right eye, moderate stage: Secondary | ICD-10-CM | POA: Diagnosis not present

## 2021-06-21 DIAGNOSIS — M5033 Other cervical disc degeneration, cervicothoracic region: Secondary | ICD-10-CM | POA: Diagnosis not present

## 2021-06-21 LAB — CUP PACEART REMOTE DEVICE CHECK
Battery Remaining Longevity: 98 mo
Battery Remaining Percentage: 75 %
Battery Voltage: 3.01 V
Brady Statistic AP VP Percent: 1 %
Brady Statistic AP VS Percent: 1.4 %
Brady Statistic AS VP Percent: 2.4 %
Brady Statistic AS VS Percent: 96 %
Brady Statistic RA Percent Paced: 1.3 %
Brady Statistic RV Percent Paced: 2.5 %
Date Time Interrogation Session: 20220829020014
Implantable Lead Implant Date: 20200220
Implantable Lead Implant Date: 20200220
Implantable Lead Location: 753859
Implantable Lead Location: 753860
Implantable Pulse Generator Implant Date: 20200220
Lead Channel Impedance Value: 460 Ohm
Lead Channel Impedance Value: 480 Ohm
Lead Channel Pacing Threshold Amplitude: 0.375 V
Lead Channel Pacing Threshold Amplitude: 0.75 V
Lead Channel Pacing Threshold Pulse Width: 0.5 ms
Lead Channel Pacing Threshold Pulse Width: 0.5 ms
Lead Channel Sensing Intrinsic Amplitude: 12 mV
Lead Channel Sensing Intrinsic Amplitude: 3.4 mV
Lead Channel Setting Pacing Amplitude: 1 V
Lead Channel Setting Pacing Amplitude: 1.375
Lead Channel Setting Pacing Pulse Width: 0.5 ms
Lead Channel Setting Sensing Sensitivity: 4 mV
Pulse Gen Model: 2272
Pulse Gen Serial Number: 9107223

## 2021-06-24 DIAGNOSIS — E113213 Type 2 diabetes mellitus with mild nonproliferative diabetic retinopathy with macular edema, bilateral: Secondary | ICD-10-CM | POA: Diagnosis not present

## 2021-06-24 DIAGNOSIS — T85398A Other mechanical complication of other ocular prosthetic devices, implants and grafts, initial encounter: Secondary | ICD-10-CM | POA: Diagnosis not present

## 2021-06-24 DIAGNOSIS — H209 Unspecified iridocyclitis: Secondary | ICD-10-CM | POA: Diagnosis not present

## 2021-06-24 DIAGNOSIS — H4051X3 Glaucoma secondary to other eye disorders, right eye, severe stage: Secondary | ICD-10-CM | POA: Diagnosis not present

## 2021-07-01 DIAGNOSIS — M5033 Other cervical disc degeneration, cervicothoracic region: Secondary | ICD-10-CM | POA: Diagnosis not present

## 2021-07-01 DIAGNOSIS — M9901 Segmental and somatic dysfunction of cervical region: Secondary | ICD-10-CM | POA: Diagnosis not present

## 2021-07-01 NOTE — Progress Notes (Signed)
Remote pacemaker transmission.   

## 2021-07-08 DIAGNOSIS — M9901 Segmental and somatic dysfunction of cervical region: Secondary | ICD-10-CM | POA: Diagnosis not present

## 2021-07-08 DIAGNOSIS — M5033 Other cervical disc degeneration, cervicothoracic region: Secondary | ICD-10-CM | POA: Diagnosis not present

## 2021-07-15 ENCOUNTER — Ambulatory Visit (INDEPENDENT_AMBULATORY_CARE_PROVIDER_SITE_OTHER): Payer: BC Managed Care – PPO | Admitting: Orthopedic Surgery

## 2021-07-15 ENCOUNTER — Other Ambulatory Visit: Payer: Self-pay

## 2021-07-15 ENCOUNTER — Encounter: Payer: Self-pay | Admitting: Orthopedic Surgery

## 2021-07-15 ENCOUNTER — Ambulatory Visit: Payer: BC Managed Care – PPO

## 2021-07-15 VITALS — BP 124/51 | HR 71 | Ht 64.0 in | Wt 251.1 lb

## 2021-07-15 DIAGNOSIS — M25561 Pain in right knee: Secondary | ICD-10-CM | POA: Diagnosis not present

## 2021-07-15 DIAGNOSIS — M7542 Impingement syndrome of left shoulder: Secondary | ICD-10-CM | POA: Diagnosis not present

## 2021-07-15 DIAGNOSIS — M542 Cervicalgia: Secondary | ICD-10-CM | POA: Diagnosis not present

## 2021-07-15 DIAGNOSIS — G8929 Other chronic pain: Secondary | ICD-10-CM | POA: Diagnosis not present

## 2021-07-15 DIAGNOSIS — M5033 Other cervical disc degeneration, cervicothoracic region: Secondary | ICD-10-CM | POA: Diagnosis not present

## 2021-07-15 DIAGNOSIS — M25512 Pain in left shoulder: Secondary | ICD-10-CM | POA: Diagnosis not present

## 2021-07-15 DIAGNOSIS — M9901 Segmental and somatic dysfunction of cervical region: Secondary | ICD-10-CM | POA: Diagnosis not present

## 2021-07-15 MED ORDER — MELOXICAM 7.5 MG PO TABS: 7.5000 mg | ORAL_TABLET | Freq: Every day | ORAL | 0 refills | Status: DC

## 2021-07-15 NOTE — Progress Notes (Signed)
Chief Complaint  Patient presents with   Knee Pain    R/when I move my knee feels like its grinding, this has been going on for about 2 months now.   65 year old female presents with 2 months of pain in the front of her knee with painful grinding and difficulty stair climbing denies any injury  Right knee exam no effusion noted skin is intact no redness no erythema no rash.  Tenderness in the parapatellar region and medial joint line with normal range of motion good ligament exam and normal motor function.  Neurovascular exam is intact  X-rays in the office show patellofemoral arthritis seems to have normal tibiofemoral articulations  Recommend meloxicam plus injection.  Follow-up with [place] as needed  Encounter Diagnoses  Name Primary?   Chronic pain of right knee Yes   Cervicalgia    Chronic left shoulder pain    Impingement syndrome of left shoulder    Meds ordered this encounter  Medications   meloxicam (MOBIC) 7.5 MG tablet    Sig: Take 1 tablet (7.5 mg total) by mouth daily.    Dispense:  30 tablet    Refill:  0   Injection right knee joint  Medication Celestone 6 mg, lidocaine 1% 4 cc  Approach anterolateral with knee flexed  Consent given by patient verbally  Timeout completed  After skin prep with ethyl chloride and alcohol we injected through the lateral portal no complications

## 2021-07-19 DIAGNOSIS — M9901 Segmental and somatic dysfunction of cervical region: Secondary | ICD-10-CM | POA: Diagnosis not present

## 2021-07-19 DIAGNOSIS — M5033 Other cervical disc degeneration, cervicothoracic region: Secondary | ICD-10-CM | POA: Diagnosis not present

## 2021-07-19 DIAGNOSIS — H4051X2 Glaucoma secondary to other eye disorders, right eye, moderate stage: Secondary | ICD-10-CM | POA: Diagnosis not present

## 2021-07-19 DIAGNOSIS — E113313 Type 2 diabetes mellitus with moderate nonproliferative diabetic retinopathy with macular edema, bilateral: Secondary | ICD-10-CM | POA: Diagnosis not present

## 2021-07-19 DIAGNOSIS — H35371 Puckering of macula, right eye: Secondary | ICD-10-CM | POA: Diagnosis not present

## 2021-07-27 DIAGNOSIS — M9901 Segmental and somatic dysfunction of cervical region: Secondary | ICD-10-CM | POA: Diagnosis not present

## 2021-07-27 DIAGNOSIS — M5033 Other cervical disc degeneration, cervicothoracic region: Secondary | ICD-10-CM | POA: Diagnosis not present

## 2021-08-05 DIAGNOSIS — H26491 Other secondary cataract, right eye: Secondary | ICD-10-CM | POA: Diagnosis not present

## 2021-08-05 DIAGNOSIS — M9901 Segmental and somatic dysfunction of cervical region: Secondary | ICD-10-CM | POA: Diagnosis not present

## 2021-08-05 DIAGNOSIS — M5033 Other cervical disc degeneration, cervicothoracic region: Secondary | ICD-10-CM | POA: Diagnosis not present

## 2021-08-10 DIAGNOSIS — M9901 Segmental and somatic dysfunction of cervical region: Secondary | ICD-10-CM | POA: Diagnosis not present

## 2021-08-10 DIAGNOSIS — M5033 Other cervical disc degeneration, cervicothoracic region: Secondary | ICD-10-CM | POA: Diagnosis not present

## 2021-08-16 DIAGNOSIS — H35371 Puckering of macula, right eye: Secondary | ICD-10-CM | POA: Diagnosis not present

## 2021-08-16 DIAGNOSIS — H4051X2 Glaucoma secondary to other eye disorders, right eye, moderate stage: Secondary | ICD-10-CM | POA: Diagnosis not present

## 2021-08-16 DIAGNOSIS — E113313 Type 2 diabetes mellitus with moderate nonproliferative diabetic retinopathy with macular edema, bilateral: Secondary | ICD-10-CM | POA: Diagnosis not present

## 2021-08-17 DIAGNOSIS — L820 Inflamed seborrheic keratosis: Secondary | ICD-10-CM | POA: Diagnosis not present

## 2021-08-17 DIAGNOSIS — D225 Melanocytic nevi of trunk: Secondary | ICD-10-CM | POA: Diagnosis not present

## 2021-08-17 DIAGNOSIS — D485 Neoplasm of uncertain behavior of skin: Secondary | ICD-10-CM | POA: Diagnosis not present

## 2021-08-17 DIAGNOSIS — D2272 Melanocytic nevi of left lower limb, including hip: Secondary | ICD-10-CM | POA: Diagnosis not present

## 2021-08-17 DIAGNOSIS — Z1283 Encounter for screening for malignant neoplasm of skin: Secondary | ICD-10-CM | POA: Diagnosis not present

## 2021-08-19 DIAGNOSIS — M9901 Segmental and somatic dysfunction of cervical region: Secondary | ICD-10-CM | POA: Diagnosis not present

## 2021-08-19 DIAGNOSIS — M5033 Other cervical disc degeneration, cervicothoracic region: Secondary | ICD-10-CM | POA: Diagnosis not present

## 2021-08-24 DIAGNOSIS — E7800 Pure hypercholesterolemia, unspecified: Secondary | ICD-10-CM | POA: Diagnosis not present

## 2021-08-24 DIAGNOSIS — E1165 Type 2 diabetes mellitus with hyperglycemia: Secondary | ICD-10-CM | POA: Diagnosis not present

## 2021-08-24 DIAGNOSIS — E039 Hypothyroidism, unspecified: Secondary | ICD-10-CM | POA: Diagnosis not present

## 2021-09-02 DIAGNOSIS — M5033 Other cervical disc degeneration, cervicothoracic region: Secondary | ICD-10-CM | POA: Diagnosis not present

## 2021-09-02 DIAGNOSIS — M9901 Segmental and somatic dysfunction of cervical region: Secondary | ICD-10-CM | POA: Diagnosis not present

## 2021-09-07 DIAGNOSIS — I10 Essential (primary) hypertension: Secondary | ICD-10-CM | POA: Diagnosis not present

## 2021-09-07 DIAGNOSIS — E1165 Type 2 diabetes mellitus with hyperglycemia: Secondary | ICD-10-CM | POA: Diagnosis not present

## 2021-09-07 DIAGNOSIS — R809 Proteinuria, unspecified: Secondary | ICD-10-CM | POA: Diagnosis not present

## 2021-09-07 DIAGNOSIS — E039 Hypothyroidism, unspecified: Secondary | ICD-10-CM | POA: Diagnosis not present

## 2021-09-09 DIAGNOSIS — M5033 Other cervical disc degeneration, cervicothoracic region: Secondary | ICD-10-CM | POA: Diagnosis not present

## 2021-09-09 DIAGNOSIS — M9901 Segmental and somatic dysfunction of cervical region: Secondary | ICD-10-CM | POA: Diagnosis not present

## 2021-09-13 DIAGNOSIS — H4051X2 Glaucoma secondary to other eye disorders, right eye, moderate stage: Secondary | ICD-10-CM | POA: Diagnosis not present

## 2021-09-13 DIAGNOSIS — E113313 Type 2 diabetes mellitus with moderate nonproliferative diabetic retinopathy with macular edema, bilateral: Secondary | ICD-10-CM | POA: Diagnosis not present

## 2021-09-13 DIAGNOSIS — H35371 Puckering of macula, right eye: Secondary | ICD-10-CM | POA: Diagnosis not present

## 2021-09-13 DIAGNOSIS — H401122 Primary open-angle glaucoma, left eye, moderate stage: Secondary | ICD-10-CM | POA: Diagnosis not present

## 2021-09-14 DIAGNOSIS — M9901 Segmental and somatic dysfunction of cervical region: Secondary | ICD-10-CM | POA: Diagnosis not present

## 2021-09-14 DIAGNOSIS — M5033 Other cervical disc degeneration, cervicothoracic region: Secondary | ICD-10-CM | POA: Diagnosis not present

## 2021-09-20 ENCOUNTER — Ambulatory Visit (INDEPENDENT_AMBULATORY_CARE_PROVIDER_SITE_OTHER): Payer: BC Managed Care – PPO

## 2021-09-20 DIAGNOSIS — I442 Atrioventricular block, complete: Secondary | ICD-10-CM | POA: Diagnosis not present

## 2021-09-20 LAB — CUP PACEART REMOTE DEVICE CHECK
Battery Remaining Longevity: 95 mo
Battery Remaining Percentage: 73 %
Battery Voltage: 3.01 V
Brady Statistic AP VP Percent: 1 %
Brady Statistic AP VS Percent: 2.1 %
Brady Statistic AS VP Percent: 10 %
Brady Statistic AS VS Percent: 87 %
Brady Statistic RA Percent Paced: 2 %
Brady Statistic RV Percent Paced: 11 %
Date Time Interrogation Session: 20221128023931
Implantable Lead Implant Date: 20200220
Implantable Lead Implant Date: 20200220
Implantable Lead Location: 753859
Implantable Lead Location: 753860
Implantable Pulse Generator Implant Date: 20200220
Lead Channel Impedance Value: 460 Ohm
Lead Channel Impedance Value: 460 Ohm
Lead Channel Pacing Threshold Amplitude: 0.375 V
Lead Channel Pacing Threshold Amplitude: 0.75 V
Lead Channel Pacing Threshold Pulse Width: 0.5 ms
Lead Channel Pacing Threshold Pulse Width: 0.5 ms
Lead Channel Sensing Intrinsic Amplitude: 4 mV
Lead Channel Sensing Intrinsic Amplitude: 9.6 mV
Lead Channel Setting Pacing Amplitude: 1 V
Lead Channel Setting Pacing Amplitude: 1.375
Lead Channel Setting Pacing Pulse Width: 0.5 ms
Lead Channel Setting Sensing Sensitivity: 4 mV
Pulse Gen Model: 2272
Pulse Gen Serial Number: 9107223

## 2021-09-21 ENCOUNTER — Other Ambulatory Visit: Payer: Self-pay | Admitting: Cardiology

## 2021-09-23 DIAGNOSIS — M5033 Other cervical disc degeneration, cervicothoracic region: Secondary | ICD-10-CM | POA: Diagnosis not present

## 2021-09-23 DIAGNOSIS — M9901 Segmental and somatic dysfunction of cervical region: Secondary | ICD-10-CM | POA: Diagnosis not present

## 2021-09-28 NOTE — Progress Notes (Signed)
Remote pacemaker transmission.   

## 2021-09-29 DIAGNOSIS — M9901 Segmental and somatic dysfunction of cervical region: Secondary | ICD-10-CM | POA: Diagnosis not present

## 2021-09-29 DIAGNOSIS — M5033 Other cervical disc degeneration, cervicothoracic region: Secondary | ICD-10-CM | POA: Diagnosis not present

## 2021-10-04 DIAGNOSIS — M5033 Other cervical disc degeneration, cervicothoracic region: Secondary | ICD-10-CM | POA: Diagnosis not present

## 2021-10-04 DIAGNOSIS — T85398A Other mechanical complication of other ocular prosthetic devices, implants and grafts, initial encounter: Secondary | ICD-10-CM | POA: Diagnosis not present

## 2021-10-04 DIAGNOSIS — H4041X0 Glaucoma secondary to eye inflammation, right eye, stage unspecified: Secondary | ICD-10-CM | POA: Diagnosis not present

## 2021-10-04 DIAGNOSIS — H40022 Open angle with borderline findings, high risk, left eye: Secondary | ICD-10-CM | POA: Diagnosis not present

## 2021-10-04 DIAGNOSIS — H209 Unspecified iridocyclitis: Secondary | ICD-10-CM | POA: Diagnosis not present

## 2021-10-04 DIAGNOSIS — M9901 Segmental and somatic dysfunction of cervical region: Secondary | ICD-10-CM | POA: Diagnosis not present

## 2021-10-21 DIAGNOSIS — M9901 Segmental and somatic dysfunction of cervical region: Secondary | ICD-10-CM | POA: Diagnosis not present

## 2021-10-21 DIAGNOSIS — M5033 Other cervical disc degeneration, cervicothoracic region: Secondary | ICD-10-CM | POA: Diagnosis not present

## 2021-10-26 DIAGNOSIS — H401122 Primary open-angle glaucoma, left eye, moderate stage: Secondary | ICD-10-CM | POA: Diagnosis not present

## 2021-10-26 DIAGNOSIS — E113313 Type 2 diabetes mellitus with moderate nonproliferative diabetic retinopathy with macular edema, bilateral: Secondary | ICD-10-CM | POA: Diagnosis not present

## 2021-10-26 DIAGNOSIS — H35371 Puckering of macula, right eye: Secondary | ICD-10-CM | POA: Diagnosis not present

## 2021-10-28 DIAGNOSIS — M9901 Segmental and somatic dysfunction of cervical region: Secondary | ICD-10-CM | POA: Diagnosis not present

## 2021-10-28 DIAGNOSIS — M5033 Other cervical disc degeneration, cervicothoracic region: Secondary | ICD-10-CM | POA: Diagnosis not present

## 2021-11-04 DIAGNOSIS — M5033 Other cervical disc degeneration, cervicothoracic region: Secondary | ICD-10-CM | POA: Diagnosis not present

## 2021-11-04 DIAGNOSIS — M9901 Segmental and somatic dysfunction of cervical region: Secondary | ICD-10-CM | POA: Diagnosis not present

## 2021-11-11 DIAGNOSIS — M9901 Segmental and somatic dysfunction of cervical region: Secondary | ICD-10-CM | POA: Diagnosis not present

## 2021-11-11 DIAGNOSIS — M5033 Other cervical disc degeneration, cervicothoracic region: Secondary | ICD-10-CM | POA: Diagnosis not present

## 2021-11-18 DIAGNOSIS — M9901 Segmental and somatic dysfunction of cervical region: Secondary | ICD-10-CM | POA: Diagnosis not present

## 2021-11-18 DIAGNOSIS — M5033 Other cervical disc degeneration, cervicothoracic region: Secondary | ICD-10-CM | POA: Diagnosis not present

## 2021-11-25 DIAGNOSIS — M5033 Other cervical disc degeneration, cervicothoracic region: Secondary | ICD-10-CM | POA: Diagnosis not present

## 2021-11-25 DIAGNOSIS — M9901 Segmental and somatic dysfunction of cervical region: Secondary | ICD-10-CM | POA: Diagnosis not present

## 2021-12-02 DIAGNOSIS — M5033 Other cervical disc degeneration, cervicothoracic region: Secondary | ICD-10-CM | POA: Diagnosis not present

## 2021-12-02 DIAGNOSIS — M9901 Segmental and somatic dysfunction of cervical region: Secondary | ICD-10-CM | POA: Diagnosis not present

## 2021-12-06 DIAGNOSIS — Z794 Long term (current) use of insulin: Secondary | ICD-10-CM | POA: Diagnosis not present

## 2021-12-06 DIAGNOSIS — E113213 Type 2 diabetes mellitus with mild nonproliferative diabetic retinopathy with macular edema, bilateral: Secondary | ICD-10-CM | POA: Diagnosis not present

## 2021-12-06 DIAGNOSIS — H209 Unspecified iridocyclitis: Secondary | ICD-10-CM | POA: Diagnosis not present

## 2021-12-06 DIAGNOSIS — T85398A Other mechanical complication of other ocular prosthetic devices, implants and grafts, initial encounter: Secondary | ICD-10-CM | POA: Diagnosis not present

## 2021-12-08 DIAGNOSIS — R809 Proteinuria, unspecified: Secondary | ICD-10-CM | POA: Diagnosis not present

## 2021-12-08 DIAGNOSIS — E039 Hypothyroidism, unspecified: Secondary | ICD-10-CM | POA: Diagnosis not present

## 2021-12-08 DIAGNOSIS — E1165 Type 2 diabetes mellitus with hyperglycemia: Secondary | ICD-10-CM | POA: Diagnosis not present

## 2021-12-08 DIAGNOSIS — I10 Essential (primary) hypertension: Secondary | ICD-10-CM | POA: Diagnosis not present

## 2021-12-09 DIAGNOSIS — M9901 Segmental and somatic dysfunction of cervical region: Secondary | ICD-10-CM | POA: Diagnosis not present

## 2021-12-09 DIAGNOSIS — M5033 Other cervical disc degeneration, cervicothoracic region: Secondary | ICD-10-CM | POA: Diagnosis not present

## 2021-12-13 DIAGNOSIS — M5033 Other cervical disc degeneration, cervicothoracic region: Secondary | ICD-10-CM | POA: Diagnosis not present

## 2021-12-13 DIAGNOSIS — M9901 Segmental and somatic dysfunction of cervical region: Secondary | ICD-10-CM | POA: Diagnosis not present

## 2021-12-20 ENCOUNTER — Ambulatory Visit (INDEPENDENT_AMBULATORY_CARE_PROVIDER_SITE_OTHER): Payer: BC Managed Care – PPO

## 2021-12-20 DIAGNOSIS — I442 Atrioventricular block, complete: Secondary | ICD-10-CM

## 2021-12-20 LAB — CUP PACEART REMOTE DEVICE CHECK
Battery Remaining Longevity: 91 mo
Battery Remaining Percentage: 70 %
Battery Voltage: 3.01 V
Brady Statistic AP VP Percent: 1 %
Brady Statistic AP VS Percent: 1.5 %
Brady Statistic AS VP Percent: 31 %
Brady Statistic AS VS Percent: 67 %
Brady Statistic RA Percent Paced: 1.4 %
Brady Statistic RV Percent Paced: 32 %
Date Time Interrogation Session: 20230227031958
Implantable Lead Implant Date: 20200220
Implantable Lead Implant Date: 20200220
Implantable Lead Location: 753859
Implantable Lead Location: 753860
Implantable Pulse Generator Implant Date: 20200220
Lead Channel Impedance Value: 460 Ohm
Lead Channel Impedance Value: 530 Ohm
Lead Channel Pacing Threshold Amplitude: 0.375 V
Lead Channel Pacing Threshold Amplitude: 0.75 V
Lead Channel Pacing Threshold Pulse Width: 0.5 ms
Lead Channel Pacing Threshold Pulse Width: 0.5 ms
Lead Channel Sensing Intrinsic Amplitude: 12 mV
Lead Channel Sensing Intrinsic Amplitude: 3 mV
Lead Channel Setting Pacing Amplitude: 1 V
Lead Channel Setting Pacing Amplitude: 1.375
Lead Channel Setting Pacing Pulse Width: 0.5 ms
Lead Channel Setting Sensing Sensitivity: 4 mV
Pulse Gen Model: 2272
Pulse Gen Serial Number: 9107223

## 2021-12-23 DIAGNOSIS — M9901 Segmental and somatic dysfunction of cervical region: Secondary | ICD-10-CM | POA: Diagnosis not present

## 2021-12-23 DIAGNOSIS — M5033 Other cervical disc degeneration, cervicothoracic region: Secondary | ICD-10-CM | POA: Diagnosis not present

## 2021-12-24 NOTE — Progress Notes (Signed)
Remote pacemaker transmission.   

## 2021-12-28 DIAGNOSIS — M5033 Other cervical disc degeneration, cervicothoracic region: Secondary | ICD-10-CM | POA: Diagnosis not present

## 2021-12-28 DIAGNOSIS — M9901 Segmental and somatic dysfunction of cervical region: Secondary | ICD-10-CM | POA: Diagnosis not present

## 2022-01-03 DIAGNOSIS — M9901 Segmental and somatic dysfunction of cervical region: Secondary | ICD-10-CM | POA: Diagnosis not present

## 2022-01-03 DIAGNOSIS — M5033 Other cervical disc degeneration, cervicothoracic region: Secondary | ICD-10-CM | POA: Diagnosis not present

## 2022-01-12 DIAGNOSIS — M5033 Other cervical disc degeneration, cervicothoracic region: Secondary | ICD-10-CM | POA: Diagnosis not present

## 2022-01-12 DIAGNOSIS — M9901 Segmental and somatic dysfunction of cervical region: Secondary | ICD-10-CM | POA: Diagnosis not present

## 2022-01-20 DIAGNOSIS — M5033 Other cervical disc degeneration, cervicothoracic region: Secondary | ICD-10-CM | POA: Diagnosis not present

## 2022-01-20 DIAGNOSIS — M9901 Segmental and somatic dysfunction of cervical region: Secondary | ICD-10-CM | POA: Diagnosis not present

## 2022-01-27 DIAGNOSIS — M5033 Other cervical disc degeneration, cervicothoracic region: Secondary | ICD-10-CM | POA: Diagnosis not present

## 2022-01-27 DIAGNOSIS — H4051X2 Glaucoma secondary to other eye disorders, right eye, moderate stage: Secondary | ICD-10-CM | POA: Diagnosis not present

## 2022-01-27 DIAGNOSIS — M9901 Segmental and somatic dysfunction of cervical region: Secondary | ICD-10-CM | POA: Diagnosis not present

## 2022-01-27 DIAGNOSIS — H35371 Puckering of macula, right eye: Secondary | ICD-10-CM | POA: Diagnosis not present

## 2022-01-27 DIAGNOSIS — H401122 Primary open-angle glaucoma, left eye, moderate stage: Secondary | ICD-10-CM | POA: Diagnosis not present

## 2022-01-27 DIAGNOSIS — E113313 Type 2 diabetes mellitus with moderate nonproliferative diabetic retinopathy with macular edema, bilateral: Secondary | ICD-10-CM | POA: Diagnosis not present

## 2022-02-03 DIAGNOSIS — M9901 Segmental and somatic dysfunction of cervical region: Secondary | ICD-10-CM | POA: Diagnosis not present

## 2022-02-03 DIAGNOSIS — M5033 Other cervical disc degeneration, cervicothoracic region: Secondary | ICD-10-CM | POA: Diagnosis not present

## 2022-02-07 DIAGNOSIS — M5033 Other cervical disc degeneration, cervicothoracic region: Secondary | ICD-10-CM | POA: Diagnosis not present

## 2022-02-07 DIAGNOSIS — M9901 Segmental and somatic dysfunction of cervical region: Secondary | ICD-10-CM | POA: Diagnosis not present

## 2022-02-17 DIAGNOSIS — M5033 Other cervical disc degeneration, cervicothoracic region: Secondary | ICD-10-CM | POA: Diagnosis not present

## 2022-02-17 DIAGNOSIS — M9901 Segmental and somatic dysfunction of cervical region: Secondary | ICD-10-CM | POA: Diagnosis not present

## 2022-02-24 DIAGNOSIS — M5033 Other cervical disc degeneration, cervicothoracic region: Secondary | ICD-10-CM | POA: Diagnosis not present

## 2022-02-24 DIAGNOSIS — M9901 Segmental and somatic dysfunction of cervical region: Secondary | ICD-10-CM | POA: Diagnosis not present

## 2022-03-01 DIAGNOSIS — M5033 Other cervical disc degeneration, cervicothoracic region: Secondary | ICD-10-CM | POA: Diagnosis not present

## 2022-03-01 DIAGNOSIS — M9901 Segmental and somatic dysfunction of cervical region: Secondary | ICD-10-CM | POA: Diagnosis not present

## 2022-03-10 DIAGNOSIS — M5033 Other cervical disc degeneration, cervicothoracic region: Secondary | ICD-10-CM | POA: Diagnosis not present

## 2022-03-10 DIAGNOSIS — M9901 Segmental and somatic dysfunction of cervical region: Secondary | ICD-10-CM | POA: Diagnosis not present

## 2022-03-11 ENCOUNTER — Encounter: Payer: Self-pay | Admitting: *Deleted

## 2022-03-11 ENCOUNTER — Encounter: Payer: Self-pay | Admitting: Cardiology

## 2022-03-11 ENCOUNTER — Ambulatory Visit: Payer: BC Managed Care – PPO | Admitting: Cardiology

## 2022-03-11 VITALS — BP 100/74 | HR 78 | Ht 64.0 in | Wt 221.0 lb

## 2022-03-11 DIAGNOSIS — Z8249 Family history of ischemic heart disease and other diseases of the circulatory system: Secondary | ICD-10-CM | POA: Diagnosis not present

## 2022-03-11 DIAGNOSIS — E782 Mixed hyperlipidemia: Secondary | ICD-10-CM | POA: Diagnosis not present

## 2022-03-11 DIAGNOSIS — I10 Essential (primary) hypertension: Secondary | ICD-10-CM

## 2022-03-11 DIAGNOSIS — I442 Atrioventricular block, complete: Secondary | ICD-10-CM

## 2022-03-11 MED ORDER — LISINOPRIL 10 MG PO TABS: 10.0000 mg | ORAL_TABLET | Freq: Every day | ORAL | 3 refills | Status: DC

## 2022-03-11 NOTE — Progress Notes (Signed)
Clinical Summary Ms. Blass is a 66 y.o.female seen today for follow up of the following medical problems.         1. Family history of CAD - normal stress myoview in 2009 - remote history of cath when seeing Dr Verl Blalock, no significant CAD    - no chest pains, no SOB/DOE   2. HTN - compliant with meds - occasional low bp's at home.  - 30 lbs weight loss since 06/2021. Started on moujaro by pcp - occasional lightheadedness   3. Hyperlipidemia   11/2020 TC 115 TG 164 HDL 35 LDL 52 - more recent labs with pcp   4. DM2 - followed by pcp   5. LE edema - no recent issues, has prn lasix as needed   6. Complete heart block - pacemaker followed by EP, placed 11/2018 - normal device function, 14 secs of afib    11/2021 normal device check - no recent symptoms.     SH: sister [name] who was pervious patient of mine who passed away Nov 30, 2015. Her husband Rickell Wiehe is also a patient of mine.   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 Past Medical History:  Diagnosis Date   Allergic rhinitis, cause unspecified    Arthritis    Coronary atherosclerosis of unspecified type of vessel, native or graft    GERD (gastroesophageal reflux disease)    Hypothyroidism    Proteinuria    Pure hyperglyceridemia    Type II or unspecified type diabetes mellitus without mention of complication, not stated as uncontrolled    Unspecified essential hypertension      Allergies  Allergen Reactions   Invokana [Canagliflozin] Other (See Comments)    Skin peeling   Janumet [Sitagliptin-Metformin Hcl] Other (See Comments)    Skin peeling   Cephalexin Other (See Comments)   Penicillins     Has patient had a PCN reaction causing immediate rash, facial/tongue/throat swelling, SOB or lightheadedness with hypotension: Yes-immediate rash Has patient had a PCN reaction causing severe rash involving mucus membranes or skin necrosis: Yes Has patient had a PCN reaction that required  hospitalization: No Has patient had a PCN reaction occurring within the last 10 years: No If all of the above answers are "NO", then may proceed with Cephalosporin use.    Prednisone Rash     Current Outpatient Medications  Medication Sig Dispense Refill   acetaminophen (TYLENOL) 500 MG tablet Take 1,000-1,500 mg by mouth as needed (for headaches.).      ALPRAZolam (XANAX) 0.25 MG tablet Take 0.25 mg by mouth 2 (two) times daily as needed.     aspirin EC 81 MG tablet Take 81 mg by mouth daily.     atorvastatin (LIPITOR) 80 MG tablet TAKE 1 TABLET AT BEDTIME 90 tablet 3   Bromfenac Sodium (PROLENSA) 0.07 % SOLN Apply 2 drops to eye daily in the afternoon.     ciprofloxacin (CILOXAN) 0.3 % ophthalmic solution Place 1 drop into the right eye in the morning and at bedtime. (Patient not taking: Reported on 07/15/2021)     furosemide (LASIX) 20 MG tablet TAKE 1 TABLET AS NEEDED 90 tablet 1   HUMULIN N KWIKPEN 100 UNIT/ML Kiwkpen Inject 40-60 Units into the skin See admin instructions. Inject 60 units subcutaneously in the morning & 40 units subcutaneously at night.     insulin lispro (HUMALOG) 100 UNIT/ML KwikPen Inject 100 Units into the skin daily.     levocetirizine (  XYZAL) 5 MG tablet Take 5 mg by mouth every evening.     lisinopril (ZESTRIL) 20 MG tablet TAKE 1 TABLET DAILY 90 tablet 3   meloxicam (MOBIC) 7.5 MG tablet Take 1 tablet (7.5 mg total) by mouth daily. 30 tablet 0   metFORMIN (GLUCOPHAGE-XR) 500 MG 24 hr tablet Take 500 mg by mouth 2 (two) times daily after a meal.      Multiple Vitamin (MULTIVITAMIN WITH MINERALS) TABS tablet Take 1 tablet by mouth daily. Centrum Silver Multivitamin     prednisoLONE acetate (PRED FORTE) 1 % ophthalmic suspension Place 1 drop into the right eye 2 (two) times daily.     Probiotic Product (ALIGN) 4 MG CAPS Take 1 capsule by mouth daily.     SYNTHROID 125 MCG tablet Take 125 mcg by mouth daily before breakfast.      No current facility-administered  medications for this visit.     Past Surgical History:  Procedure Laterality Date   COLONOSCOPY N/A 10/02/2019   Procedure: COLONOSCOPY;  Surgeon: Daneil Dolin, MD;  [facility];  Service: Endoscopy;  Laterality: N/A;  9:30   CYSTECTOMY  1989   removed from end of spine   PACEMAKER IMPLANT N/A 12/13/2018   Procedure: PACEMAKER IMPLANT;  Surgeon: Evans Lance, MD;  Location: [facility];  Service: Cardiovascular;  Laterality: N/A;   POLYPECTOMY  10/02/2019   Procedure: POLYPECTOMY;  Surgeon: Daneil Dolin, MD;  [facility]: [facility];  Service: Endoscopy;;   REFRACTIVE SURGERY  1994   Right lens implant       Allergies  Allergen Reactions   Invokana [Canagliflozin] Other (See Comments)    Skin peeling   Janumet [Sitagliptin-Metformin Hcl] Other (See Comments)    Skin peeling   Cephalexin Other (See Comments)   Penicillins     Has patient had a PCN reaction causing immediate rash, facial/tongue/throat swelling, SOB or lightheadedness with hypotension: Yes-immediate rash Has patient had a PCN reaction causing severe rash involving mucus membranes or skin necrosis: Yes Has patient had a PCN reaction that required hospitalization: No Has patient had a PCN reaction occurring within the last 10 years: No If all of the above answers are "NO", then may proceed with Cephalosporin use.    Prednisone Rash      Family History  Problem Relation Age of Onset   COPD Mother 62   Heart disease Father    Cancer Father 68   Coronary artery disease Other    Diabetes Other    Obesity Other    Heart attack Brother 12   Coronary artery disease Sister    Hyperlipidemia Other    Hypertension Other    Colon cancer Neg Hx      Social History Ms. Birkeland reports that she has never smoked. She has never used smokeless tobacco. Ms. Hickson reports no history of alcohol use.   Review of Systems CONSTITUTIONAL: No weight loss, fever, chills, weakness or fatigue.   HEENT: Eyes: No visual loss, blurred vision, double vision or yellow sclerae.No hearing loss, sneezing, congestion, runny nose or sore throat.  SKIN: No rash or itching.  CARDIOVASCULAR: per hpi RESPIRATORY: No shortness of breath, cough or sputum.  GASTROINTESTINAL: No anorexia, nausea, vomiting or diarrhea. No abdominal pain or blood.  GENITOURINARY: No burning on urination, no polyuria NEUROLOGICAL: No headache, dizziness, syncope, paralysis, ataxia, numbness or tingling in the extremities. No change in bowel or bladder control.  MUSCULOSKELETAL: No muscle, back pain,  joint pain or stiffness.  LYMPHATICS: No enlarged nodes. No history of splenectomy.  PSYCHIATRIC: No history of depression or anxiety.  ENDOCRINOLOGIC: No reports of sweating, cold or heat intolerance. No polyuria or polydipsia.  Marland Kitchen   Physical Examination Today's Vitals   03/11/22 1138  BP: 100/74  Pulse: 78  SpO2: 99%  Weight: 221 lb (100.2 kg)  Height: '5\' 4"'$  (1.626 m)   Body mass index is 37.93 kg/m.  Gen: resting comfortably, no acute distress HEENT: no scleral icterus, pupils equal round and reactive, no palptable cervical adenopathy,  CV: RRR, no m/r/g, no jvd Resp: Clear to auscultation bilaterally GI: abdomen is soft, non-tender, non-distended, normal bowel sounds, no hepatosplenomegaly MSK: extremities are warm, no edema.  Skin: warm, no rash Neuro:  no focal deficits Psych: appropriate affect   Diagnostic Studies  11/2018 echo 1. The left ventricle has normal systolic function with an ejection fraction of 60-65%. The cavity size was normal. Left ventricular diastolic Doppler parameters are indeterminate.  2. The mitral valve is normal in structure. Mild thickening of the mitral valve leaflet. No evidence of mitral valve stenosis.  3. The tricuspid valve is normal in structure.  4. The aortic valve is tricuspid Mild thickening of the aortic valve no stenosis of the aortic valve.  5. The aortic root  is normal in size and structure.  6. Pulmonary hypertension is mildly elevated, PASP is 38 mmHg.  7. The inferior vena cava was dilated in size with >50% respiratory variability.  8. The interatrial septum was not well visualized.  9. RV poorly visualized. Grossly appears enlarged with normal function   Assessment and Plan   1. Family history of CAD - strong family history of CAD. She has no known history, and has had negative cath and prior stress testing -no recent symptoms - continue to monitor - EKG SR without acute ischemic changes today   2. HTN - low normal bp's with some orthostatic dizziness. She has lost 30 lbs with associated improvement in bp, will lower lisinopril to '10mg'$  daily.    3. Hyperlipidemia Request pcp labs, continue current meds    4. Lower extremity edema -continue prn lasix, no recent issues   5. Complete heart block - s/p pacemaker placement - recent normal device check, no symptoms. Keep f/u with EP  F/u 1 year       Arnoldo Lenis, M.D.

## 2022-03-11 NOTE — Patient Instructions (Signed)
Medication Instructions:  Decrease Lisinopril to '10mg'$  daily  Continue all other medications.     Labwork: none  Testing/Procedures: none  Follow-Up:  Your physician wants you to follow up in:  1 year.  You should receive a call from the office when due.  If you don't receive this call, please call our office to schedule the follow up appointment    Any Other Special Instructions Will Be Listed Below (If Applicable).   If you need a refill on your cardiac medications before your next appointment, please call your pharmacy.

## 2022-03-17 DIAGNOSIS — M5033 Other cervical disc degeneration, cervicothoracic region: Secondary | ICD-10-CM | POA: Diagnosis not present

## 2022-03-17 DIAGNOSIS — M9901 Segmental and somatic dysfunction of cervical region: Secondary | ICD-10-CM | POA: Diagnosis not present

## 2022-03-22 ENCOUNTER — Ambulatory Visit (INDEPENDENT_AMBULATORY_CARE_PROVIDER_SITE_OTHER): Payer: BC Managed Care – PPO

## 2022-03-22 DIAGNOSIS — I442 Atrioventricular block, complete: Secondary | ICD-10-CM | POA: Diagnosis not present

## 2022-03-22 LAB — CUP PACEART REMOTE DEVICE CHECK
Battery Remaining Longevity: 87 mo
Battery Remaining Percentage: 68 %
Battery Voltage: 3.01 V
Brady Statistic AP VP Percent: 1 %
Brady Statistic AP VS Percent: 1.2 %
Brady Statistic AS VP Percent: 25 %
Brady Statistic AS VS Percent: 74 %
Brady Statistic RA Percent Paced: 1.2 %
Brady Statistic RV Percent Paced: 25 %
Date Time Interrogation Session: 20230529020015
Implantable Lead Implant Date: 20200220
Implantable Lead Implant Date: 20200220
Implantable Lead Location: 753859
Implantable Lead Location: 753860
Implantable Pulse Generator Implant Date: 20200220
Lead Channel Impedance Value: 440 Ohm
Lead Channel Impedance Value: 510 Ohm
Lead Channel Pacing Threshold Amplitude: 0.375 V
Lead Channel Pacing Threshold Amplitude: 0.75 V
Lead Channel Pacing Threshold Pulse Width: 0.5 ms
Lead Channel Pacing Threshold Pulse Width: 0.5 ms
Lead Channel Sensing Intrinsic Amplitude: 11.3 mV
Lead Channel Sensing Intrinsic Amplitude: 2.9 mV
Lead Channel Setting Pacing Amplitude: 1 V
Lead Channel Setting Pacing Amplitude: 1.375
Lead Channel Setting Pacing Pulse Width: 0.5 ms
Lead Channel Setting Sensing Sensitivity: 4 mV
Pulse Gen Model: 2272
Pulse Gen Serial Number: 9107223

## 2022-03-24 DIAGNOSIS — M5033 Other cervical disc degeneration, cervicothoracic region: Secondary | ICD-10-CM | POA: Diagnosis not present

## 2022-03-24 DIAGNOSIS — M9901 Segmental and somatic dysfunction of cervical region: Secondary | ICD-10-CM | POA: Diagnosis not present

## 2022-03-31 DIAGNOSIS — M5033 Other cervical disc degeneration, cervicothoracic region: Secondary | ICD-10-CM | POA: Diagnosis not present

## 2022-03-31 DIAGNOSIS — M9901 Segmental and somatic dysfunction of cervical region: Secondary | ICD-10-CM | POA: Diagnosis not present

## 2022-04-04 DIAGNOSIS — T85398A Other mechanical complication of other ocular prosthetic devices, implants and grafts, initial encounter: Secondary | ICD-10-CM | POA: Diagnosis not present

## 2022-04-04 DIAGNOSIS — H209 Unspecified iridocyclitis: Secondary | ICD-10-CM | POA: Diagnosis not present

## 2022-04-04 DIAGNOSIS — H4041X0 Glaucoma secondary to eye inflammation, right eye, stage unspecified: Secondary | ICD-10-CM | POA: Diagnosis not present

## 2022-04-04 NOTE — Progress Notes (Signed)
Remote pacemaker transmission.   

## 2022-04-05 ENCOUNTER — Ambulatory Visit (INDEPENDENT_AMBULATORY_CARE_PROVIDER_SITE_OTHER): Payer: BC Managed Care – PPO | Admitting: Internal Medicine

## 2022-04-05 ENCOUNTER — Encounter: Payer: Self-pay | Admitting: Internal Medicine

## 2022-04-05 VITALS — BP 120/60 | HR 86 | Ht 64.0 in | Wt 219.0 lb

## 2022-04-05 DIAGNOSIS — I442 Atrioventricular block, complete: Secondary | ICD-10-CM

## 2022-04-05 NOTE — Progress Notes (Signed)
HPI Jordan Perry returns today for followup of CHB, s/p PPM insertion. She has morbid obesity and has done well since her PPM. She has undergone dental implants. She denies chest pain or sob. She had lost weight, over 30 lbs. She denies edema or sob . She is still working part time. Her dose of lisinopril and her insulin have been decreased. She feels well.  Allergies  Allergen Reactions   Invokana [Canagliflozin] Other (See Comments)    Skin peeling   Janumet [Sitagliptin-Metformin Hcl] Other (See Comments)    Skin peeling   Cephalexin Other (See Comments)   Penicillins     Has patient had a PCN reaction causing immediate rash, facial/tongue/throat swelling, SOB or lightheadedness with hypotension: Yes-immediate rash Has patient had a PCN reaction causing severe rash involving mucus membranes or skin necrosis: Yes Has patient had a PCN reaction that required hospitalization: No Has patient had a PCN reaction occurring within the last 10 years: No If all of the above answers are "NO", then may proceed with Cephalosporin use.    Prednisone Rash     Current Outpatient Medications  Medication Sig Dispense Refill   acetaminophen (TYLENOL) 500 MG tablet Take 1,000-1,500 mg by mouth as needed (for headaches.).      ALPRAZolam (XANAX) 0.25 MG tablet Take 0.25 mg by mouth 2 (two) times daily as needed.     aspirin EC 81 MG tablet Take 81 mg by mouth daily.     atorvastatin (LIPITOR) 80 MG tablet TAKE 1 TABLET AT BEDTIME 90 tablet 3   Bromfenac Sodium (PROLENSA) 0.07 % SOLN Apply 2 drops to eye daily in the afternoon.     ciprofloxacin (CILOXAN) 0.3 % ophthalmic solution Place 1 drop into the right eye in the morning and at bedtime.     furosemide (LASIX) 20 MG tablet TAKE 1 TABLET AS NEEDED 90 tablet 1   HUMULIN N KWIKPEN 100 UNIT/ML Kiwkpen Inject 40-60 Units into the skin See admin instructions. Inject 60 units subcutaneously in the morning & 40 units subcutaneously at night.      insulin lispro (HUMALOG) 100 UNIT/ML KwikPen Inject 100 Units into the skin daily.     levocetirizine (XYZAL) 5 MG tablet Take 5 mg by mouth every evening.     lisinopril (ZESTRIL) 10 MG tablet Take 1 tablet (10 mg total) by mouth daily. 90 tablet 3   meloxicam (MOBIC) 7.5 MG tablet Take 1 tablet (7.5 mg total) by mouth daily. 30 tablet 0   metFORMIN (GLUCOPHAGE-XR) 500 MG 24 hr tablet Take 500 mg by mouth 2 (two) times daily after a meal.      Multiple Vitamin (MULTIVITAMIN WITH MINERALS) TABS tablet Take 1 tablet by mouth daily. Centrum Silver Multivitamin     prednisoLONE acetate (PRED FORTE) 1 % ophthalmic suspension Place 1 drop into the right eye 2 (two) times daily.     Probiotic Product (ALIGN) 4 MG CAPS Take 1 capsule by mouth daily.     SYNTHROID 125 MCG tablet Take 125 mcg by mouth daily before breakfast.      No current facility-administered medications for this visit.     Past Medical History:  Diagnosis Date   Allergic rhinitis, cause unspecified    Arthritis    Coronary atherosclerosis of unspecified type of vessel, native or graft    GERD (gastroesophageal reflux disease)    Hypothyroidism    Proteinuria    Pure hyperglyceridemia    Type II or unspecified  type diabetes mellitus without mention of complication, not stated as uncontrolled    Unspecified essential hypertension     ROS:   All systems reviewed and negative except as noted in the HPI.   Past Surgical History:  Procedure Laterality Date   COLONOSCOPY N/A 10/02/2019   Procedure: COLONOSCOPY;  Surgeon: Daneil Dolin, MD;  [facility];  Service: Endoscopy;  Laterality: N/A;  9:30   CYSTECTOMY  1989   removed from end of spine   PACEMAKER IMPLANT N/A 12/13/2018   Procedure: PACEMAKER IMPLANT;  Surgeon: Evans Lance, MD;  Location: [place] [facility];  Service: Cardiovascular;  Laterality: N/A;   POLYPECTOMY  10/02/2019   Procedure: POLYPECTOMY;  Surgeon: Daneil Dolin, MD;  [facility];  Service: Endoscopy;;   REFRACTIVE SURGERY  1994   Right lens implant       Family History  Problem Relation Age of Onset   COPD Mother 28   Heart disease Father    Cancer Father 59   Coronary artery disease Other    Diabetes Other    Obesity Other    Heart attack Brother 53   Coronary artery disease Sister    Hyperlipidemia Other    Hypertension Other    Colon cancer Neg Hx      Social History   Socioeconomic History   Marital status: Married    Spouse name: Not on file   Number of children: 0   Years of education: Not on file   Highest education level: Not on file  Occupational History   Occupation: Producer, 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[employer]    Comment: , 13+ years  Tobacco Use   Smoking status: Never   Smokeless tobacco: Never  Vaping Use   Vaping Use: Never used  Substance and Sexual Activity   Alcohol use: No    Alcohol/week: 0.0 standard drinks of alcohol   Drug use: No   Sexual activity: Not on file  Other Topics Concern   Not on file  Social History Narrative   Not on file   Social Determinants of Health   Financial Resource Strain: Not on file  Food Insecurity: Not on file  Transportation Needs: Not on file  Physical Activity: Not on file  Stress: Not on file  Social Connections: Not on file  Intimate Partner Violence: Not on file     BP 120/60 (BP Location: Left Arm, Patient Position: Sitting, Cuff Size: Normal)   Pulse 86   Ht '5\' 4"'$  (1.626 m)   Wt 219 lb (99.3 kg)   BMI 37.59 kg/m   Physical Exam:  Well appearing NAD HEENT: Unremarkable Neck:  No JVD, no thyromegally Lymphatics:  No adenopathy Back:  No CVA tenderness Lungs:  Clear with no wheezes HEART:  Regular rate rhythm, no murmurs, no rubs, no clicks Abd:  soft, positive bowel sounds, no organomegally, no rebound, no guarding Ext:  2 plus pulses, no edema, no cyanosis, no clubbing Skin:  No rashes no nodules Neuro:  CN II through XII intact, motor grossly  intact   DEVICE  Normal device function.  See PaceArt for details.   Assess/Plan:  CHB - she is conducting today and we have increased her AV delay on her device.  PPM - her device is working normally. Obesity - she has lost 30 lbs on Ozempic. Hopefully she can lose more. HTN - she has reduced her dose of lisinopril. I suspect that she  will no longer require medications once her weight goes down more.  Carleene Overlie Lonza Shimabukuro,MD

## 2022-04-05 NOTE — Patient Instructions (Signed)
Medication Instructions:  Your physician recommends that you continue on your current medications as directed. Please refer to the Current Medication list given to you today.  *If you need a refill on your cardiac medications before your next appointment, please call your pharmacy*   Lab Work: NONE   If you have labs (blood work) drawn today and your tests are completely normal, you will receive your results only by: MyChart Message (if you have MyChart) OR A paper copy in the mail If you have any lab test that is abnormal or we need to change your treatment, we will call you to review the results.   Testing/Procedures: NONE    Follow-Up: At [facility], you and your health needs are our priority.  As part of our continuing mission to provide you with exceptional heart care, we have created designated Provider Care Teams.  These Care Teams include your primary Cardiologist (physician) and Advanced Practice Providers (APPs -  Physician Assistants and Nurse Practitioners) who all work together to provide you with the care you need, when you need it.  We recommend signing up for the patient portal called "MyChart".  Sign up information is provided on this After Visit Summary.  MyChart is used to connect with patients for Virtual Visits (Telemedicine).  Patients are able to view lab/test results, encounter notes, upcoming appointments, etc.  Non-urgent messages can be sent to your provider as well.   To learn more about what you can do with MyChart, go to [email or web address].    Your next appointment:   1 year(s)  The format for your next appointment:   In Person  Provider:   Gregg Taylor, MD    Other Instructions Thank you for choosing [facility]!    Important Information About Sugar       

## 2022-04-06 DIAGNOSIS — M5033 Other cervical disc degeneration, cervicothoracic region: Secondary | ICD-10-CM | POA: Diagnosis not present

## 2022-04-06 DIAGNOSIS — M9901 Segmental and somatic dysfunction of cervical region: Secondary | ICD-10-CM | POA: Diagnosis not present

## 2022-04-07 DIAGNOSIS — E1165 Type 2 diabetes mellitus with hyperglycemia: Secondary | ICD-10-CM | POA: Diagnosis not present

## 2022-04-07 DIAGNOSIS — E11319 Type 2 diabetes mellitus with unspecified diabetic retinopathy without macular edema: Secondary | ICD-10-CM | POA: Diagnosis not present

## 2022-04-07 DIAGNOSIS — I10 Essential (primary) hypertension: Secondary | ICD-10-CM | POA: Diagnosis not present

## 2022-04-07 DIAGNOSIS — E039 Hypothyroidism, unspecified: Secondary | ICD-10-CM | POA: Diagnosis not present

## 2022-04-07 DIAGNOSIS — R809 Proteinuria, unspecified: Secondary | ICD-10-CM | POA: Diagnosis not present

## 2022-04-13 DIAGNOSIS — M9901 Segmental and somatic dysfunction of cervical region: Secondary | ICD-10-CM | POA: Diagnosis not present

## 2022-04-13 DIAGNOSIS — M5033 Other cervical disc degeneration, cervicothoracic region: Secondary | ICD-10-CM | POA: Diagnosis not present

## 2022-04-20 DIAGNOSIS — M5033 Other cervical disc degeneration, cervicothoracic region: Secondary | ICD-10-CM | POA: Diagnosis not present

## 2022-04-20 DIAGNOSIS — M9901 Segmental and somatic dysfunction of cervical region: Secondary | ICD-10-CM | POA: Diagnosis not present

## 2022-04-28 DIAGNOSIS — H401122 Primary open-angle glaucoma, left eye, moderate stage: Secondary | ICD-10-CM | POA: Diagnosis not present

## 2022-04-28 DIAGNOSIS — H4051X2 Glaucoma secondary to other eye disorders, right eye, moderate stage: Secondary | ICD-10-CM | POA: Diagnosis not present

## 2022-04-28 DIAGNOSIS — E113313 Type 2 diabetes mellitus with moderate nonproliferative diabetic retinopathy with macular edema, bilateral: Secondary | ICD-10-CM | POA: Diagnosis not present

## 2022-04-28 DIAGNOSIS — M5033 Other cervical disc degeneration, cervicothoracic region: Secondary | ICD-10-CM | POA: Diagnosis not present

## 2022-04-28 DIAGNOSIS — H35371 Puckering of macula, right eye: Secondary | ICD-10-CM | POA: Diagnosis not present

## 2022-04-28 DIAGNOSIS — M9901 Segmental and somatic dysfunction of cervical region: Secondary | ICD-10-CM | POA: Diagnosis not present

## 2022-05-05 DIAGNOSIS — M9901 Segmental and somatic dysfunction of cervical region: Secondary | ICD-10-CM | POA: Diagnosis not present

## 2022-05-05 DIAGNOSIS — M5033 Other cervical disc degeneration, cervicothoracic region: Secondary | ICD-10-CM | POA: Diagnosis not present

## 2022-05-11 DIAGNOSIS — M9901 Segmental and somatic dysfunction of cervical region: Secondary | ICD-10-CM | POA: Diagnosis not present

## 2022-05-11 DIAGNOSIS — M5033 Other cervical disc degeneration, cervicothoracic region: Secondary | ICD-10-CM | POA: Diagnosis not present

## 2022-05-18 DIAGNOSIS — M5033 Other cervical disc degeneration, cervicothoracic region: Secondary | ICD-10-CM | POA: Diagnosis not present

## 2022-05-18 DIAGNOSIS — M9901 Segmental and somatic dysfunction of cervical region: Secondary | ICD-10-CM | POA: Diagnosis not present

## 2022-05-26 DIAGNOSIS — M5033 Other cervical disc degeneration, cervicothoracic region: Secondary | ICD-10-CM | POA: Diagnosis not present

## 2022-05-26 DIAGNOSIS — M9901 Segmental and somatic dysfunction of cervical region: Secondary | ICD-10-CM | POA: Diagnosis not present

## 2022-06-02 DIAGNOSIS — M5033 Other cervical disc degeneration, cervicothoracic region: Secondary | ICD-10-CM | POA: Diagnosis not present

## 2022-06-02 DIAGNOSIS — M9901 Segmental and somatic dysfunction of cervical region: Secondary | ICD-10-CM | POA: Diagnosis not present

## 2022-06-09 DIAGNOSIS — M9901 Segmental and somatic dysfunction of cervical region: Secondary | ICD-10-CM | POA: Diagnosis not present

## 2022-06-09 DIAGNOSIS — M5033 Other cervical disc degeneration, cervicothoracic region: Secondary | ICD-10-CM | POA: Diagnosis not present

## 2022-06-16 DIAGNOSIS — M5033 Other cervical disc degeneration, cervicothoracic region: Secondary | ICD-10-CM | POA: Diagnosis not present

## 2022-06-16 DIAGNOSIS — M9901 Segmental and somatic dysfunction of cervical region: Secondary | ICD-10-CM | POA: Diagnosis not present

## 2022-06-20 ENCOUNTER — Ambulatory Visit (INDEPENDENT_AMBULATORY_CARE_PROVIDER_SITE_OTHER): Payer: BC Managed Care – PPO

## 2022-06-20 DIAGNOSIS — I442 Atrioventricular block, complete: Secondary | ICD-10-CM

## 2022-06-21 LAB — CUP PACEART REMOTE DEVICE CHECK
Battery Remaining Longevity: 87 mo
Battery Remaining Percentage: 66 %
Battery Voltage: 3.01 V
Brady Statistic AP VP Percent: 1 %
Brady Statistic AP VS Percent: 1 %
Brady Statistic AS VP Percent: 1 %
Brady Statistic AS VS Percent: 99 %
Brady Statistic RA Percent Paced: 1 %
Brady Statistic RV Percent Paced: 1 %
Date Time Interrogation Session: 20230828020014
Implantable Lead Implant Date: 20200220
Implantable Lead Implant Date: 20200220
Implantable Lead Location: 753859
Implantable Lead Location: 753860
Implantable Pulse Generator Implant Date: 20200220
Lead Channel Impedance Value: 510 Ohm
Lead Channel Impedance Value: 540 Ohm
Lead Channel Pacing Threshold Amplitude: 0.375 V
Lead Channel Pacing Threshold Amplitude: 0.625 V
Lead Channel Pacing Threshold Pulse Width: 0.5 ms
Lead Channel Pacing Threshold Pulse Width: 0.5 ms
Lead Channel Sensing Intrinsic Amplitude: 10.5 mV
Lead Channel Sensing Intrinsic Amplitude: 3.7 mV
Lead Channel Setting Pacing Amplitude: 0.875
Lead Channel Setting Pacing Amplitude: 1.375
Lead Channel Setting Pacing Pulse Width: 0.5 ms
Lead Channel Setting Sensing Sensitivity: 4 mV
Pulse Gen Model: 2272
Pulse Gen Serial Number: 9107223

## 2022-06-28 DIAGNOSIS — M5033 Other cervical disc degeneration, cervicothoracic region: Secondary | ICD-10-CM | POA: Diagnosis not present

## 2022-06-28 DIAGNOSIS — M9901 Segmental and somatic dysfunction of cervical region: Secondary | ICD-10-CM | POA: Diagnosis not present

## 2022-07-11 DIAGNOSIS — T85398A Other mechanical complication of other ocular prosthetic devices, implants and grafts, initial encounter: Secondary | ICD-10-CM | POA: Diagnosis not present

## 2022-07-11 DIAGNOSIS — H40022 Open angle with borderline findings, high risk, left eye: Secondary | ICD-10-CM | POA: Diagnosis not present

## 2022-07-11 DIAGNOSIS — M5033 Other cervical disc degeneration, cervicothoracic region: Secondary | ICD-10-CM | POA: Diagnosis not present

## 2022-07-11 DIAGNOSIS — E113213 Type 2 diabetes mellitus with mild nonproliferative diabetic retinopathy with macular edema, bilateral: Secondary | ICD-10-CM | POA: Diagnosis not present

## 2022-07-11 DIAGNOSIS — M9901 Segmental and somatic dysfunction of cervical region: Secondary | ICD-10-CM | POA: Diagnosis not present

## 2022-07-11 DIAGNOSIS — H209 Unspecified iridocyclitis: Secondary | ICD-10-CM | POA: Diagnosis not present

## 2022-07-13 DIAGNOSIS — Z1283 Encounter for screening for malignant neoplasm of skin: Secondary | ICD-10-CM | POA: Diagnosis not present

## 2022-07-13 DIAGNOSIS — I781 Nevus, non-neoplastic: Secondary | ICD-10-CM | POA: Diagnosis not present

## 2022-07-13 DIAGNOSIS — X32XXXD Exposure to sunlight, subsequent encounter: Secondary | ICD-10-CM | POA: Diagnosis not present

## 2022-07-13 DIAGNOSIS — D225 Melanocytic nevi of trunk: Secondary | ICD-10-CM | POA: Diagnosis not present

## 2022-07-13 DIAGNOSIS — L570 Actinic keratosis: Secondary | ICD-10-CM | POA: Diagnosis not present

## 2022-07-15 NOTE — Progress Notes (Signed)
Remote pacemaker transmission.   

## 2022-07-19 DIAGNOSIS — M9901 Segmental and somatic dysfunction of cervical region: Secondary | ICD-10-CM | POA: Diagnosis not present

## 2022-07-19 DIAGNOSIS — M5033 Other cervical disc degeneration, cervicothoracic region: Secondary | ICD-10-CM | POA: Diagnosis not present

## 2022-07-27 DIAGNOSIS — M5033 Other cervical disc degeneration, cervicothoracic region: Secondary | ICD-10-CM | POA: Diagnosis not present

## 2022-07-27 DIAGNOSIS — M9901 Segmental and somatic dysfunction of cervical region: Secondary | ICD-10-CM | POA: Diagnosis not present

## 2022-08-04 DIAGNOSIS — M5033 Other cervical disc degeneration, cervicothoracic region: Secondary | ICD-10-CM | POA: Diagnosis not present

## 2022-08-04 DIAGNOSIS — M9901 Segmental and somatic dysfunction of cervical region: Secondary | ICD-10-CM | POA: Diagnosis not present

## 2022-08-08 DIAGNOSIS — I10 Essential (primary) hypertension: Secondary | ICD-10-CM | POA: Diagnosis not present

## 2022-08-08 DIAGNOSIS — E1165 Type 2 diabetes mellitus with hyperglycemia: Secondary | ICD-10-CM | POA: Diagnosis not present

## 2022-08-08 DIAGNOSIS — E039 Hypothyroidism, unspecified: Secondary | ICD-10-CM | POA: Diagnosis not present

## 2022-08-08 DIAGNOSIS — R809 Proteinuria, unspecified: Secondary | ICD-10-CM | POA: Diagnosis not present

## 2022-08-10 DIAGNOSIS — M9901 Segmental and somatic dysfunction of cervical region: Secondary | ICD-10-CM | POA: Diagnosis not present

## 2022-08-10 DIAGNOSIS — M5033 Other cervical disc degeneration, cervicothoracic region: Secondary | ICD-10-CM | POA: Diagnosis not present

## 2022-08-18 DIAGNOSIS — M9901 Segmental and somatic dysfunction of cervical region: Secondary | ICD-10-CM | POA: Diagnosis not present

## 2022-08-18 DIAGNOSIS — M5033 Other cervical disc degeneration, cervicothoracic region: Secondary | ICD-10-CM | POA: Diagnosis not present

## 2022-08-23 DIAGNOSIS — E785 Hyperlipidemia, unspecified: Secondary | ICD-10-CM | POA: Diagnosis not present

## 2022-08-23 DIAGNOSIS — E118 Type 2 diabetes mellitus with unspecified complications: Secondary | ICD-10-CM | POA: Diagnosis not present

## 2022-08-23 DIAGNOSIS — I10 Essential (primary) hypertension: Secondary | ICD-10-CM | POA: Diagnosis not present

## 2022-08-23 DIAGNOSIS — Z0001 Encounter for general adult medical examination with abnormal findings: Secondary | ICD-10-CM | POA: Diagnosis not present

## 2022-08-23 DIAGNOSIS — F419 Anxiety disorder, unspecified: Secondary | ICD-10-CM | POA: Diagnosis not present

## 2022-08-23 DIAGNOSIS — Z1331 Encounter for screening for depression: Secondary | ICD-10-CM | POA: Diagnosis not present

## 2022-08-23 DIAGNOSIS — Z6834 Body mass index (BMI) 34.0-34.9, adult: Secondary | ICD-10-CM | POA: Diagnosis not present

## 2022-08-23 DIAGNOSIS — E6609 Other obesity due to excess calories: Secondary | ICD-10-CM | POA: Diagnosis not present

## 2022-08-24 DIAGNOSIS — M9901 Segmental and somatic dysfunction of cervical region: Secondary | ICD-10-CM | POA: Diagnosis not present

## 2022-08-24 DIAGNOSIS — M5033 Other cervical disc degeneration, cervicothoracic region: Secondary | ICD-10-CM | POA: Diagnosis not present

## 2022-08-31 DIAGNOSIS — M9901 Segmental and somatic dysfunction of cervical region: Secondary | ICD-10-CM | POA: Diagnosis not present

## 2022-08-31 DIAGNOSIS — M5033 Other cervical disc degeneration, cervicothoracic region: Secondary | ICD-10-CM | POA: Diagnosis not present

## 2022-09-09 ENCOUNTER — Encounter: Payer: Self-pay | Admitting: *Deleted

## 2022-09-14 DIAGNOSIS — M5033 Other cervical disc degeneration, cervicothoracic region: Secondary | ICD-10-CM | POA: Diagnosis not present

## 2022-09-14 DIAGNOSIS — M9901 Segmental and somatic dysfunction of cervical region: Secondary | ICD-10-CM | POA: Diagnosis not present

## 2022-09-16 ENCOUNTER — Other Ambulatory Visit: Payer: Self-pay | Admitting: Cardiology

## 2022-09-19 ENCOUNTER — Ambulatory Visit (INDEPENDENT_AMBULATORY_CARE_PROVIDER_SITE_OTHER): Payer: BC Managed Care – PPO

## 2022-09-19 DIAGNOSIS — I442 Atrioventricular block, complete: Secondary | ICD-10-CM | POA: Diagnosis not present

## 2022-09-20 LAB — CUP PACEART REMOTE DEVICE CHECK
Battery Remaining Longevity: 83 mo
Battery Remaining Percentage: 64 %
Battery Voltage: 3.01 V
Brady Statistic AP VP Percent: 1 %
Brady Statistic AP VS Percent: 1 %
Brady Statistic AS VP Percent: 1.4 %
Brady Statistic AS VS Percent: 98 %
Brady Statistic RA Percent Paced: 1 %
Brady Statistic RV Percent Paced: 1.5 %
Date Time Interrogation Session: 20231127020012
Implantable Lead Connection Status: 753985
Implantable Lead Connection Status: 753985
Implantable Lead Implant Date: 20200220
Implantable Lead Implant Date: 20200220
Implantable Lead Location: 753859
Implantable Lead Location: 753860
Implantable Pulse Generator Implant Date: 20200220
Lead Channel Impedance Value: 450 Ohm
Lead Channel Impedance Value: 510 Ohm
Lead Channel Pacing Threshold Amplitude: 0.5 V
Lead Channel Pacing Threshold Amplitude: 0.625 V
Lead Channel Pacing Threshold Pulse Width: 0.5 ms
Lead Channel Pacing Threshold Pulse Width: 0.5 ms
Lead Channel Sensing Intrinsic Amplitude: 3.5 mV
Lead Channel Sensing Intrinsic Amplitude: 8.2 mV
Lead Channel Setting Pacing Amplitude: 0.875
Lead Channel Setting Pacing Amplitude: 1.5 V
Lead Channel Setting Pacing Pulse Width: 0.5 ms
Lead Channel Setting Sensing Sensitivity: 4 mV
Pulse Gen Model: 2272
Pulse Gen Serial Number: 9107223

## 2022-09-22 DIAGNOSIS — M5033 Other cervical disc degeneration, cervicothoracic region: Secondary | ICD-10-CM | POA: Diagnosis not present

## 2022-09-22 DIAGNOSIS — M9901 Segmental and somatic dysfunction of cervical region: Secondary | ICD-10-CM | POA: Diagnosis not present

## 2022-09-28 DIAGNOSIS — M5033 Other cervical disc degeneration, cervicothoracic region: Secondary | ICD-10-CM | POA: Diagnosis not present

## 2022-09-28 DIAGNOSIS — M9901 Segmental and somatic dysfunction of cervical region: Secondary | ICD-10-CM | POA: Diagnosis not present

## 2022-10-06 DIAGNOSIS — M9901 Segmental and somatic dysfunction of cervical region: Secondary | ICD-10-CM | POA: Diagnosis not present

## 2022-10-06 DIAGNOSIS — M5033 Other cervical disc degeneration, cervicothoracic region: Secondary | ICD-10-CM | POA: Diagnosis not present

## 2022-10-11 DIAGNOSIS — M5033 Other cervical disc degeneration, cervicothoracic region: Secondary | ICD-10-CM | POA: Diagnosis not present

## 2022-10-11 DIAGNOSIS — M9901 Segmental and somatic dysfunction of cervical region: Secondary | ICD-10-CM | POA: Diagnosis not present

## 2022-10-19 DIAGNOSIS — M5033 Other cervical disc degeneration, cervicothoracic region: Secondary | ICD-10-CM | POA: Diagnosis not present

## 2022-10-19 DIAGNOSIS — M9901 Segmental and somatic dysfunction of cervical region: Secondary | ICD-10-CM | POA: Diagnosis not present

## 2022-10-27 DIAGNOSIS — M9901 Segmental and somatic dysfunction of cervical region: Secondary | ICD-10-CM | POA: Diagnosis not present

## 2022-10-27 DIAGNOSIS — M5033 Other cervical disc degeneration, cervicothoracic region: Secondary | ICD-10-CM | POA: Diagnosis not present

## 2022-11-01 NOTE — Progress Notes (Signed)
Remote pacemaker transmission.   

## 2022-11-02 DIAGNOSIS — M5033 Other cervical disc degeneration, cervicothoracic region: Secondary | ICD-10-CM | POA: Diagnosis not present

## 2022-11-02 DIAGNOSIS — M9901 Segmental and somatic dysfunction of cervical region: Secondary | ICD-10-CM | POA: Diagnosis not present

## 2022-11-03 DIAGNOSIS — H401122 Primary open-angle glaucoma, left eye, moderate stage: Secondary | ICD-10-CM | POA: Diagnosis not present

## 2022-11-03 DIAGNOSIS — H35371 Puckering of macula, right eye: Secondary | ICD-10-CM | POA: Diagnosis not present

## 2022-11-03 DIAGNOSIS — H4051X2 Glaucoma secondary to other eye disorders, right eye, moderate stage: Secondary | ICD-10-CM | POA: Diagnosis not present

## 2022-11-03 DIAGNOSIS — E113313 Type 2 diabetes mellitus with moderate nonproliferative diabetic retinopathy with macular edema, bilateral: Secondary | ICD-10-CM | POA: Diagnosis not present

## 2022-11-09 ENCOUNTER — Encounter: Payer: Self-pay | Admitting: *Deleted

## 2022-11-09 ENCOUNTER — Encounter (INDEPENDENT_AMBULATORY_CARE_PROVIDER_SITE_OTHER): Payer: Self-pay | Admitting: *Deleted

## 2022-11-09 MED ORDER — NA SULFATE-K SULFATE-MG SULF 17.5-3.13-1.6 GM/177ML PO SOLN: ORAL | 0 refills | Status: DC

## 2022-11-09 NOTE — Progress Notes (Signed)
Questionnaire from recall list, no referral needed

## 2022-11-09 NOTE — Patient Instructions (Signed)
Procedure: colonoscopy  Estimated body mass index is 32.61 kg/m as calculated from the following:   Height as of this encounter: '5\' 4"'$  (1.626 m).   Weight as of this encounter: 190 lb (86.2 kg).   Have you had a colonoscopy before?  Yes, 2020  Do you have family history of colon cancer?  No  Do you have a family history of polyps? yes  Previous colonoscopy with polyps removed? yes  Do you have a history colorectal cancer?   No  Are you diabetic?  Yes, type 2  Do you have a prosthetic or mechanical heart valve? [place]  Do you have a pacemaker/defibrillator?   Pacemaker  Have you had endocarditis/atrial fibrillation?  No  Do you use supplemental oxygen/CPAP?  No  Have you had joint replacement within the last 12 months?  No  Do you tend to be constipated or have to use laxatives?  No   Do you have history of alcohol use? If yes, how much and how often.  No  Do you have history or are you using drugs? If yes, what do are you  using?  No  Have you ever had a stroke/heart attack?  No  Have you ever had a heart or other vascular stent placed,?No  Do you take weight loss medication? No  female patients,: have you had a hysterectomy? No                              are you post menopausal?  yes                              do you still have your menstrual cycle? No    Date of last menstrual period? 2010  Do you take any blood-thinning medications such as: (Plavix, aspirin, Coumadin, Aggrenox, Brilinta, Xarelto, Eliquis, Pradaxa, Savaysa or Effient)? yes  If yes we need the name, milligram, dosage and who is prescribing doctor:  aspirin 81 mg             Current Outpatient Medications  Medication Sig Dispense Refill   aspirin EC 81 MG tablet Take 81 mg by mouth daily.     atorvastatin (LIPITOR) 80 MG tablet TAKE 1 TABLET AT BEDTIME 90 tablet 1   brimonidine (ALPHAGAN) 0.2 % ophthalmic solution Place 1 drop into the right eye 2 (two) times daily.     Bromfenac Sodium  (PROLENSA) 0.07 % SOLN Apply 2 drops to eye daily in the afternoon.     furosemide (LASIX) 20 MG tablet TAKE 1 TABLET AS NEEDED 90 tablet 1   HUMULIN N KWIKPEN 100 UNIT/ML Kiwkpen Inject 20-35 Units into the skin See admin instructions. Inject 60 units subcutaneously in the morning & 40 units subcutaneously at night.     insulin lispro (HUMALOG) 100 UNIT/ML KwikPen Inject 100 Units into the skin daily.     latanoprost (XALATAN) 0.005 % ophthalmic solution Place 1 drop into the right eye at bedtime.     lisinopril (ZESTRIL) 10 MG tablet Take 1 tablet (10 mg total) by mouth daily. 90 tablet 3   metFORMIN (GLUCOPHAGE-XR) 500 MG 24 hr tablet Take 500 mg by mouth 2 (two) times daily after a meal.      MOUNJARO 7.5 MG/0.5ML Pen Inject 7.5 mg into the skin once a week.     Multiple Vitamins-Minerals (CENTRUM SILVER 50+WOMEN) TABS Take 1 tablet  by mouth daily.     Probiotic Product (ALIGN) 4 MG CAPS Take 1 capsule by mouth daily.     SYNTHROID 125 MCG tablet Take 125 mcg by mouth daily before breakfast.      No current facility-administered medications for this visit.    Allergies  Allergen Reactions   Invokana [Canagliflozin] Other (See Comments)    Skin peeling   Janumet [Sitagliptin-Metformin Hcl] Other (See Comments)    Skin peeling   Cephalexin Other (See Comments)   Penicillins     Has patient had a PCN reaction causing immediate rash, facial/tongue/throat swelling, SOB or lightheadedness with hypotension: Yes-immediate rash Has patient had a PCN reaction causing severe rash involving mucus membranes or skin necrosis: Yes Has patient had a PCN reaction that required hospitalization: No Has patient had a PCN reaction occurring within the last 10 years: No If all of the above answers are "NO", then may proceed with Cephalosporin use.    Prednisone Rash

## 2022-11-09 NOTE — Progress Notes (Signed)
Spoke with pt. She has been scheduled for 2/1 at 830am. Aware will send instructions/pre-op appt to mychart. Discuss DM meds. Adjustments will be on instructions. She wants same prep as last time.

## 2022-11-10 DIAGNOSIS — M5033 Other cervical disc degeneration, cervicothoracic region: Secondary | ICD-10-CM | POA: Diagnosis not present

## 2022-11-10 DIAGNOSIS — M9901 Segmental and somatic dysfunction of cervical region: Secondary | ICD-10-CM | POA: Diagnosis not present

## 2022-11-17 DIAGNOSIS — M9901 Segmental and somatic dysfunction of cervical region: Secondary | ICD-10-CM | POA: Diagnosis not present

## 2022-11-17 DIAGNOSIS — M5033 Other cervical disc degeneration, cervicothoracic region: Secondary | ICD-10-CM | POA: Diagnosis not present

## 2022-11-21 DIAGNOSIS — T85398A Other mechanical complication of other ocular prosthetic devices, implants and grafts, initial encounter: Secondary | ICD-10-CM | POA: Diagnosis not present

## 2022-11-21 DIAGNOSIS — H40022 Open angle with borderline findings, high risk, left eye: Secondary | ICD-10-CM | POA: Diagnosis not present

## 2022-11-21 DIAGNOSIS — M5033 Other cervical disc degeneration, cervicothoracic region: Secondary | ICD-10-CM | POA: Diagnosis not present

## 2022-11-21 DIAGNOSIS — H209 Unspecified iridocyclitis: Secondary | ICD-10-CM | POA: Diagnosis not present

## 2022-11-21 DIAGNOSIS — E113213 Type 2 diabetes mellitus with mild nonproliferative diabetic retinopathy with macular edema, bilateral: Secondary | ICD-10-CM | POA: Diagnosis not present

## 2022-11-21 DIAGNOSIS — M9901 Segmental and somatic dysfunction of cervical region: Secondary | ICD-10-CM | POA: Diagnosis not present

## 2022-11-21 NOTE — Patient Instructions (Addendum)
Jordan Perry  11/21/2022     '@PREFPERIOPPHARMACY'$ @   Your procedure is scheduled on  11/24/2022.   Report to [facility] at  [place]   Call this number if you have problems the morning of surgery:  [phone]  If you experience any cold or flu symptoms such as cough, fever, chills, shortness of breath, etc. between now and your scheduled surgery, please notify us at the above number.   Remember:  Follow the diet and prep instructions given to you by the office.       Your last dose of mounjaro should be on 11/16/2022.       Take 1/2 of your usual night time insulin the night  before your procedure.      DO NOT take any medications for diabetes the morning of your procedure.     Take these medicines the morning of surgery with A SIP OF WATER                                   levothyroxine.     Do not wear jewelry, make-up or nail polish.  Do not wear lotions, powders, or perfumes, or deodorant.  Do not shave 48 hours prior to surgery.  Men may shave face and neck.  Do not bring valuables to the hospital.  [facility] is not responsible for any belongings or valuables.  Contacts, dentures or bridgework may not be worn into surgery.  Leave your suitcase in the car.  After surgery it may be brought to your room.  For patients admitted to the hospital, discharge time will be determined by your treatment team.  Patients discharged the day of surgery will not be allowed to drive home and must have someone with them for 24 hours.    Special instructions:   DO NOT smoke tobacco or vape for 24 hours before your procedure.  Please read over the following fact sheets that you were given. Anesthesia Post-op Instructions and Care and Recovery After Surgery      Colonoscopy, Adult, Care After The following information offers guidance on how to care for yourself after your procedure. Your health care provider may also give you more specific instructions. If you  have problems or questions, contact your health care provider. What can I expect after the procedure? After the procedure, it is common to have: A small amount of blood in your stool for 24 hours after the procedure. Some gas. Mild cramping or bloating of your abdomen. Follow these instructions at home: Eating and drinking  Drink enough fluid to keep your urine pale yellow. Follow instructions from your health care provider about eating or drinking restrictions. Resume your normal diet as told by your health care provider. Avoid heavy or fried foods that are hard to digest. Activity Rest as told by your health care provider. Avoid sitting for a long time without moving. Get up to take short walks every 1-2 hours. This is important to improve blood flow and breathing. Ask for help if you feel weak or unsteady. Return to your normal activities as told by your health care provider. Ask your health care provider what activities are safe for you. Managing cramping and bloating  Try walking around when you have cramps or feel bloated. If directed, apply heat to your abdomen as told by your health care provider. Use the heat  source that your health care provider recommends, such as a moist heat pack or a heating pad. Place a towel between your skin and the heat source. Leave the heat on for 20-30 minutes. Remove the heat if your skin turns bright red. This is especially important if you are unable to feel pain, heat, or cold. You have a greater risk of getting burned. General instructions If you were given a sedative during the procedure, it can affect you for several hours. Do not drive or operate machinery until your health care provider says that it is safe. For the first 24 hours after the procedure: Do not sign important documents. Do not drink alcohol. Do your regular daily activities at a slower pace than normal. Eat soft foods that are easy to digest. Take over-the-counter and  prescription medicines only as told by your health care provider. Keep all follow-up visits. This is important. Contact a health care provider if: You have blood in your stool 2-3 days after the procedure. Get help right away if: You have more than a small spotting of blood in your stool. You have large blood clots in your stool. You have swelling of your abdomen. You have nausea or vomiting. You have a fever. You have increasing pain in your abdomen that is not relieved with medicine. These symptoms may be an emergency. Get help right away. Call 911. Do not wait to see if the symptoms will go away. Do not drive yourself to the hospital. Summary After the procedure, it is common to have a small amount of blood in your stool. You may also have mild cramping and bloating of your abdomen. If you were given a sedative during the procedure, it can affect you for several hours. Do not drive or operate machinery until your health care provider says that it is safe. Get help right away if you have a lot of blood in your stool, nausea or vomiting, a fever, or increased pain in your abdomen. This information is not intended to replace advice given to you by your health care provider. Make sure you discuss any questions you have with your health care provider. Document Revised: 06/02/2021 Document Reviewed: 06/02/2021 Elsevier Patient Education  [place] After The following information offers guidance on how to care for yourself after your procedure. Your health care provider may also give you more specific instructions. If you have problems or questions, contact your health care provider. What can I expect after the procedure? After the procedure, it is common to have: Tiredness. Little or no memory about what happened during or after the procedure. Impaired judgment when it comes to making decisions. Nausea or vomiting. Some trouble with balance. Follow  these instructions at home: For the time period you were told by your health care provider:  Rest. Do not participate in activities where you could fall or become injured. Do not drive or use machinery. Do not drink alcohol. Do not take sleeping pills or medicines that cause drowsiness. Do not make important decisions or sign legal documents. Do not take care of children on your own. Medicines Take over-the-counter and prescription medicines only as told by your health care provider. If you were prescribed antibiotics, take them as told by your health care provider. Do not stop using the antibiotic even if you start to feel better. Eating and drinking Follow instructions from your health care provider about what you may eat and drink. Drink enough fluid to keep  your urine pale yellow. If you vomit: Drink clear fluids slowly and in small amounts as you are able. Clear fluids include water, ice chips, low-calorie sports drinks, and fruit juice that has water added to it (diluted fruit juice). Eat light and bland foods in small amounts as you are able. These foods include bananas, applesauce, rice, lean meats, toast, and crackers. General instructions  Have a responsible adult stay with you for the time you are told. It is important to have someone help care for you until you are awake and alert. If you have sleep apnea, surgery and some medicines can increase your risk for breathing problems. Follow instructions from your health care provider about wearing your sleep device: When you are sleeping. This includes during daytime naps. While taking prescription pain medicines, sleeping medicines, or medicines that make you drowsy. Do not use any products that contain nicotine or tobacco. These products include cigarettes, chewing tobacco, and vaping devices, such as e-cigarettes. If you need help quitting, ask your health care provider. Contact a health care provider if: You feel nauseous or  vomit every time you eat or drink. You feel light-headed. You are still sleepy or having trouble with balance after 24 hours. You get a rash. You have a fever. You have redness or swelling around the IV site. Get help right away if: You have trouble breathing. You have new confusion after you get home. These symptoms may be an emergency. Get help right away. Call 911. Do not wait to see if the symptoms will go away. Do not drive yourself to the hospital. This information is not intended to replace advice given to you by your health care provider. Make sure you discuss any questions you have with your health care provider. Document Revised: 03/07/2022 Document Reviewed: 03/07/2022 Elsevier Patient Education  [place].

## 2022-11-22 ENCOUNTER — Encounter (HOSPITAL_COMMUNITY)
Admission: RE | Admit: 2022-11-22 | Discharge: 2022-11-22 | Disposition: A | Discharge status: Home / Self Care | Payer: BC Managed Care – PPO | Source: Ambulatory Visit | Attending: Internal Medicine | Admitting: Internal Medicine

## 2022-11-22 ENCOUNTER — Encounter (HOSPITAL_COMMUNITY): Payer: Self-pay

## 2022-11-22 VITALS — BP 102/45 | HR 71 | Temp 98.2°F | Resp 18 | Ht 64.0 in | Wt 190.0 lb

## 2022-11-22 DIAGNOSIS — D120 Benign neoplasm of cecum: Secondary | ICD-10-CM | POA: Diagnosis not present

## 2022-11-22 DIAGNOSIS — I10 Essential (primary) hypertension: Secondary | ICD-10-CM | POA: Diagnosis not present

## 2022-11-22 DIAGNOSIS — Z86010 Personal history of colonic polyps: Secondary | ICD-10-CM | POA: Diagnosis not present

## 2022-11-22 DIAGNOSIS — E119 Type 2 diabetes mellitus without complications: Secondary | ICD-10-CM | POA: Insufficient documentation

## 2022-11-22 DIAGNOSIS — K640 First degree hemorrhoids: Secondary | ICD-10-CM | POA: Diagnosis not present

## 2022-11-22 DIAGNOSIS — Z1211 Encounter for screening for malignant neoplasm of colon: Secondary | ICD-10-CM | POA: Diagnosis not present

## 2022-11-22 DIAGNOSIS — E039 Hypothyroidism, unspecified: Secondary | ICD-10-CM | POA: Diagnosis not present

## 2022-11-22 DIAGNOSIS — K648 Other hemorrhoids: Secondary | ICD-10-CM | POA: Diagnosis not present

## 2022-11-22 DIAGNOSIS — I2510 Atherosclerotic heart disease of native coronary artery without angina pectoris: Secondary | ICD-10-CM | POA: Diagnosis not present

## 2022-11-22 DIAGNOSIS — K219 Gastro-esophageal reflux disease without esophagitis: Secondary | ICD-10-CM | POA: Diagnosis not present

## 2022-11-22 DIAGNOSIS — Z950 Presence of cardiac pacemaker: Secondary | ICD-10-CM | POA: Diagnosis not present

## 2022-11-22 DIAGNOSIS — K5730 Diverticulosis of large intestine without perforation or abscess without bleeding: Secondary | ICD-10-CM | POA: Diagnosis not present

## 2022-11-22 DIAGNOSIS — Z01812 Encounter for preprocedural laboratory examination: Secondary | ICD-10-CM | POA: Insufficient documentation

## 2022-11-22 DIAGNOSIS — Z7984 Long term (current) use of oral hypoglycemic drugs: Secondary | ICD-10-CM | POA: Diagnosis not present

## 2022-11-22 HISTORY — DX: Bradycardia, unspecified: R00.1

## 2022-11-22 HISTORY — DX: Presence of cardiac pacemaker: Z95.0

## 2022-11-22 HISTORY — DX: Cardiac arrhythmia, unspecified: I49.9

## 2022-11-22 LAB — BASIC METABOLIC PANEL
Anion gap: 9 (ref 5–15)
BUN: 10 mg/dL (ref 8–23)
CO2: 26 mmol/L (ref 22–32)
Calcium: 8.7 mg/dL — ABNORMAL LOW (ref 8.9–10.3)
Chloride: 104 mmol/L (ref 98–111)
Creatinine, Ser: 0.74 mg/dL (ref 0.44–1.00)
GFR, Estimated: >60 mL/min (ref >60)
Glucose, Bld: 150 mg/dL — ABNORMAL HIGH (ref 70–99)
Potassium: 4.5 mmol/L (ref 3.5–5.1)
Sodium: 139 mmol/L (ref 135–145)

## 2022-11-24 ENCOUNTER — Ambulatory Visit (HOSPITAL_COMMUNITY): Payer: BC Managed Care – PPO | Admitting: Anesthesiology

## 2022-11-24 ENCOUNTER — Encounter (HOSPITAL_COMMUNITY): Payer: Self-pay | Admitting: Internal Medicine

## 2022-11-24 ENCOUNTER — Telehealth: Payer: Self-pay | Admitting: *Deleted

## 2022-11-24 ENCOUNTER — Encounter (HOSPITAL_COMMUNITY)
Admission: RE | Disposition: A | Discharge status: Home / Self Care | Payer: Self-pay | Source: Ambulatory Visit | Attending: Internal Medicine

## 2022-11-24 ENCOUNTER — Encounter: Payer: Self-pay | Admitting: Internal Medicine

## 2022-11-24 ENCOUNTER — Other Ambulatory Visit: Payer: Self-pay

## 2022-11-24 ENCOUNTER — Ambulatory Visit (HOSPITAL_COMMUNITY)
Admission: RE | Admit: 2022-11-24 | Discharge: 2022-11-24 | Disposition: A | Discharge status: Home / Self Care | Payer: BC Managed Care – PPO | Source: Ambulatory Visit | Attending: Internal Medicine | Admitting: Internal Medicine

## 2022-11-24 DIAGNOSIS — E039 Hypothyroidism, unspecified: Secondary | ICD-10-CM | POA: Insufficient documentation

## 2022-11-24 DIAGNOSIS — E119 Type 2 diabetes mellitus without complications: Secondary | ICD-10-CM | POA: Diagnosis not present

## 2022-11-24 DIAGNOSIS — K5730 Diverticulosis of large intestine without perforation or abscess without bleeding: Secondary | ICD-10-CM | POA: Insufficient documentation

## 2022-11-24 DIAGNOSIS — K640 First degree hemorrhoids: Secondary | ICD-10-CM | POA: Insufficient documentation

## 2022-11-24 DIAGNOSIS — K219 Gastro-esophageal reflux disease without esophagitis: Secondary | ICD-10-CM | POA: Insufficient documentation

## 2022-11-24 DIAGNOSIS — Z1211 Encounter for screening for malignant neoplasm of colon: Secondary | ICD-10-CM | POA: Diagnosis not present

## 2022-11-24 DIAGNOSIS — D120 Benign neoplasm of cecum: Secondary | ICD-10-CM | POA: Diagnosis not present

## 2022-11-24 DIAGNOSIS — I10 Essential (primary) hypertension: Secondary | ICD-10-CM | POA: Insufficient documentation

## 2022-11-24 DIAGNOSIS — Z7984 Long term (current) use of oral hypoglycemic drugs: Secondary | ICD-10-CM | POA: Insufficient documentation

## 2022-11-24 DIAGNOSIS — K648 Other hemorrhoids: Secondary | ICD-10-CM | POA: Diagnosis not present

## 2022-11-24 DIAGNOSIS — I442 Atrioventricular block, complete: Secondary | ICD-10-CM | POA: Diagnosis not present

## 2022-11-24 DIAGNOSIS — I2510 Atherosclerotic heart disease of native coronary artery without angina pectoris: Secondary | ICD-10-CM | POA: Insufficient documentation

## 2022-11-24 DIAGNOSIS — Z86010 Personal history of colonic polyps: Secondary | ICD-10-CM | POA: Insufficient documentation

## 2022-11-24 DIAGNOSIS — Z950 Presence of cardiac pacemaker: Secondary | ICD-10-CM | POA: Insufficient documentation

## 2022-11-24 DIAGNOSIS — Z09 Encounter for follow-up examination after completed treatment for conditions other than malignant neoplasm: Secondary | ICD-10-CM | POA: Diagnosis not present

## 2022-11-24 DIAGNOSIS — K635 Polyp of colon: Secondary | ICD-10-CM | POA: Diagnosis not present

## 2022-11-24 HISTORY — PX: POLYPECTOMY: SHX149

## 2022-11-24 HISTORY — PX: COLONOSCOPY WITH PROPOFOL: SHX5780

## 2022-11-24 LAB — GLUCOSE, CAPILLARY: Glucose-Capillary: 194 mg/dL — ABNORMAL HIGH (ref 70–99)

## 2022-11-24 SURGERY — COLONOSCOPY WITH PROPOFOL
Anesthesia: General

## 2022-11-24 MED ORDER — LACTATED RINGERS IV SOLN: INTRAVENOUS | Status: DC

## 2022-11-24 MED ORDER — LIDOCAINE HCL (CARDIAC) PF 100 MG/5ML IV SOSY
PREFILLED_SYRINGE | INTRAVENOUS | Status: DC | PRN
  Administered 2022-11-24: 50 mg via INTRATRACHEAL

## 2022-11-24 MED ORDER — PROPOFOL 500 MG/50ML IV EMUL
INTRAVENOUS | Status: DC | PRN
  Administered 2022-11-24: 200 ug/kg/min via INTRAVENOUS

## 2022-11-24 MED ORDER — STERILE WATER FOR IRRIGATION IR SOLN
Status: DC | PRN
  Administered 2022-11-24: 60 mL

## 2022-11-24 MED ORDER — PHENYLEPHRINE HCL (PRESSORS) 10 MG/ML IV SOLN
INTRAVENOUS | Status: DC | PRN
  Administered 2022-11-24: 100 ug via INTRAVENOUS

## 2022-11-24 MED ORDER — PROPOFOL 10 MG/ML IV BOLUS
INTRAVENOUS | Status: DC | PRN
  Administered 2022-11-24: 100 mg via INTRAVENOUS

## 2022-11-24 NOTE — Anesthesia Postprocedure Evaluation (Signed)
Anesthesia Post Note  Patient: Jordan Perry  Procedure(s) Performed: COLONOSCOPY WITH PROPOFOL POLYPECTOMY INTESTINAL  Patient location during evaluation: Phase II Anesthesia Type: General Level of consciousness: awake and alert and oriented Pain management: pain level controlled Vital Signs Assessment: post-procedure vital signs reviewed and stable Respiratory status: spontaneous breathing, nonlabored ventilation and respiratory function stable Cardiovascular status: blood pressure returned to baseline and stable Postop Assessment: no apparent nausea or vomiting Anesthetic complications: no  No notable events documented.   Last Vitals:  Vitals:   11/24/22 1004 11/24/22 1158  BP: (!) (P) 85/44 (!) 105/48  Pulse: (P) 64 76  Resp: (P) 18 16  Temp: (P) 36.7 C (!) 36.4 C  SpO2: (P) 97% 98%    Last Pain:  Vitals:   11/24/22 1158  TempSrc: Axillary  PainSc: 0-No pain                 Lianny Molter C Odilia Damico

## 2022-11-24 NOTE — Progress Notes (Signed)
[facility] DEVICE PROGRAMMING  Patient Information: Name:  Jordan Perry  DOB:  01-31-1956  MRN:  [id number]   Colonoscopy Dr. Gala Romney 11/24/22 Device Information:  Clinic EP Physician:  Cristopher Peru, MD   Device Type:  Pacemaker Manufacturer and Phone #:  [employer]: [phone] Pacemaker Dependent?:  No. Date of Last Device Check:  09/19/2022 Normal Device Function?:  Yes.    Electrophysiologist's Recommendations:  Have magnet available. Provide continuous ECG monitoring when magnet is used or reprogramming is to be performed.  Procedure should not interfere with device function.  No device programming or magnet placement needed.  Per Device Clinic Standing Orders, Damian Leavell, RN  3:55 PM 11/24/2022

## 2022-11-24 NOTE — Transfer of Care (Signed)
Immediate Anesthesia Transfer of Care Note  Patient: Jordan Perry  Procedure(s) Performed: COLONOSCOPY WITH PROPOFOL POLYPECTOMY INTESTINAL  Patient Location: Short Stay  Anesthesia Type:General  Level of Consciousness: awake, alert , oriented, and patient cooperative  Airway & Oxygen Therapy: Patient Spontanous Breathing  Post-op Assessment: Report given to RN, Post -op Vital signs reviewed and stable, and Patient moving all extremities  Post vital signs: Reviewed and stable  Last Vitals:  Vitals Value Taken Time  BP    Temp    Pulse    Resp    SpO2      Last Pain:  Vitals:   11/24/22 1126  TempSrc:   PainSc: 0-No pain      Patients Stated Pain Goal: (P) 8 (52/58/94 8347)  Complications: No notable events documented.

## 2022-11-24 NOTE — Op Note (Signed)
[facility] Patient Name: Jordan Perry Procedure Date: 11/24/2022 10:48 AM MRN: [id number] Date of Birth: 02/15/1956 Attending MD: Norvel Richards , MD, [id number] CSN: [id number] Age: 67 Admit Type: Outpatient Procedure:                Colonoscopy Indications:              High risk colon cancer surveillance: Personal                            history of colonic polyps Providers:                Norvel Richards, MD, Crystal Page, Aram Candela Referring MD:              Medicines:                Propofol per Anesthesia Complications:            No immediate complications. Estimated Blood Loss:     Estimated blood loss was minimal. Estimated blood                            loss was minimal. Procedure:                Pre-Anesthesia Assessment:                           - Prior to the procedure, a History and Physical                            was performed, and patient medications and                            allergies were reviewed. The patient's tolerance of                            previous anesthesia was also reviewed. The risks                            and benefits of the procedure and the sedation                            options and risks were discussed with the patient.                            All questions were answered, and informed consent                            was obtained. Prior Anticoagulants: The patient has                            taken no anticoagulant or antiplatelet agents. ASA                            Grade Assessment: III - A patient with severe  systemic disease. After reviewing the risks and                            benefits, the patient was deemed in satisfactory                            condition to undergo the procedure.                           After obtaining informed consent, the colonoscope                            was passed under direct vision. Throughout the                             procedure, the patient's blood pressure, pulse, and                            oxygen saturations were monitored continuously. The                            [id number]) scope was introduced through the                            anus and advanced to the the cecum, identified by                            appendiceal orifice and ileocecal valve. The                            colonoscopy was performed without difficulty. The                            patient tolerated the procedure well. The quality                            of the bowel preparation was adequate. The                            ileocecal valve, appendiceal orifice, and rectum                            were photographed. The ileocecal valve, appendiceal                            orifice, and rectum were photographed. The entire                            colon was well visualized. Scope In: 11:30:18 AM Scope Out: 11:53:59 AM Scope Withdrawal Time: 0 hours 17 minutes 59 seconds  Total Procedure Duration: 0 hours 23 minutes 41 seconds  Findings:      The perianal and digital rectal examinations were normal.      Internal hemorrhoids and anal papilla were found during retroflexion.  The hemorrhoids were moderate, medium-sized and Grade I (internal       hemorrhoids that do not prolapse).      A few medium-mouthed diverticula were found in the sigmoid colon.      A 2 mm polyp was found in the cecum. The polyp was sessile. The polyp       was removed with a cold biopsy forceps. Resection and retrieval were       complete. Estimated blood loss was minimal.      A 4 mm polyp was found in the cecum. The polyp was sessile. The polyp       was removed with a cold snare. Resection and retrieval were complete.       Estimated blood loss was minimal.      The exam was otherwise without abnormality on direct and retroflexion       views. Impression:               - Internal hemorrhoids /anal papilla.                            - Diverticulosis in the sigmoid colon.                           - One 2 mm polyp in the cecum, removed with a cold                            biopsy forceps. Resected and retrieved.                           - One 4 mm polyp in the cecum, removed with a cold                            snare. Resected and retrieved.                           - The examination was otherwise normal on direct                            and retroflexion views. Moderate Sedation:      Moderate (conscious) sedation was personally administered by an       anesthesia professional. The following parameters were monitored: oxygen       saturation, heart rate, blood pressure, respiratory rate, EKG, adequacy       of pulmonary ventilation, and response to care. Recommendation:           - Patient has a contact number available for                            emergencies. The signs and symptoms of potential                            delayed complications were discussed with the                            patient. Return to normal activities tomorrow.  Written discharge instructions were provided to the                            patient.                           - Resume previous diet.                           - Repeat colonoscopy date to be determined after                            pending pathology results are reviewed for                            surveillance.                           - Return to GI office (date not yet determined). Procedure Code(s):        --- Professional ---                           [phone], Colonoscopy, flexible; with removal of                            tumor(s), polyp(s), or other lesion(s) by snare                            technique                           45380, 52, Colonoscopy, flexible; with biopsy,                            single or multiple Diagnosis Code(s):        --- Professional ---                           Z86.010, Personal  history of colonic polyps                           K64.0, First degree hemorrhoids                           D12.0, Benign neoplasm of cecum                           K57.30, Diverticulosis of large intestine without                            perforation or abscess without bleeding CPT copyright 2022 American Medical Association. All rights reserved. The codes documented in this report are preliminary and upon coder review may  be revised to meet current compliance requirements. Cristopher Estimable. Colton Engdahl, MD Norvel Richards, MD 11/24/2022 12:07:34 PM This report has been signed electronically. Number of Addenda: 0

## 2022-11-24 NOTE — Discharge Instructions (Signed)
  Colonoscopy Discharge Instructions  Read the instructions outlined below and refer to this sheet in the next few weeks. These discharge instructions provide you with general information on caring for yourself after you leave the hospital. Your doctor may also give you specific instructions. While your treatment has been planned according to the most current medical practices available, unavoidable complications occasionally occur. If you have any problems or questions after discharge, call Dr. Gala Romney at [phone]. ACTIVITY You may resume your regular activity, but move at a slower pace for the next 24 hours.  Take frequent rest periods for the next 24 hours.  Walking will help get rid of the air and reduce the bloated feeling in your belly (abdomen).  No driving for 24 hours (because of the medicine (anesthesia) used during the test).   Do not sign any important legal documents or operate any machinery for 24 hours (because of the anesthesia used during the test).  NUTRITION Drink plenty of fluids.  You may resume your normal diet as instructed by your doctor.  Begin with a light meal and progress to your normal diet. Heavy or fried foods are harder to digest and may make you feel sick to your stomach (nauseated).  Avoid alcoholic beverages for 24 hours or as instructed.  MEDICATIONS You may resume your normal medications unless your doctor tells you otherwise.  WHAT YOU CAN EXPECT TODAY Some feelings of bloating in the abdomen.  Passage of more gas than usual.  Spotting of blood in your stool or on the toilet paper.  IF YOU HAD POLYPS REMOVED DURING THE COLONOSCOPY: No aspirin products for 7 days or as instructed.  No alcohol for 7 days or as instructed.  Eat a soft diet for the next 24 hours.  FINDING OUT THE RESULTS OF YOUR TEST Not all test results are available during your visit. If your test results are not back during the visit, make an appointment with your caregiver to find out the  results. Do not assume everything is normal if you have not heard from your caregiver or the medical facility. It is important for you to follow up on all of your test results.  SEEK IMMEDIATE MEDICAL ATTENTION IF: You have more than a spotting of blood in your stool.  Your belly is swollen (abdominal distention).  You are nauseated or vomiting.  You have a temperature over 101.  You have abdominal pain or discomfort that is severe or gets worse throughout the day.      2 small polyps removed from your colon today diverticulosis present.  Diverticulosis and colon polyp information provided  Further recommendations to follow pending review of pathology report  At patient request, I called Dedra Skeens at [phone] -

## 2022-11-24 NOTE — Telephone Encounter (Signed)
Pt was concerned with the payment she was going to have to do for her colonoscopy. She said it needed to be a screening, not history of polyps.   Advised pt talked with her insurance company and if it is coded as a surveillance with history of polyps, it will still a diagnostic test. We can't change that to a screening because that would be fraudulent. Pt wasn't happy but understood.

## 2022-11-24 NOTE — Anesthesia Preprocedure Evaluation (Signed)
Anesthesia Evaluation  Patient identified by MRN, date of birth, ID band Patient awake    Reviewed: Allergy & Precautions, H&P , NPO status , Patient's Chart, lab work & pertinent test results  Airway Mallampati: III  TM Distance: >3 FB Neck ROM: Full    Dental  (+) Dental Advisory Given, Implants, Missing   Pulmonary neg pulmonary ROS   Pulmonary exam normal breath sounds clear to auscultation       Cardiovascular Exercise Tolerance: Good hypertension, Pt. on medications + CAD  Normal cardiovascular exam+ dysrhythmias + pacemaker (complete heart block)  Rhythm:Regular Rate:Normal  H/o complete heart block pacemaker   Neuro/Psych negative neurological ROS  negative psych ROS   GI/Hepatic Neg liver ROS,GERD  Medicated,,  Endo/Other  diabetes, Well Controlled, Type 2, Oral Hypoglycemic AgentsHypothyroidism    Renal/GU negative Renal ROS  negative genitourinary   Musculoskeletal negative musculoskeletal ROS (+)    Abdominal   Peds negative pediatric ROS (+)  Hematology negative hematology ROS (+)   Anesthesia Other Findings   Reproductive/Obstetrics negative OB ROS                              Anesthesia Physical Anesthesia Plan  ASA: 3  Anesthesia Plan: General   Post-op Pain Management: Minimal or no pain anticipated   Induction: Intravenous  PONV Risk Score and Plan: 1 and Propofol infusion  Airway Management Planned: Nasal Cannula and Natural Airway  Additional Equipment:   Intra-op Plan:   Post-operative Plan:   Informed Consent: I have reviewed the patients History and Physical, chart, labs and discussed the procedure including the risks, benefits and alternatives for the proposed anesthesia with the patient or authorized representative who has indicated his/her understanding and acceptance.     Dental advisory given  Plan Discussed with: CRNA and  Surgeon  Anesthesia Plan Comments:          Anesthesia Quick Evaluation

## 2022-11-24 NOTE — H&P (Signed)
$'@LOGO'D$ @   Primary Care Physician:  Sharilyn Sites, MD Primary Gastroenterologist:  Dr. Gala Romney  Pre-Procedure History & Physical: HPI:  Jordan Perry is a 67 y.o. female here for   A surveillance colonoscopy history of multiple (11) adenomas removed from her colon 2020.  She is here for surveillance.  No bowel symptoms at this time.  Past Medical History:  Diagnosis Date   Allergic rhinitis, cause unspecified    Arthritis    Bradycardia    Coronary atherosclerosis of unspecified type of vessel, native or graft    Dysrhythmia    GERD (gastroesophageal reflux disease)    Hypothyroidism    Presence of permanent cardiac pacemaker    Proteinuria    Pure hyperglyceridemia    Type II or unspecified type diabetes mellitus without mention of complication, not stated as uncontrolled    Unspecified essential hypertension     Past Surgical History:  Procedure Laterality Date   COLONOSCOPY N/A 10/02/2019   Procedure: COLONOSCOPY;  Surgeon: Daneil Dolin, MD;  [facility];  Service: Endoscopy;  Laterality: N/A;  9:30   CYSTECTOMY  1989   removed from end of spine   PACEMAKER IMPLANT N/A 12/13/2018   Procedure: PACEMAKER IMPLANT;  Surgeon: Evans Lance, MD;  Location: [facility];  Service: Cardiovascular;  Laterality: N/A;   PACEMAKER INSERTION     POLYPECTOMY  10/02/2019   Procedure: POLYPECTOMY;  Surgeon: Daneil Dolin, MD;  [facility]: [facility];  Service: Endoscopy;;   REFRACTIVE SURGERY  1994   Right lens implant      Prior to Admission medications   Medication Sig Start Date End Date Taking? Authorizing Provider  aspirin EC 81 MG tablet Take 81 mg by mouth daily.   Yes [provider]  atorvastatin (LIPITOR) 80 MG tablet TAKE 1 TABLET AT BEDTIME 09/19/22  Yes Branch, Alphonse Guild, MD  brimonidine (ALPHAGAN) 0.2 % ophthalmic solution Place 1 drop into the right eye 2 (two) times daily. 08/01/22  Yes [provider]  dorzolamide-timolol  (COSOPT) 2-0.5 % ophthalmic solution Place 1 drop into the right eye 2 (two) times daily. 11/15/22  Yes [provider]  furosemide (LASIX) 20 MG tablet TAKE 1 TABLET AS NEEDED 07/15/15  Yes Herminio Commons, MD  HUMULIN N KWIKPEN 100 UNIT/ML Kiwkpen Inject 20-30 Units into the skin in the morning and at bedtime. 12/20/17  Yes [provider]  insulin lispro (HUMALOG) 100 UNIT/ML KwikPen Inject 15-25 Units into the skin 3 (three) times daily.   Yes [provider]  latanoprost (XALATAN) 0.005 % ophthalmic solution Place 1 drop into the right eye at bedtime. 09/01/22  Yes [provider]  levothyroxine (SYNTHROID) 125 MCG tablet Take 125 mcg by mouth daily before breakfast. 11/22/17  Yes [provider]  lisinopril (ZESTRIL) 5 MG tablet Take 5 mg by mouth daily.   Yes [provider]  metFORMIN (GLUCOPHAGE-XR) 500 MG 24 hr tablet Take 500 mg by mouth 2 (two) times daily after a meal.  09/07/13  Yes [provider]  MOUNJARO 7.5 MG/0.5ML Pen Inject 7.5 mg into the skin once a week. 09/09/22  Yes [provider]  Multiple Vitamins-Minerals (CENTRUM SILVER 50+WOMEN) TABS Take 1 tablet by mouth daily.   Yes [provider]  Polyethyl Glycol-Propyl Glycol (SYSTANE OP) Place 1 drop into both eyes as needed (dry eyes).   Yes [provider]  Probiotic Product (ALIGN) 4 MG CAPS Take 1 capsule by  mouth daily.   Yes [provider]  lisinopril (ZESTRIL) 10 MG tablet Take 1 tablet (10 mg total) by mouth daily. Patient not taking: Reported on 11/17/2022 03/11/22   Arnoldo Lenis, MD  Na Sulfate-K Sulfate-Mg Sulf 17.5-3.13-1.6 GM/177ML SOLN As directed 11/09/22   Daneil Dolin, MD    Allergies as of 11/09/2022 - Review Complete 11/09/2022  Allergen Reaction Noted   Invokana [canagliflozin] Other (See Comments) 08/28/2015   Janumet [sitagliptin-metformin hcl] Other (See Comments) 08/28/2015   Cephalexin Other  (See Comments) 07/31/2019   Penicillins     Prednisone Rash     Family History  Problem Relation Age of Onset   COPD Mother 4   Heart disease Father    Cancer Father 71   Coronary artery disease Other    Diabetes Other    Obesity Other    Heart attack Brother 82   Coronary artery disease Sister    Hyperlipidemia Other    Hypertension Other    Colon cancer Neg Hx     Social History   Socioeconomic History   Marital status: Married    Spouse name: Not on file   Number of children: 0   Years of education: Not on file   Highest education level: Not on file  Occupational History   Occupation: Producer, 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[employer]    Comment: , 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  Tobacco Use   Smoking status: Never   Smokeless tobacco: Never  Vaping Use   Vaping Use: Never used  Substance and Sexual Activity   Alcohol use: No    Alcohol/week: 0.0 standard drinks of alcohol   Drug use: No   Sexual activity: Not on file  Other Topics Concern   Not on file  Social History Narrative   Not on file   Social Determinants of Health   Financial Resource Strain: Not on file  Food Insecurity: Not on file  Transportation Needs: Not on file  Physical Activity: Not on file  Stress: Not on file  Social Connections: Not on file  Intimate Partner Violence: Not on file    Review of Systems: See HPI, otherwise negative ROS  Physical Exam: BP (!) (P) 85/44   Pulse (P) 64   Temp (P) 98.1 F (36.7 C) (Oral)   Resp (P) 18   Ht (P) '5\' 4"'$  (1.626 m)   Wt (P) 86 kg   SpO2 (P) 97%   BMI (P) 32.54 kg/m  General:   Alert,  Well-developed, well-nourished, pleasant and cooperative in NAD SNeck:  Supple; no masses or thyromegaly. No significant cervical adenopathy. Lungs:  Clear throughout to auscultation.   No wheezes, crackles, or rhonchi. No acute distress. Heart:  Regular rate and rhythm; no murmurs, clicks, rubs,  or gallops. Abdomen: Non-distended, normal bowel sounds.  Soft and nontender  without appreciable mass or hepatosplenomegaly.  Pulses:  Normal pulses noted. Extremities:  Without clubbing or edema.  Impression/Plan:    67 year old lady here for surveillance colonoscopy.  History of multiple colonic adenomas removed previously.   I have offered the patient a surveillance colonoscopy today. The risks, benefits, limitations, alternatives and imponderables have been reviewed with the patient. Questions have been answered. All parties are agreeable.       Notice: This dictation was prepared with Dragon dictation along with smaller phrase technology. Any transcriptional errors that result from this process are unintentional and may not be corrected upon review.

## 2022-11-25 ENCOUNTER — Encounter: Payer: Self-pay | Admitting: Internal Medicine

## 2022-11-25 LAB — SURGICAL PATHOLOGY

## 2022-12-01 ENCOUNTER — Encounter (HOSPITAL_COMMUNITY): Payer: Self-pay | Admitting: Internal Medicine

## 2022-12-01 DIAGNOSIS — M9901 Segmental and somatic dysfunction of cervical region: Secondary | ICD-10-CM | POA: Diagnosis not present

## 2022-12-01 DIAGNOSIS — M5033 Other cervical disc degeneration, cervicothoracic region: Secondary | ICD-10-CM | POA: Diagnosis not present

## 2022-12-07 DIAGNOSIS — M5033 Other cervical disc degeneration, cervicothoracic region: Secondary | ICD-10-CM | POA: Diagnosis not present

## 2022-12-07 DIAGNOSIS — M9901 Segmental and somatic dysfunction of cervical region: Secondary | ICD-10-CM | POA: Diagnosis not present

## 2022-12-13 DIAGNOSIS — M5033 Other cervical disc degeneration, cervicothoracic region: Secondary | ICD-10-CM | POA: Diagnosis not present

## 2022-12-13 DIAGNOSIS — M9901 Segmental and somatic dysfunction of cervical region: Secondary | ICD-10-CM | POA: Diagnosis not present

## 2022-12-21 ENCOUNTER — Ambulatory Visit: Payer: BC Managed Care – PPO

## 2022-12-21 DIAGNOSIS — I442 Atrioventricular block, complete: Secondary | ICD-10-CM | POA: Diagnosis not present

## 2022-12-22 ENCOUNTER — Encounter: Payer: Self-pay | Admitting: Radiology

## 2022-12-22 DIAGNOSIS — M9901 Segmental and somatic dysfunction of cervical region: Secondary | ICD-10-CM | POA: Diagnosis not present

## 2022-12-22 DIAGNOSIS — M5033 Other cervical disc degeneration, cervicothoracic region: Secondary | ICD-10-CM | POA: Diagnosis not present

## 2022-12-22 LAB — CUP PACEART REMOTE DEVICE CHECK
Battery Remaining Longevity: 82 mo
Battery Remaining Percentage: 61 %
Battery Voltage: 2.99 V
Brady Statistic AP VP Percent: 1 %
Brady Statistic AP VS Percent: 1 %
Brady Statistic AS VP Percent: 1 %
Brady Statistic AS VS Percent: 98 %
Brady Statistic RA Percent Paced: 1 %
Brady Statistic RV Percent Paced: 1 %
Date Time Interrogation Session: 20240228020015
Implantable Lead Connection Status: 753985
Implantable Lead Connection Status: 753985
Implantable Lead Implant Date: 20200220
Implantable Lead Implant Date: 20200220
Implantable Lead Location: 753859
Implantable Lead Location: 753860
Implantable Pulse Generator Implant Date: 20200220
Lead Channel Impedance Value: 540 Ohm
Lead Channel Impedance Value: 610 Ohm
Lead Channel Pacing Threshold Amplitude: 0.5 V
Lead Channel Pacing Threshold Amplitude: 0.75 V
Lead Channel Pacing Threshold Pulse Width: 0.5 ms
Lead Channel Pacing Threshold Pulse Width: 0.5 ms
Lead Channel Sensing Intrinsic Amplitude: 12 mV
Lead Channel Sensing Intrinsic Amplitude: 5 mV
Lead Channel Setting Pacing Amplitude: 1 V
Lead Channel Setting Pacing Amplitude: 1.5 V
Lead Channel Setting Pacing Pulse Width: 0.5 ms
Lead Channel Setting Sensing Sensitivity: 4 mV
Pulse Gen Model: 2272
Pulse Gen Serial Number: 9107223

## 2022-12-29 DIAGNOSIS — M5033 Other cervical disc degeneration, cervicothoracic region: Secondary | ICD-10-CM | POA: Diagnosis not present

## 2022-12-29 DIAGNOSIS — M9901 Segmental and somatic dysfunction of cervical region: Secondary | ICD-10-CM | POA: Diagnosis not present

## 2023-01-11 DIAGNOSIS — M9901 Segmental and somatic dysfunction of cervical region: Secondary | ICD-10-CM | POA: Diagnosis not present

## 2023-01-11 DIAGNOSIS — M5033 Other cervical disc degeneration, cervicothoracic region: Secondary | ICD-10-CM | POA: Diagnosis not present

## 2023-01-17 ENCOUNTER — Ambulatory Visit (HOSPITAL_COMMUNITY)
Admission: RE | Admit: 2023-01-17 | Discharge: 2023-01-17 | Disposition: A | Discharge status: Home / Self Care | Payer: BC Managed Care – PPO | Source: Ambulatory Visit | Attending: Family Medicine | Admitting: Family Medicine

## 2023-01-17 ENCOUNTER — Other Ambulatory Visit (HOSPITAL_COMMUNITY): Payer: Self-pay | Admitting: Family Medicine

## 2023-01-17 DIAGNOSIS — E118 Type 2 diabetes mellitus with unspecified complications: Secondary | ICD-10-CM | POA: Diagnosis not present

## 2023-01-17 DIAGNOSIS — R1031 Right lower quadrant pain: Secondary | ICD-10-CM | POA: Insufficient documentation

## 2023-01-17 DIAGNOSIS — R109 Unspecified abdominal pain: Secondary | ICD-10-CM | POA: Diagnosis not present

## 2023-01-17 DIAGNOSIS — Z6833 Body mass index (BMI) 33.0-33.9, adult: Secondary | ICD-10-CM | POA: Diagnosis not present

## 2023-01-17 DIAGNOSIS — E6609 Other obesity due to excess calories: Secondary | ICD-10-CM | POA: Diagnosis not present

## 2023-01-17 LAB — POCT I-STAT CREATININE: Creatinine, Ser: 0.7 mg/dL (ref 0.44–1.00)

## 2023-01-17 MED ORDER — IOHEXOL 300 MG/ML  SOLN
100.0000 mL | Freq: Once | INTRAMUSCULAR | Status: AC | PRN
  Administered 2023-01-17: 100 mL via INTRAVENOUS

## 2023-01-19 DIAGNOSIS — M5033 Other cervical disc degeneration, cervicothoracic region: Secondary | ICD-10-CM | POA: Diagnosis not present

## 2023-01-19 DIAGNOSIS — M9901 Segmental and somatic dysfunction of cervical region: Secondary | ICD-10-CM | POA: Diagnosis not present

## 2023-01-24 NOTE — Progress Notes (Signed)
Remote pacemaker transmission.   

## 2023-01-26 DIAGNOSIS — M9901 Segmental and somatic dysfunction of cervical region: Secondary | ICD-10-CM | POA: Diagnosis not present

## 2023-01-26 DIAGNOSIS — M5033 Other cervical disc degeneration, cervicothoracic region: Secondary | ICD-10-CM | POA: Diagnosis not present

## 2023-02-02 DIAGNOSIS — M9901 Segmental and somatic dysfunction of cervical region: Secondary | ICD-10-CM | POA: Diagnosis not present

## 2023-02-02 DIAGNOSIS — M5033 Other cervical disc degeneration, cervicothoracic region: Secondary | ICD-10-CM | POA: Diagnosis not present

## 2023-02-09 DIAGNOSIS — M5033 Other cervical disc degeneration, cervicothoracic region: Secondary | ICD-10-CM | POA: Diagnosis not present

## 2023-02-09 DIAGNOSIS — M9901 Segmental and somatic dysfunction of cervical region: Secondary | ICD-10-CM | POA: Diagnosis not present

## 2023-02-13 DIAGNOSIS — M9901 Segmental and somatic dysfunction of cervical region: Secondary | ICD-10-CM | POA: Diagnosis not present

## 2023-02-13 DIAGNOSIS — M5033 Other cervical disc degeneration, cervicothoracic region: Secondary | ICD-10-CM | POA: Diagnosis not present

## 2023-02-14 DIAGNOSIS — R809 Proteinuria, unspecified: Secondary | ICD-10-CM | POA: Diagnosis not present

## 2023-02-14 DIAGNOSIS — I10 Essential (primary) hypertension: Secondary | ICD-10-CM | POA: Diagnosis not present

## 2023-02-14 DIAGNOSIS — E1165 Type 2 diabetes mellitus with hyperglycemia: Secondary | ICD-10-CM | POA: Diagnosis not present

## 2023-02-14 DIAGNOSIS — E11319 Type 2 diabetes mellitus with unspecified diabetic retinopathy without macular edema: Secondary | ICD-10-CM | POA: Diagnosis not present

## 2023-02-14 DIAGNOSIS — E039 Hypothyroidism, unspecified: Secondary | ICD-10-CM | POA: Diagnosis not present

## 2023-02-21 DIAGNOSIS — I10 Essential (primary) hypertension: Secondary | ICD-10-CM | POA: Diagnosis not present

## 2023-02-21 DIAGNOSIS — E1165 Type 2 diabetes mellitus with hyperglycemia: Secondary | ICD-10-CM | POA: Diagnosis not present

## 2023-02-21 DIAGNOSIS — R809 Proteinuria, unspecified: Secondary | ICD-10-CM | POA: Diagnosis not present

## 2023-02-21 DIAGNOSIS — E039 Hypothyroidism, unspecified: Secondary | ICD-10-CM | POA: Diagnosis not present

## 2023-02-23 DIAGNOSIS — M5033 Other cervical disc degeneration, cervicothoracic region: Secondary | ICD-10-CM | POA: Diagnosis not present

## 2023-02-23 DIAGNOSIS — M9901 Segmental and somatic dysfunction of cervical region: Secondary | ICD-10-CM | POA: Diagnosis not present

## 2023-03-02 DIAGNOSIS — M5033 Other cervical disc degeneration, cervicothoracic region: Secondary | ICD-10-CM | POA: Diagnosis not present

## 2023-03-02 DIAGNOSIS — M9901 Segmental and somatic dysfunction of cervical region: Secondary | ICD-10-CM | POA: Diagnosis not present

## 2023-03-09 DIAGNOSIS — M9901 Segmental and somatic dysfunction of cervical region: Secondary | ICD-10-CM | POA: Diagnosis not present

## 2023-03-09 DIAGNOSIS — M5033 Other cervical disc degeneration, cervicothoracic region: Secondary | ICD-10-CM | POA: Diagnosis not present

## 2023-03-15 ENCOUNTER — Other Ambulatory Visit: Payer: Self-pay | Admitting: Cardiology

## 2023-03-22 ENCOUNTER — Ambulatory Visit (INDEPENDENT_AMBULATORY_CARE_PROVIDER_SITE_OTHER): Payer: BC Managed Care – PPO

## 2023-03-22 DIAGNOSIS — I442 Atrioventricular block, complete: Secondary | ICD-10-CM

## 2023-03-22 LAB — CUP PACEART REMOTE DEVICE CHECK
Battery Remaining Longevity: 79 mo
Battery Remaining Percentage: 59 %
Battery Voltage: 2.99 V
Brady Statistic AP VP Percent: 1 %
Brady Statistic AP VS Percent: 1 %
Brady Statistic AS VP Percent: 1 %
Brady Statistic AS VS Percent: 98 %
Brady Statistic RA Percent Paced: 1 %
Brady Statistic RV Percent Paced: 1 %
Date Time Interrogation Session: 20240529020015
Implantable Lead Connection Status: 753985
Implantable Lead Connection Status: 753985
Implantable Lead Implant Date: 20200220
Implantable Lead Implant Date: 20200220
Implantable Lead Location: 753859
Implantable Lead Location: 753860
Implantable Pulse Generator Implant Date: 20200220
Lead Channel Impedance Value: 510 Ohm
Lead Channel Impedance Value: 540 Ohm
Lead Channel Pacing Threshold Amplitude: 0.375 V
Lead Channel Pacing Threshold Amplitude: 0.625 V
Lead Channel Pacing Threshold Pulse Width: 0.5 ms
Lead Channel Pacing Threshold Pulse Width: 0.5 ms
Lead Channel Sensing Intrinsic Amplitude: 11.3 mV
Lead Channel Sensing Intrinsic Amplitude: 2.8 mV
Lead Channel Setting Pacing Amplitude: 0.875
Lead Channel Setting Pacing Amplitude: 1.375
Lead Channel Setting Pacing Pulse Width: 0.5 ms
Lead Channel Setting Sensing Sensitivity: 4 mV
Pulse Gen Model: 2272
Pulse Gen Serial Number: 9107223

## 2023-03-27 DIAGNOSIS — H40022 Open angle with borderline findings, high risk, left eye: Secondary | ICD-10-CM | POA: Diagnosis not present

## 2023-03-27 DIAGNOSIS — T85398A Other mechanical complication of other ocular prosthetic devices, implants and grafts, initial encounter: Secondary | ICD-10-CM | POA: Diagnosis not present

## 2023-03-27 DIAGNOSIS — H209 Unspecified iridocyclitis: Secondary | ICD-10-CM | POA: Diagnosis not present

## 2023-03-27 DIAGNOSIS — H4041X0 Glaucoma secondary to eye inflammation, right eye, stage unspecified: Secondary | ICD-10-CM | POA: Diagnosis not present

## 2023-03-28 ENCOUNTER — Encounter: Payer: Self-pay | Admitting: Cardiology

## 2023-03-28 ENCOUNTER — Ambulatory Visit: Payer: BC Managed Care – PPO | Attending: Cardiology | Admitting: Cardiology

## 2023-03-28 VITALS — BP 122/70 | HR 66 | Ht 64.0 in | Wt 196.0 lb

## 2023-03-28 DIAGNOSIS — M5033 Other cervical disc degeneration, cervicothoracic region: Secondary | ICD-10-CM | POA: Diagnosis not present

## 2023-03-28 DIAGNOSIS — I442 Atrioventricular block, complete: Secondary | ICD-10-CM

## 2023-03-28 DIAGNOSIS — Z8249 Family history of ischemic heart disease and other diseases of the circulatory system: Secondary | ICD-10-CM

## 2023-03-28 DIAGNOSIS — I10 Essential (primary) hypertension: Secondary | ICD-10-CM

## 2023-03-28 DIAGNOSIS — E118 Type 2 diabetes mellitus with unspecified complications: Secondary | ICD-10-CM

## 2023-03-28 DIAGNOSIS — E782 Mixed hyperlipidemia: Secondary | ICD-10-CM | POA: Diagnosis not present

## 2023-03-28 DIAGNOSIS — Z794 Long term (current) use of insulin: Secondary | ICD-10-CM

## 2023-03-28 DIAGNOSIS — E039 Hypothyroidism, unspecified: Secondary | ICD-10-CM

## 2023-03-28 DIAGNOSIS — M9901 Segmental and somatic dysfunction of cervical region: Secondary | ICD-10-CM | POA: Diagnosis not present

## 2023-03-28 NOTE — Patient Instructions (Signed)
Medication Instructions:  Your physician recommends that you continue on your current medications as directed. Please refer to the Current Medication list given to you today.  *If you need a refill on your cardiac medications before your next appointment, please call your pharmacy*   Lab Work: None If you have labs (blood work) drawn today and your tests are completely normal, you will receive your results only by: MyChart Message (if you have MyChart) OR A paper copy in the mail If you have any lab test that is abnormal or we need to change your treatment, we will call you to review the results.   Testing/Procedures: None   Follow-Up: At [facility], you and your health needs are our priority.  As part of our continuing mission to provide you with exceptional heart care, we have created designated Provider Care Teams.  These Care Teams include your primary Cardiologist (physician) and Advanced Practice Providers (APPs -  Physician Assistants and Nurse Practitioners) who all work together to provide you with the care you need, when you need it.  We recommend signing up for the patient portal called "MyChart".  Sign up information is provided on this After Visit Summary.  MyChart is used to connect with patients for Virtual Visits (Telemedicine).  Patients are able to view lab/test results, encounter notes, upcoming appointments, etc.  Non-urgent messages can be sent to your provider as well.   To learn more about what you can do with MyChart, go to [email or web address].    Your next appointment:   1 year(s)  Provider:   Jonathan Branch, MD    Other Instructions    

## 2023-03-28 NOTE — Progress Notes (Signed)
Cardiology Office Note:   Date:  03/28/2023  ID:  Jordan Perry, DOB 07-11-1956, MRN [id number] PCP: Assunta Found, MD  [facility] Providers Cardiologist:  Dina Rich, MD    History of Present Illness:   Jordan Perry is a 67 y.o. female with a past medical history of hypertension, hyperlipidemia, type 2 diabetes, bilateral lower extremity edema, complete heart block status post pacemaker followed by EP and family history of coronary artery disease, who is here today to follow-up.  Nuclear stress testing completed in 09/2018 was considered a low risk study.  Echocardiogram completed 11/2018 revealed an LVEF of 60-65%, mild pulmonary hypertension, without valvular abnormalities.  She was last seen in clinic 03/11/2022 by Dr. Wyline Mood.  She had no complaints of chest pain or shortness of breath.  Stated she occasionally had low blood pressures at home.-She had had 30 pounds of weight loss since 06/2021.  She returns clinic today stating that she has been doing well.  She denies any chest pain, shortness of breath, palpitations, peripheral edema.  Recently had a colonoscopy with Dr. Jena Gauss and she reports that she was advised that she would not have to have another one for 5 years which she is excited about.  She has lost approximately 70 pounds since starting on her weight loss journey.  She denies any hospitalizations or visits to the emergency department.  She is also fully retired from her position at [employer].  ROS: 10 point review of systems has been completed and considered negative with the exception of what is listed in the HPI  Studies Reviewed:    EKG: Sinus rhythm with a rate of 66, rightward axis, no acute change from prior studies  Risk Assessment/Calculations:              Physical Exam:   VS:  BP 122/70   Pulse 66   Ht 5\' 4"  (1.626 m)   Wt 196 lb (88.9 kg)   SpO2 97%   BMI 33.64 kg/m    Wt Readings from Last 3 Encounters:  03/28/23 196 lb (88.9 kg)   11/24/22 (P) 189 lb 9.5 oz (86 kg)  11/22/22 190 lb (86.2 kg)     GEN: Well nourished, well developed in no acute distress NECK: No JVD; No carotid bruits CARDIAC: RRR, no murmurs, rubs, gallops RESPIRATORY:  Clear to auscultation without rales, wheezing or rhonchi  ABDOMEN: Soft, non-tender, non-distended EXTREMITIES:  No edema; No deformity   ASSESSMENT AND PLAN:   Essential hypertension with blood pressure today 122/70.  She has been compliant with medications.  She is continued on furosemide 20 mg as needed and lisinopril 5 mg daily.  She continues to monitor her blood pressure at home with no concerns of occasional lightheadedness.  Hyperlipidemia with last LDL of 61.  Which is a slight increase from 2022.  Atorvastatin 80 mg daily.  Continues to be monitored by her PCP.  Complete heart block status post pacemaker.  She continues to be followed by Dr. Ladona Ridgel from EP.  Device was placed on 11/2018.  She has had no recent symptoms and her last device check was normal.  Type 2 diabetes with last A1c of 8.4.  She is continued on Humulin sliding scale with meals and at bedtime, metformin, and majora.  This continues to be managed by her PCP.  Hypothyroidism continued on levothyroxine.  This continues to be managed by her PCP.  Strong family history of coronary artery disease with a history of  remote cath and seeing Dr. Dan Humphreys no significant CAD.  Normal stress Myoview in 2009.  She remains on aspirin 81 mg daily and atorvastatin 80 mg daily.  Disposition patient return to clinic to see MD/APP in 1 year or sooner if needed with EKG on return.        Signed, Melia Hopes, NP

## 2023-04-04 DIAGNOSIS — M5033 Other cervical disc degeneration, cervicothoracic region: Secondary | ICD-10-CM | POA: Diagnosis not present

## 2023-04-04 DIAGNOSIS — M9901 Segmental and somatic dysfunction of cervical region: Secondary | ICD-10-CM | POA: Diagnosis not present

## 2023-04-13 DIAGNOSIS — M5033 Other cervical disc degeneration, cervicothoracic region: Secondary | ICD-10-CM | POA: Diagnosis not present

## 2023-04-13 DIAGNOSIS — M9901 Segmental and somatic dysfunction of cervical region: Secondary | ICD-10-CM | POA: Diagnosis not present

## 2023-04-13 NOTE — Progress Notes (Signed)
Remote pacemaker transmission.   

## 2023-04-18 ENCOUNTER — Encounter: Payer: Self-pay | Admitting: Internal Medicine

## 2023-04-18 ENCOUNTER — Ambulatory Visit: Payer: BC Managed Care – PPO | Attending: Internal Medicine | Admitting: Internal Medicine

## 2023-04-18 VITALS — BP 86/56 | HR 97 | Ht 64.0 in | Wt 193.0 lb

## 2023-04-18 DIAGNOSIS — I442 Atrioventricular block, complete: Secondary | ICD-10-CM

## 2023-04-18 LAB — CUP PACEART INCLINIC DEVICE CHECK
Battery Remaining Longevity: 80 mo
Battery Voltage: 2.99 V
Brady Statistic RA Percent Paced: 0.75 %
Brady Statistic RV Percent Paced: 0.69 %
Date Time Interrogation Session: 20240625170139
Implantable Lead Connection Status: 753985
Implantable Lead Connection Status: 753985
Implantable Lead Implant Date: 20200220
Implantable Lead Implant Date: 20200220
Implantable Lead Location: 753859
Implantable Lead Location: 753860
Implantable Pulse Generator Implant Date: 20200220
Lead Channel Impedance Value: 462.5 Ohm
Lead Channel Impedance Value: 525 Ohm
Lead Channel Pacing Threshold Amplitude: 0.5 V
Lead Channel Pacing Threshold Amplitude: 0.5 V
Lead Channel Pacing Threshold Amplitude: 0.75 V
Lead Channel Pacing Threshold Amplitude: 0.75 V
Lead Channel Pacing Threshold Pulse Width: 0.5 ms
Lead Channel Pacing Threshold Pulse Width: 0.5 ms
Lead Channel Pacing Threshold Pulse Width: 0.5 ms
Lead Channel Pacing Threshold Pulse Width: 0.5 ms
Lead Channel Sensing Intrinsic Amplitude: 3.1 mV
Lead Channel Sensing Intrinsic Amplitude: 7.4 mV
Lead Channel Setting Pacing Amplitude: 0.875
Lead Channel Setting Pacing Amplitude: 1.375
Lead Channel Setting Pacing Pulse Width: 0.5 ms
Lead Channel Setting Sensing Sensitivity: 3 mV
Pulse Gen Model: 2272
Pulse Gen Serial Number: 9107223

## 2023-04-18 MED ORDER — LISINOPRIL 2.5 MG PO TABS: 2.5000 mg | ORAL_TABLET | Freq: Every day | ORAL | 3 refills | Status: DC

## 2023-04-18 NOTE — Patient Instructions (Signed)
Medication Instructions:  DECREASE Lisinopril to 2.5 mg daily   Labwork: None today  Testing/Procedures: None today  Follow-Up: 1 year  Any Other Special Instructions Will Be Listed Below (If Applicable).  If you need a refill on your cardiac medications before your next appointment, please call your pharmacy.

## 2023-04-18 NOTE — Progress Notes (Signed)
HPI Mrs. Mcomber returns today for followup of CHB, s/p PPM insertion. She has morbid obesity and has done well since her PPM. She has undergone dental implants. She denies chest pain or sob. She had lost weight, over 60 lbs. And almost 30 pounds in just the last year. She denies edema or sob . She is still working part time. Her dose of lisinopril and her insulin have been decreased. She feels well.  Allergies  Allergen Reactions   Invokana [Canagliflozin] Other (See Comments)    Skin peeling   Janumet [Sitagliptin-Metformin Hcl] Other (See Comments)    Skin peeling   Cephalexin Other (See Comments)   Penicillins     Has patient had a PCN reaction causing immediate rash, facial/tongue/throat swelling, SOB or lightheadedness with hypotension: Yes-immediate rash Has patient had a PCN reaction causing severe rash involving mucus membranes or skin necrosis: Yes Has patient had a PCN reaction that required hospitalization: No Has patient had a PCN reaction occurring within the last 10 years: No If all of the above answers are "NO", then may proceed with Cephalosporin use.    Prednisone Rash     Current Outpatient Medications  Medication Sig Dispense Refill   aspirin EC 81 MG tablet Take 81 mg by mouth daily.     atorvastatin (LIPITOR) 80 MG tablet TAKE 1 TABLET AT BEDTIME 90 tablet 3   brimonidine (ALPHAGAN) 0.2 % ophthalmic solution Place 1 drop into the right eye 2 (two) times daily.     dorzolamide-timolol (COSOPT) 2-0.5 % ophthalmic solution Place 1 drop into the right eye 2 (two) times daily.     furosemide (LASIX) 20 MG tablet TAKE 1 TABLET AS NEEDED 90 tablet 1   HUMULIN N KWIKPEN 100 UNIT/ML Kiwkpen Inject 20-30 Units into the skin in the morning and at bedtime.     insulin lispro (HUMALOG) 100 UNIT/ML KwikPen Inject 15-25 Units into the skin 3 (three) times daily.     latanoprost (XALATAN) 0.005 % ophthalmic solution Place 1 drop into the right eye at bedtime.      lisinopril (ZESTRIL) 5 MG tablet Take 5 mg by mouth daily.     metFORMIN (GLUCOPHAGE-XR) 500 MG 24 hr tablet Take 500 mg by mouth 2 (two) times daily after a meal.      MOUNJARO 7.5 MG/0.5ML Pen Inject 7.5 mg into the skin once a week.     Multiple Vitamins-Minerals (CENTRUM SILVER 50+WOMEN) TABS Take 1 tablet by mouth daily.     Polyethyl Glycol-Propyl Glycol (SYSTANE OP) Place 1 drop into both eyes as needed (dry eyes).     Probiotic Product (ALIGN) 4 MG CAPS Take 1 capsule by mouth daily.     SYNTHROID 112 MCG tablet Take 112 mcg by mouth daily.     No current facility-administered medications for this visit.     Past Medical History:  Diagnosis Date   Allergic rhinitis, cause unspecified    Arthritis    Bradycardia    Coronary atherosclerosis of unspecified type of vessel, native or graft    Dysrhythmia    GERD (gastroesophageal reflux disease)    Hypothyroidism    Presence of permanent cardiac pacemaker    Proteinuria    Pure hyperglyceridemia    Type II or unspecified type diabetes mellitus without mention of complication, not stated as uncontrolled    Unspecified essential hypertension     ROS:   All systems reviewed and negative except as noted in the HPI.  Past Surgical History:  Procedure Laterality Date   COLONOSCOPY N/A 10/02/2019   Procedure: COLONOSCOPY;  Surgeon: Corbin Ade, MD;  [facility];  Service: Endoscopy;  Laterality: N/A;  9:30   COLONOSCOPY WITH PROPOFOL N/A 11/24/2022   Procedure: COLONOSCOPY WITH PROPOFOL;  Surgeon: Corbin Ade, MD;  [facility];  Service: Endoscopy;  Laterality: N/A;  830am, asa 3   CYSTECTOMY  1989   removed from end of spine   PACEMAKER IMPLANT N/A 12/13/2018   Procedure: PACEMAKER IMPLANT;  Surgeon: Marinus Maw, MD;  [facility]: [facility];  Service: Cardiovascular;  Laterality: N/A;   PACEMAKER INSERTION     POLYPECTOMY  10/02/2019   Procedure: POLYPECTOMY;  Surgeon: Corbin Ade, MD;  [facility];  Service: Endoscopy;;   POLYPECTOMY  11/24/2022   Procedure: POLYPECTOMY INTESTINAL;  Surgeon: Corbin Ade, MD;  [facility];  Service: Endoscopy;;   REFRACTIVE SURGERY  1994   Right lens implant       Family History  Problem Relation Age of Onset   COPD Mother 9   Heart disease Father    Cancer Father 2   Coronary artery disease Other    Diabetes Other    Obesity Other    Heart attack Brother 38   Coronary artery disease Sister    Hyperlipidemia Other    Hypertension Other    Colon cancer Neg Hx      Social History   Socioeconomic History   Marital status: Married    Spouse name: Not on file   Number of children: 0   Years of education: Not on file   Highest education level: Not on file  Occupational History   Occupation: Producer, 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[employer], 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  Tobacco Use   Smoking status: Never   Smokeless tobacco: Never  Vaping Use   Vaping Use: Never used  Substance and Sexual Activity   Alcohol use: No    Alcohol/week: 0.0 standard drinks of alcohol   Drug use: No   Sexual activity: Not on file  Other Topics Concern   Not on file  Social History Narrative   Not on file   Social Determinants of Health   Financial Resource Strain: Not on file  Food Insecurity: Not on file  Transportation Needs: Not on file  Physical Activity: Not on file  Stress: Not on file  Social Connections: Not on file  Intimate Partner Violence: Not on file     Ht 5\' 4"  (1.626 m)   Wt 193 lb (87.5 kg)   BMI 33.13 kg/m   Physical Exam:  Well appearing NAD HEENT: Unremarkable Neck:  No JVD, no thyromegally Lymphatics:  No adenopathy Back:  No CVA tenderness Lungs:  Clear HEART:  Regular rate rhythm, no murmurs, no rubs, no clicks Abd:  soft, positive bowel sounds, no organomegally, no rebound, no guarding Ext:  2 plus pulses, no edema, no cyanosis, no clubbing Skin:  No rashes no  nodules Neuro:  CN II through XII intact, motor grossly intact   DEVICE  Normal device function.  See PaceArt for details.   Assess/Plan:  CHB - she is conducting today and we have increased her AV delay on her device. She is now only pacing 1% of the time. PPM - her device is working normally. Obesity - she has lost another 30 lbs on Ozempic. Hopefully she can  lose more. HTN - she has reduced her dose of lisinopril. Today I asked her to decrease her dose further down to 2.5 mg daily.  I suspect that she will no longer require medications once her weight goes down more.   Sharlot Gowda Zorah Backes,MD

## 2023-04-20 DIAGNOSIS — M5033 Other cervical disc degeneration, cervicothoracic region: Secondary | ICD-10-CM | POA: Diagnosis not present

## 2023-04-20 DIAGNOSIS — M9901 Segmental and somatic dysfunction of cervical region: Secondary | ICD-10-CM | POA: Diagnosis not present

## 2023-05-04 DIAGNOSIS — M5033 Other cervical disc degeneration, cervicothoracic region: Secondary | ICD-10-CM | POA: Diagnosis not present

## 2023-05-04 DIAGNOSIS — H31091 Other chorioretinal scars, right eye: Secondary | ICD-10-CM | POA: Diagnosis not present

## 2023-05-04 DIAGNOSIS — E113393 Type 2 diabetes mellitus with moderate nonproliferative diabetic retinopathy without macular edema, bilateral: Secondary | ICD-10-CM | POA: Diagnosis not present

## 2023-05-04 DIAGNOSIS — H4051X2 Glaucoma secondary to other eye disorders, right eye, moderate stage: Secondary | ICD-10-CM | POA: Diagnosis not present

## 2023-05-04 DIAGNOSIS — H401122 Primary open-angle glaucoma, left eye, moderate stage: Secondary | ICD-10-CM | POA: Diagnosis not present

## 2023-05-04 DIAGNOSIS — M9901 Segmental and somatic dysfunction of cervical region: Secondary | ICD-10-CM | POA: Diagnosis not present

## 2023-05-11 DIAGNOSIS — M9901 Segmental and somatic dysfunction of cervical region: Secondary | ICD-10-CM | POA: Diagnosis not present

## 2023-05-11 DIAGNOSIS — M5033 Other cervical disc degeneration, cervicothoracic region: Secondary | ICD-10-CM | POA: Diagnosis not present

## 2023-05-17 DIAGNOSIS — M9901 Segmental and somatic dysfunction of cervical region: Secondary | ICD-10-CM | POA: Diagnosis not present

## 2023-05-17 DIAGNOSIS — M5033 Other cervical disc degeneration, cervicothoracic region: Secondary | ICD-10-CM | POA: Diagnosis not present

## 2023-05-24 DIAGNOSIS — I10 Essential (primary) hypertension: Secondary | ICD-10-CM | POA: Diagnosis not present

## 2023-05-24 DIAGNOSIS — M9901 Segmental and somatic dysfunction of cervical region: Secondary | ICD-10-CM | POA: Diagnosis not present

## 2023-05-24 DIAGNOSIS — R809 Proteinuria, unspecified: Secondary | ICD-10-CM | POA: Diagnosis not present

## 2023-05-24 DIAGNOSIS — E1165 Type 2 diabetes mellitus with hyperglycemia: Secondary | ICD-10-CM | POA: Diagnosis not present

## 2023-05-24 DIAGNOSIS — E039 Hypothyroidism, unspecified: Secondary | ICD-10-CM | POA: Diagnosis not present

## 2023-05-24 DIAGNOSIS — M5033 Other cervical disc degeneration, cervicothoracic region: Secondary | ICD-10-CM | POA: Diagnosis not present

## 2023-06-01 DIAGNOSIS — M5033 Other cervical disc degeneration, cervicothoracic region: Secondary | ICD-10-CM | POA: Diagnosis not present

## 2023-06-01 DIAGNOSIS — M9901 Segmental and somatic dysfunction of cervical region: Secondary | ICD-10-CM | POA: Diagnosis not present

## 2023-06-14 ENCOUNTER — Other Ambulatory Visit: Payer: Self-pay | Admitting: *Deleted

## 2023-06-14 MED ORDER — LISINOPRIL 2.5 MG PO TABS: 2.5000 mg | ORAL_TABLET | Freq: Every day | ORAL | 3 refills | Status: DC

## 2023-06-15 DIAGNOSIS — M5033 Other cervical disc degeneration, cervicothoracic region: Secondary | ICD-10-CM | POA: Diagnosis not present

## 2023-06-15 DIAGNOSIS — M9901 Segmental and somatic dysfunction of cervical region: Secondary | ICD-10-CM | POA: Diagnosis not present

## 2023-06-19 DIAGNOSIS — Z1283 Encounter for screening for malignant neoplasm of skin: Secondary | ICD-10-CM | POA: Diagnosis not present

## 2023-06-19 DIAGNOSIS — D225 Melanocytic nevi of trunk: Secondary | ICD-10-CM | POA: Diagnosis not present

## 2023-06-21 ENCOUNTER — Ambulatory Visit (INDEPENDENT_AMBULATORY_CARE_PROVIDER_SITE_OTHER): Payer: BC Managed Care – PPO

## 2023-06-21 DIAGNOSIS — I442 Atrioventricular block, complete: Secondary | ICD-10-CM

## 2023-06-21 LAB — CUP PACEART REMOTE DEVICE CHECK
Battery Remaining Longevity: 75 mo
Battery Remaining Percentage: 57 %
Battery Voltage: 2.99 V
Brady Statistic AP VP Percent: 1 %
Brady Statistic AP VS Percent: 1.9 %
Brady Statistic AS VP Percent: 1 %
Brady Statistic AS VS Percent: 98 %
Brady Statistic RA Percent Paced: 1.3 %
Brady Statistic RV Percent Paced: 1 %
Date Time Interrogation Session: 20240828020013
Implantable Lead Connection Status: 753985
Implantable Lead Connection Status: 753985
Implantable Lead Implant Date: 20200220
Implantable Lead Implant Date: 20200220
Implantable Lead Location: 753859
Implantable Lead Location: 753860
Implantable Pulse Generator Implant Date: 20200220
Lead Channel Impedance Value: 510 Ohm
Lead Channel Impedance Value: 530 Ohm
Lead Channel Pacing Threshold Amplitude: 0.5 V
Lead Channel Pacing Threshold Amplitude: 0.75 V
Lead Channel Pacing Threshold Pulse Width: 0.5 ms
Lead Channel Pacing Threshold Pulse Width: 0.5 ms
Lead Channel Sensing Intrinsic Amplitude: 2.1 mV
Lead Channel Sensing Intrinsic Amplitude: 8.4 mV
Lead Channel Setting Pacing Amplitude: 1 V
Lead Channel Setting Pacing Amplitude: 1.5 V
Lead Channel Setting Pacing Pulse Width: 0.5 ms
Lead Channel Setting Sensing Sensitivity: 3 mV
Pulse Gen Model: 2272
Pulse Gen Serial Number: 9107223

## 2023-06-22 DIAGNOSIS — M9901 Segmental and somatic dysfunction of cervical region: Secondary | ICD-10-CM | POA: Diagnosis not present

## 2023-06-22 DIAGNOSIS — M5033 Other cervical disc degeneration, cervicothoracic region: Secondary | ICD-10-CM | POA: Diagnosis not present

## 2023-06-29 DIAGNOSIS — M9901 Segmental and somatic dysfunction of cervical region: Secondary | ICD-10-CM | POA: Diagnosis not present

## 2023-06-29 DIAGNOSIS — M5033 Other cervical disc degeneration, cervicothoracic region: Secondary | ICD-10-CM | POA: Diagnosis not present

## 2023-06-30 NOTE — Progress Notes (Signed)
Remote pacemaker transmission.   

## 2023-07-06 DIAGNOSIS — M5033 Other cervical disc degeneration, cervicothoracic region: Secondary | ICD-10-CM | POA: Diagnosis not present

## 2023-07-06 DIAGNOSIS — M9901 Segmental and somatic dysfunction of cervical region: Secondary | ICD-10-CM | POA: Diagnosis not present

## 2023-07-13 DIAGNOSIS — M5033 Other cervical disc degeneration, cervicothoracic region: Secondary | ICD-10-CM | POA: Diagnosis not present

## 2023-07-13 DIAGNOSIS — M9901 Segmental and somatic dysfunction of cervical region: Secondary | ICD-10-CM | POA: Diagnosis not present

## 2023-08-08 DIAGNOSIS — M5033 Other cervical disc degeneration, cervicothoracic region: Secondary | ICD-10-CM | POA: Diagnosis not present

## 2023-08-08 DIAGNOSIS — M9901 Segmental and somatic dysfunction of cervical region: Secondary | ICD-10-CM | POA: Diagnosis not present

## 2023-08-22 DIAGNOSIS — M9901 Segmental and somatic dysfunction of cervical region: Secondary | ICD-10-CM | POA: Diagnosis not present

## 2023-08-22 DIAGNOSIS — M5033 Other cervical disc degeneration, cervicothoracic region: Secondary | ICD-10-CM | POA: Diagnosis not present

## 2023-09-06 DIAGNOSIS — Z0001 Encounter for general adult medical examination with abnormal findings: Secondary | ICD-10-CM | POA: Diagnosis not present

## 2023-09-06 DIAGNOSIS — I10 Essential (primary) hypertension: Secondary | ICD-10-CM | POA: Diagnosis not present

## 2023-09-06 DIAGNOSIS — Z6831 Body mass index (BMI) 31.0-31.9, adult: Secondary | ICD-10-CM | POA: Diagnosis not present

## 2023-09-06 DIAGNOSIS — E118 Type 2 diabetes mellitus with unspecified complications: Secondary | ICD-10-CM | POA: Diagnosis not present

## 2023-09-06 DIAGNOSIS — Z23 Encounter for immunization: Secondary | ICD-10-CM | POA: Diagnosis not present

## 2023-09-06 DIAGNOSIS — E6609 Other obesity due to excess calories: Secondary | ICD-10-CM | POA: Diagnosis not present

## 2023-09-06 DIAGNOSIS — E785 Hyperlipidemia, unspecified: Secondary | ICD-10-CM | POA: Diagnosis not present

## 2023-09-06 DIAGNOSIS — Z1331 Encounter for screening for depression: Secondary | ICD-10-CM | POA: Diagnosis not present

## 2023-09-07 DIAGNOSIS — M9901 Segmental and somatic dysfunction of cervical region: Secondary | ICD-10-CM | POA: Diagnosis not present

## 2023-09-07 DIAGNOSIS — M5033 Other cervical disc degeneration, cervicothoracic region: Secondary | ICD-10-CM | POA: Diagnosis not present

## 2023-09-18 DIAGNOSIS — R809 Proteinuria, unspecified: Secondary | ICD-10-CM | POA: Diagnosis not present

## 2023-09-18 DIAGNOSIS — E039 Hypothyroidism, unspecified: Secondary | ICD-10-CM | POA: Diagnosis not present

## 2023-09-18 DIAGNOSIS — I10 Essential (primary) hypertension: Secondary | ICD-10-CM | POA: Diagnosis not present

## 2023-09-18 DIAGNOSIS — E11319 Type 2 diabetes mellitus with unspecified diabetic retinopathy without macular edema: Secondary | ICD-10-CM | POA: Diagnosis not present

## 2023-09-18 DIAGNOSIS — E1165 Type 2 diabetes mellitus with hyperglycemia: Secondary | ICD-10-CM | POA: Diagnosis not present

## 2023-09-19 DIAGNOSIS — M9901 Segmental and somatic dysfunction of cervical region: Secondary | ICD-10-CM | POA: Diagnosis not present

## 2023-09-19 DIAGNOSIS — M5033 Other cervical disc degeneration, cervicothoracic region: Secondary | ICD-10-CM | POA: Diagnosis not present

## 2023-09-20 ENCOUNTER — Ambulatory Visit (INDEPENDENT_AMBULATORY_CARE_PROVIDER_SITE_OTHER): Payer: BC Managed Care – PPO

## 2023-09-20 DIAGNOSIS — I442 Atrioventricular block, complete: Secondary | ICD-10-CM | POA: Diagnosis not present

## 2023-09-20 LAB — CUP PACEART REMOTE DEVICE CHECK
Battery Remaining Longevity: 73 mo
Battery Remaining Percentage: 55 %
Battery Voltage: 2.99 V
Brady Statistic AP VP Percent: 1 %
Brady Statistic AP VS Percent: 2.1 %
Brady Statistic AS VP Percent: 1 %
Brady Statistic AS VS Percent: 97 %
Brady Statistic RA Percent Paced: 1.6 %
Brady Statistic RV Percent Paced: 1 %
Date Time Interrogation Session: 20241127035956
Implantable Lead Connection Status: 753985
Implantable Lead Connection Status: 753985
Implantable Lead Implant Date: 20200220
Implantable Lead Implant Date: 20200220
Implantable Lead Location: 753859
Implantable Lead Location: 753860
Implantable Pulse Generator Implant Date: 20200220
Lead Channel Impedance Value: 530 Ohm
Lead Channel Impedance Value: 590 Ohm
Lead Channel Pacing Threshold Amplitude: 0.375 V
Lead Channel Pacing Threshold Amplitude: 0.75 V
Lead Channel Pacing Threshold Pulse Width: 0.5 ms
Lead Channel Pacing Threshold Pulse Width: 0.5 ms
Lead Channel Sensing Intrinsic Amplitude: 2 mV
Lead Channel Sensing Intrinsic Amplitude: 7.7 mV
Lead Channel Setting Pacing Amplitude: 1 V
Lead Channel Setting Pacing Amplitude: 1.375
Lead Channel Setting Pacing Pulse Width: 0.5 ms
Lead Channel Setting Sensing Sensitivity: 3 mV
Pulse Gen Model: 2272
Pulse Gen Serial Number: 9107223

## 2023-09-28 DIAGNOSIS — M5033 Other cervical disc degeneration, cervicothoracic region: Secondary | ICD-10-CM | POA: Diagnosis not present

## 2023-09-28 DIAGNOSIS — H40022 Open angle with borderline findings, high risk, left eye: Secondary | ICD-10-CM | POA: Diagnosis not present

## 2023-09-28 DIAGNOSIS — T85398A Other mechanical complication of other ocular prosthetic devices, implants and grafts, initial encounter: Secondary | ICD-10-CM | POA: Diagnosis not present

## 2023-09-28 DIAGNOSIS — H209 Unspecified iridocyclitis: Secondary | ICD-10-CM | POA: Diagnosis not present

## 2023-09-28 DIAGNOSIS — M9901 Segmental and somatic dysfunction of cervical region: Secondary | ICD-10-CM | POA: Diagnosis not present

## 2023-09-28 DIAGNOSIS — H4041X0 Glaucoma secondary to eye inflammation, right eye, stage unspecified: Secondary | ICD-10-CM | POA: Diagnosis not present

## 2023-10-23 ENCOUNTER — Other Ambulatory Visit (HOSPITAL_COMMUNITY): Payer: Self-pay

## 2023-10-24 ENCOUNTER — Other Ambulatory Visit: Payer: Self-pay | Admitting: Internal Medicine

## 2023-10-24 ENCOUNTER — Other Ambulatory Visit: Payer: Self-pay

## 2023-10-24 ENCOUNTER — Other Ambulatory Visit: Payer: Self-pay | Admitting: Cardiovascular Disease

## 2023-10-24 ENCOUNTER — Other Ambulatory Visit: Payer: Self-pay | Admitting: Cardiology

## 2023-10-24 ENCOUNTER — Other Ambulatory Visit (HOSPITAL_COMMUNITY): Payer: Self-pay

## 2023-10-24 DIAGNOSIS — M5033 Other cervical disc degeneration, cervicothoracic region: Secondary | ICD-10-CM | POA: Diagnosis not present

## 2023-10-24 DIAGNOSIS — M9901 Segmental and somatic dysfunction of cervical region: Secondary | ICD-10-CM | POA: Diagnosis not present

## 2023-10-24 MED ORDER — ATORVASTATIN CALCIUM 80 MG PO TABS
80.0000 mg | ORAL_TABLET | Freq: Every day | ORAL | 3 refills | Status: AC
  Filled 2023-10-24: qty 90, 90d supply, fill #0
  Filled 2024-04-15: qty 90, 90d supply, fill #1
  Filled 2024-07-13: qty 90, 90d supply, fill #2
  Filled 2024-10-06: qty 90, 90d supply, fill #3

## 2023-10-24 MED ORDER — MOUNJARO 12.5 MG/0.5ML ~~LOC~~ SOAJ
12.5000 mg | SUBCUTANEOUS | 1 refills | Status: DC
  Filled 2023-12-19: qty 2, 28d supply, fill #0
  Filled 2023-12-20 – 2024-03-15 (×2): qty 6, 84d supply, fill #0

## 2023-10-24 MED ORDER — FREESTYLE LIBRE 3 SENSOR MISC
11 refills | Status: AC
  Filled 2023-12-18: qty 6, 84d supply, fill #0
  Filled 2024-03-05: qty 6, 84d supply, fill #1
  Filled 2024-05-28: qty 6, 84d supply, fill #2

## 2023-10-24 MED ORDER — LISINOPRIL 2.5 MG PO TABS
2.5000 mg | ORAL_TABLET | Freq: Every day | ORAL | 1 refills | Status: DC
  Filled 2023-10-24: qty 90, 90d supply, fill #0
  Filled 2024-07-13: qty 90, 90d supply, fill #1

## 2023-10-24 MED ORDER — LATANOPROST 0.005 % OP SOLN
1.0000 [drp] | Freq: Every day | OPHTHALMIC | 5 refills | Status: DC
  Filled 2024-02-21: qty 5, 100d supply, fill #0
  Filled 2024-05-31: qty 5, 100d supply, fill #1
  Filled 2024-09-08: qty 2.5, 50d supply, fill #2

## 2023-10-24 MED ORDER — METFORMIN HCL ER 500 MG PO TB24
500.0000 mg | ORAL_TABLET | Freq: Two times a day (BID) | ORAL | 3 refills | Status: AC
  Filled 2023-10-24: qty 180, 90d supply, fill #0
  Filled 2024-01-17: qty 180, 90d supply, fill #1

## 2023-10-24 MED ORDER — INSULIN ISOPHANE HUMAN 100 UNIT/ML KWIKPEN
PEN_INJECTOR | SUBCUTANEOUS | 3 refills | Status: AC
  Filled 2024-01-12: qty 75, 94d supply, fill #0

## 2023-10-24 MED ORDER — ATORVASTATIN CALCIUM 80 MG PO TABS
80.0000 mg | ORAL_TABLET | Freq: Every day | ORAL | 3 refills | Status: AC
  Filled 2024-01-15: qty 90, 90d supply, fill #0

## 2023-10-24 MED ORDER — LISINOPRIL 2.5 MG PO TABS
2.5000 mg | ORAL_TABLET | Freq: Every day | ORAL | 3 refills | Status: AC
  Filled 2024-01-15: qty 90, 90d supply, fill #0
  Filled 2024-04-15: qty 90, 90d supply, fill #1

## 2023-10-24 MED ORDER — MOUNJARO 12.5 MG/0.5ML ~~LOC~~ SOAJ
12.5000 mg | SUBCUTANEOUS | 1 refills | Status: AC
  Filled 2023-10-24: qty 2, 28d supply, fill #0
  Filled 2023-12-19 – 2023-12-21 (×2): qty 2, 28d supply, fill #1
  Filled 2024-01-11: qty 2, 28d supply, fill #2

## 2023-10-24 MED ORDER — INSULIN LISPRO (1 UNIT DIAL) 100 UNIT/ML (KWIKPEN): PEN_INJECTOR | SUBCUTANEOUS | 4 refills | Status: AC

## 2023-10-24 MED ORDER — FUROSEMIDE 20 MG PO TABS
20.0000 mg | ORAL_TABLET | ORAL | 1 refills | Status: AC | PRN
  Filled 2023-10-24: qty 90, 90d supply, fill #0

## 2023-10-24 NOTE — Telephone Encounter (Signed)
Yes that's is fine she only takes it on an as needed basis

## 2023-10-26 ENCOUNTER — Other Ambulatory Visit (HOSPITAL_COMMUNITY): Payer: Self-pay

## 2023-10-27 ENCOUNTER — Other Ambulatory Visit (HOSPITAL_COMMUNITY): Payer: Self-pay

## 2023-10-27 MED ORDER — ATORVASTATIN CALCIUM 80 MG PO TABS: 80.0000 mg | ORAL_TABLET | Freq: Every day | ORAL | 3 refills | Status: AC

## 2023-10-27 MED ORDER — INSULIN LISPRO (1 UNIT DIAL) 100 UNIT/ML (KWIKPEN): PEN_INJECTOR | SUBCUTANEOUS | 4 refills | Status: AC

## 2023-10-27 MED ORDER — LISINOPRIL 2.5 MG PO TABS: 2.5000 mg | ORAL_TABLET | Freq: Every day | ORAL | 3 refills | Status: AC

## 2023-10-27 MED ORDER — INSULIN ISOPHANE HUMAN 100 UNIT/ML KWIKPEN: PEN_INJECTOR | SUBCUTANEOUS | 3 refills | Status: DC

## 2023-10-27 MED ORDER — LATANOPROST 0.005 % OP SOLN
1.0000 [drp] | Freq: Every day | OPHTHALMIC | 5 refills | Status: AC
  Filled 2023-11-20: qty 5, 100d supply, fill #0

## 2023-10-27 MED ORDER — FREESTYLE LIBRE 3 SENSOR MISC
11 refills | Status: AC
  Filled 2024-02-05: qty 6, 84d supply, fill #0

## 2023-10-27 MED ORDER — METFORMIN HCL ER 500 MG PO TB24
500.0000 mg | ORAL_TABLET | Freq: Two times a day (BID) | ORAL | 3 refills | Status: DC
  Filled 2024-04-16: qty 180, 90d supply, fill #0

## 2023-10-31 ENCOUNTER — Other Ambulatory Visit (HOSPITAL_COMMUNITY): Payer: Self-pay

## 2023-11-16 DIAGNOSIS — M5033 Other cervical disc degeneration, cervicothoracic region: Secondary | ICD-10-CM | POA: Diagnosis not present

## 2023-11-16 DIAGNOSIS — M9901 Segmental and somatic dysfunction of cervical region: Secondary | ICD-10-CM | POA: Diagnosis not present

## 2023-11-20 ENCOUNTER — Other Ambulatory Visit (HOSPITAL_COMMUNITY): Payer: Self-pay

## 2023-11-30 DIAGNOSIS — M9901 Segmental and somatic dysfunction of cervical region: Secondary | ICD-10-CM | POA: Diagnosis not present

## 2023-11-30 DIAGNOSIS — M5033 Other cervical disc degeneration, cervicothoracic region: Secondary | ICD-10-CM | POA: Diagnosis not present

## 2023-12-11 DIAGNOSIS — M5033 Other cervical disc degeneration, cervicothoracic region: Secondary | ICD-10-CM | POA: Diagnosis not present

## 2023-12-11 DIAGNOSIS — M9901 Segmental and somatic dysfunction of cervical region: Secondary | ICD-10-CM | POA: Diagnosis not present

## 2023-12-15 ENCOUNTER — Other Ambulatory Visit (HOSPITAL_COMMUNITY): Payer: Self-pay

## 2023-12-15 DIAGNOSIS — H4041X0 Glaucoma secondary to eye inflammation, right eye, stage unspecified: Secondary | ICD-10-CM | POA: Diagnosis not present

## 2023-12-15 DIAGNOSIS — T85398A Other mechanical complication of other ocular prosthetic devices, implants and grafts, initial encounter: Secondary | ICD-10-CM | POA: Diagnosis not present

## 2023-12-15 DIAGNOSIS — H209 Unspecified iridocyclitis: Secondary | ICD-10-CM | POA: Diagnosis not present

## 2023-12-15 DIAGNOSIS — H40022 Open angle with borderline findings, high risk, left eye: Secondary | ICD-10-CM | POA: Diagnosis not present

## 2023-12-15 MED ORDER — DORZOLAMIDE HCL-TIMOLOL MAL 2-0.5 % OP SOLN
1.0000 [drp] | Freq: Two times a day (BID) | OPHTHALMIC | 3 refills | Status: AC
Start: 2023-12-15 — End: ?
  Filled 2023-12-15 – 2023-12-18 (×3): qty 10, 100d supply, fill #0
  Filled 2024-03-21: qty 10, 100d supply, fill #1
  Filled 2024-06-29: qty 10, 100d supply, fill #2
  Filled 2024-10-07: qty 10, 100d supply, fill #3

## 2023-12-15 MED ORDER — BRIMONIDINE TARTRATE 0.2 % OP SOLN
1.0000 [drp] | Freq: Three times a day (TID) | OPHTHALMIC | 3 refills | Status: AC
  Filled 2023-12-15: qty 10, 67d supply, fill #0
  Filled 2023-12-18 (×2): qty 15, 100d supply, fill #0
  Filled 2024-03-21: qty 15, 100d supply, fill #1
  Filled 2024-06-29: qty 15, 100d supply, fill #2
  Filled 2024-10-07: qty 15, 100d supply, fill #3

## 2023-12-18 ENCOUNTER — Other Ambulatory Visit: Payer: Self-pay

## 2023-12-18 ENCOUNTER — Other Ambulatory Visit (HOSPITAL_COMMUNITY): Payer: Self-pay

## 2023-12-19 ENCOUNTER — Other Ambulatory Visit (HOSPITAL_COMMUNITY): Payer: Self-pay

## 2023-12-19 ENCOUNTER — Other Ambulatory Visit: Payer: Self-pay

## 2023-12-20 ENCOUNTER — Other Ambulatory Visit (HOSPITAL_COMMUNITY): Payer: Self-pay

## 2023-12-20 ENCOUNTER — Ambulatory Visit (INDEPENDENT_AMBULATORY_CARE_PROVIDER_SITE_OTHER): Payer: BC Managed Care – PPO

## 2023-12-20 ENCOUNTER — Other Ambulatory Visit: Payer: Self-pay

## 2023-12-20 DIAGNOSIS — I442 Atrioventricular block, complete: Secondary | ICD-10-CM

## 2023-12-21 LAB — CUP PACEART REMOTE DEVICE CHECK
Battery Remaining Longevity: 68 mo
Battery Remaining Percentage: 52 %
Battery Voltage: 2.99 V
Brady Statistic AP VP Percent: 1 %
Brady Statistic AP VS Percent: 2.2 %
Brady Statistic AS VP Percent: 1 %
Brady Statistic AS VS Percent: 97 %
Brady Statistic RA Percent Paced: 1.8 %
Brady Statistic RV Percent Paced: 1 %
Date Time Interrogation Session: 20250226020013
Implantable Lead Connection Status: 753985
Implantable Lead Connection Status: 753985
Implantable Lead Implant Date: 20200220
Implantable Lead Implant Date: 20200220
Implantable Lead Location: 753859
Implantable Lead Location: 753860
Implantable Pulse Generator Implant Date: 20200220
Lead Channel Impedance Value: 410 Ohm
Lead Channel Impedance Value: 560 Ohm
Lead Channel Pacing Threshold Amplitude: 0.375 V
Lead Channel Pacing Threshold Amplitude: 0.625 V
Lead Channel Pacing Threshold Pulse Width: 0.5 ms
Lead Channel Pacing Threshold Pulse Width: 0.5 ms
Lead Channel Sensing Intrinsic Amplitude: 1.5 mV
Lead Channel Sensing Intrinsic Amplitude: 8.2 mV
Lead Channel Setting Pacing Amplitude: 0.875
Lead Channel Setting Pacing Amplitude: 1.375
Lead Channel Setting Pacing Pulse Width: 0.5 ms
Lead Channel Setting Sensing Sensitivity: 3 mV
Pulse Gen Model: 2272
Pulse Gen Serial Number: 9107223

## 2023-12-24 ENCOUNTER — Encounter: Payer: Self-pay | Admitting: Internal Medicine

## 2023-12-27 DIAGNOSIS — E1165 Type 2 diabetes mellitus with hyperglycemia: Secondary | ICD-10-CM | POA: Diagnosis not present

## 2023-12-27 DIAGNOSIS — E7800 Pure hypercholesterolemia, unspecified: Secondary | ICD-10-CM | POA: Diagnosis not present

## 2023-12-27 DIAGNOSIS — R809 Proteinuria, unspecified: Secondary | ICD-10-CM | POA: Diagnosis not present

## 2023-12-27 DIAGNOSIS — I10 Essential (primary) hypertension: Secondary | ICD-10-CM | POA: Diagnosis not present

## 2023-12-27 DIAGNOSIS — E039 Hypothyroidism, unspecified: Secondary | ICD-10-CM | POA: Diagnosis not present

## 2023-12-27 DIAGNOSIS — E11319 Type 2 diabetes mellitus with unspecified diabetic retinopathy without macular edema: Secondary | ICD-10-CM | POA: Diagnosis not present

## 2024-01-03 DIAGNOSIS — H3582 Retinal ischemia: Secondary | ICD-10-CM | POA: Diagnosis not present

## 2024-01-03 DIAGNOSIS — Z961 Presence of intraocular lens: Secondary | ICD-10-CM | POA: Diagnosis not present

## 2024-01-03 DIAGNOSIS — H4051X2 Glaucoma secondary to other eye disorders, right eye, moderate stage: Secondary | ICD-10-CM | POA: Diagnosis not present

## 2024-01-03 DIAGNOSIS — E113313 Type 2 diabetes mellitus with moderate nonproliferative diabetic retinopathy with macular edema, bilateral: Secondary | ICD-10-CM | POA: Diagnosis not present

## 2024-01-03 DIAGNOSIS — H35721 Serous detachment of retinal pigment epithelium, right eye: Secondary | ICD-10-CM | POA: Diagnosis not present

## 2024-01-03 DIAGNOSIS — H401122 Primary open-angle glaucoma, left eye, moderate stage: Secondary | ICD-10-CM | POA: Diagnosis not present

## 2024-01-12 ENCOUNTER — Other Ambulatory Visit (HOSPITAL_COMMUNITY): Payer: Self-pay

## 2024-01-12 ENCOUNTER — Other Ambulatory Visit: Payer: Self-pay

## 2024-01-15 ENCOUNTER — Other Ambulatory Visit: Payer: Self-pay

## 2024-01-15 ENCOUNTER — Other Ambulatory Visit (HOSPITAL_COMMUNITY): Payer: Self-pay

## 2024-01-16 ENCOUNTER — Other Ambulatory Visit: Payer: Self-pay

## 2024-01-22 ENCOUNTER — Other Ambulatory Visit (HOSPITAL_COMMUNITY): Payer: Self-pay

## 2024-01-23 NOTE — Addendum Note (Signed)
 Addended by: Elease Etienne A on: 01/23/2024 01:17 PM   Modules accepted: Orders

## 2024-01-23 NOTE — Progress Notes (Signed)
 Remote pacemaker transmission.

## 2024-01-24 DIAGNOSIS — Z1231 Encounter for screening mammogram for malignant neoplasm of breast: Secondary | ICD-10-CM | POA: Diagnosis not present

## 2024-01-24 DIAGNOSIS — Z01419 Encounter for gynecological examination (general) (routine) without abnormal findings: Secondary | ICD-10-CM | POA: Diagnosis not present

## 2024-02-05 ENCOUNTER — Other Ambulatory Visit (HOSPITAL_COMMUNITY): Payer: Self-pay

## 2024-02-14 ENCOUNTER — Other Ambulatory Visit: Payer: Self-pay

## 2024-02-14 ENCOUNTER — Other Ambulatory Visit (HOSPITAL_COMMUNITY): Payer: Self-pay

## 2024-02-14 MED ORDER — LEVOTHYROXINE SODIUM 112 MCG PO TABS
112.0000 ug | ORAL_TABLET | Freq: Every morning | ORAL | 3 refills | Status: DC
  Filled 2024-02-14: qty 30, 30d supply, fill #0
  Filled 2024-03-09: qty 30, 30d supply, fill #1
  Filled 2024-04-08: qty 30, 30d supply, fill #2

## 2024-02-15 DIAGNOSIS — H4041X0 Glaucoma secondary to eye inflammation, right eye, stage unspecified: Secondary | ICD-10-CM | POA: Diagnosis not present

## 2024-02-15 DIAGNOSIS — E113213 Type 2 diabetes mellitus with mild nonproliferative diabetic retinopathy with macular edema, bilateral: Secondary | ICD-10-CM | POA: Diagnosis not present

## 2024-02-15 DIAGNOSIS — H209 Unspecified iridocyclitis: Secondary | ICD-10-CM | POA: Diagnosis not present

## 2024-02-15 DIAGNOSIS — Z794 Long term (current) use of insulin: Secondary | ICD-10-CM | POA: Diagnosis not present

## 2024-02-15 DIAGNOSIS — T85398A Other mechanical complication of other ocular prosthetic devices, implants and grafts, initial encounter: Secondary | ICD-10-CM | POA: Diagnosis not present

## 2024-02-15 DIAGNOSIS — H40022 Open angle with borderline findings, high risk, left eye: Secondary | ICD-10-CM | POA: Diagnosis not present

## 2024-02-21 ENCOUNTER — Other Ambulatory Visit (HOSPITAL_COMMUNITY): Payer: Self-pay

## 2024-02-21 ENCOUNTER — Other Ambulatory Visit: Payer: Self-pay

## 2024-03-14 ENCOUNTER — Other Ambulatory Visit: Payer: Self-pay

## 2024-03-15 ENCOUNTER — Other Ambulatory Visit (HOSPITAL_COMMUNITY): Payer: Self-pay

## 2024-03-19 ENCOUNTER — Other Ambulatory Visit: Payer: Self-pay

## 2024-03-20 ENCOUNTER — Ambulatory Visit (INDEPENDENT_AMBULATORY_CARE_PROVIDER_SITE_OTHER): Payer: BC Managed Care – PPO

## 2024-03-20 DIAGNOSIS — I442 Atrioventricular block, complete: Secondary | ICD-10-CM

## 2024-03-21 ENCOUNTER — Other Ambulatory Visit: Payer: Self-pay

## 2024-03-21 LAB — CUP PACEART REMOTE DEVICE CHECK
Battery Remaining Longevity: 66 mo
Battery Remaining Percentage: 50 %
Battery Voltage: 2.99 V
Brady Statistic AP VP Percent: 1 %
Brady Statistic AP VS Percent: 2.4 %
Brady Statistic AS VP Percent: 1 %
Brady Statistic AS VS Percent: 97 %
Brady Statistic RA Percent Paced: 1.9 %
Brady Statistic RV Percent Paced: 1 %
Date Time Interrogation Session: 20250528020013
Implantable Lead Connection Status: 753985
Implantable Lead Connection Status: 753985
Implantable Lead Implant Date: 20200220
Implantable Lead Implant Date: 20200220
Implantable Lead Location: 753859
Implantable Lead Location: 753860
Implantable Pulse Generator Implant Date: 20200220
Lead Channel Impedance Value: 450 Ohm
Lead Channel Impedance Value: 560 Ohm
Lead Channel Pacing Threshold Amplitude: 0.5 V
Lead Channel Pacing Threshold Amplitude: 0.75 V
Lead Channel Pacing Threshold Pulse Width: 0.5 ms
Lead Channel Pacing Threshold Pulse Width: 0.5 ms
Lead Channel Sensing Intrinsic Amplitude: 2.5 mV
Lead Channel Sensing Intrinsic Amplitude: 8.7 mV
Lead Channel Setting Pacing Amplitude: 1 V
Lead Channel Setting Pacing Amplitude: 1.5 V
Lead Channel Setting Pacing Pulse Width: 0.5 ms
Lead Channel Setting Sensing Sensitivity: 3 mV
Pulse Gen Model: 2272
Pulse Gen Serial Number: 9107223

## 2024-03-28 ENCOUNTER — Ambulatory Visit: Payer: Self-pay | Admitting: Internal Medicine

## 2024-04-12 ENCOUNTER — Other Ambulatory Visit: Payer: Self-pay

## 2024-04-12 ENCOUNTER — Other Ambulatory Visit (HOSPITAL_COMMUNITY): Payer: Self-pay

## 2024-04-12 MED ORDER — HUMULIN N KWIKPEN 100 UNIT/ML ~~LOC~~ SUPN
PEN_INJECTOR | SUBCUTANEOUS | 3 refills | Status: AC
  Filled 2024-04-12: qty 75, 90d supply, fill #0

## 2024-04-15 ENCOUNTER — Other Ambulatory Visit: Payer: Self-pay

## 2024-04-15 ENCOUNTER — Encounter: Payer: Self-pay | Admitting: Pharmacist

## 2024-04-16 ENCOUNTER — Other Ambulatory Visit: Payer: Self-pay

## 2024-04-16 ENCOUNTER — Other Ambulatory Visit (HOSPITAL_COMMUNITY): Payer: Self-pay

## 2024-04-22 DIAGNOSIS — E7800 Pure hypercholesterolemia, unspecified: Secondary | ICD-10-CM | POA: Diagnosis not present

## 2024-04-22 DIAGNOSIS — E11319 Type 2 diabetes mellitus with unspecified diabetic retinopathy without macular edema: Secondary | ICD-10-CM | POA: Diagnosis not present

## 2024-04-22 DIAGNOSIS — E1165 Type 2 diabetes mellitus with hyperglycemia: Secondary | ICD-10-CM | POA: Diagnosis not present

## 2024-04-22 DIAGNOSIS — R809 Proteinuria, unspecified: Secondary | ICD-10-CM | POA: Diagnosis not present

## 2024-04-22 DIAGNOSIS — E039 Hypothyroidism, unspecified: Secondary | ICD-10-CM | POA: Diagnosis not present

## 2024-04-22 DIAGNOSIS — I10 Essential (primary) hypertension: Secondary | ICD-10-CM | POA: Diagnosis not present

## 2024-04-29 ENCOUNTER — Other Ambulatory Visit (HOSPITAL_COMMUNITY): Payer: Self-pay

## 2024-04-29 ENCOUNTER — Other Ambulatory Visit: Payer: Self-pay

## 2024-04-29 DIAGNOSIS — E1165 Type 2 diabetes mellitus with hyperglycemia: Secondary | ICD-10-CM | POA: Diagnosis not present

## 2024-04-29 DIAGNOSIS — E039 Hypothyroidism, unspecified: Secondary | ICD-10-CM | POA: Diagnosis not present

## 2024-04-29 DIAGNOSIS — E11319 Type 2 diabetes mellitus with unspecified diabetic retinopathy without macular edema: Secondary | ICD-10-CM | POA: Diagnosis not present

## 2024-04-29 DIAGNOSIS — E7800 Pure hypercholesterolemia, unspecified: Secondary | ICD-10-CM | POA: Diagnosis not present

## 2024-04-29 DIAGNOSIS — I10 Essential (primary) hypertension: Secondary | ICD-10-CM | POA: Diagnosis not present

## 2024-04-29 DIAGNOSIS — R809 Proteinuria, unspecified: Secondary | ICD-10-CM | POA: Diagnosis not present

## 2024-04-29 MED ORDER — FREESTYLE LIBRE 3 PLUS SENSOR MISC
5 refills | Status: AC
  Filled 2024-04-29 – 2024-04-30 (×2): qty 6, 90d supply, fill #0

## 2024-04-29 MED ORDER — LEVOTHYROXINE SODIUM 100 MCG PO TABS
100.0000 ug | ORAL_TABLET | Freq: Every morning | ORAL | 1 refills | Status: DC
  Filled 2024-04-29: qty 90, 90d supply, fill #0
  Filled 2024-07-22: qty 90, 90d supply, fill #1

## 2024-04-30 ENCOUNTER — Other Ambulatory Visit: Payer: Self-pay

## 2024-05-01 ENCOUNTER — Encounter: Payer: Self-pay | Admitting: Internal Medicine

## 2024-05-01 ENCOUNTER — Ambulatory Visit: Attending: Internal Medicine | Admitting: Internal Medicine

## 2024-05-01 VITALS — BP 110/62 | HR 93 | Ht 64.0 in | Wt 172.6 lb

## 2024-05-01 DIAGNOSIS — I442 Atrioventricular block, complete: Secondary | ICD-10-CM

## 2024-05-01 LAB — CUP PACEART INCLINIC DEVICE CHECK
Brady Statistic RA Percent Paced: 1.8 %
Brady Statistic RV Percent Paced: 1 % — CL
Date Time Interrogation Session: 20250709110626
Implantable Lead Connection Status: 753985
Implantable Lead Connection Status: 753985
Implantable Lead Implant Date: 20200220
Implantable Lead Implant Date: 20200220
Implantable Lead Location: 753859
Implantable Lead Location: 753860
Implantable Pulse Generator Implant Date: 20200220
Pulse Gen Model: 2272
Pulse Gen Serial Number: 9107223

## 2024-05-01 NOTE — Progress Notes (Signed)
 HPI Jordan Perry returns today for followup of CHB, s/p PPM insertion. She has a h/o morbid obesity and has done well since her PPM. She has undergone dental implants. She denies chest pain or sob. She had lost weight, over 60 lbs. And almost 30 pounds in just the last year. She denies edema or sob .  Her dose of lisinopril  and her insulin  have been decreased. She feels well.  Allergies  Allergen Reactions   Invokana [Canagliflozin] Other (See Comments)    Skin peeling   Janumet [Sitagliptin Phos-Metformin  Hcl] Other (See Comments)    Skin peeling   Cephalexin Other (See Comments)   Penicillins     Has patient had a PCN reaction causing immediate rash, facial/tongue/throat swelling, SOB or lightheadedness with hypotension: Yes-immediate rash Has patient had a PCN reaction causing severe rash involving mucus membranes or skin necrosis: Yes Has patient had a PCN reaction that required hospitalization: No Has patient had a PCN reaction occurring within the last 10 years: No If all of the above answers are NO, then may proceed with Cephalosporin use.    Prednisone Rash     Current Outpatient Medications  Medication Sig Dispense Refill   aspirin  EC 81 MG tablet Take 81 mg by mouth daily.     atorvastatin  (LIPITOR) 80 MG tablet Take 1 tablet (80 mg total) by mouth at bedtime. 90 tablet 3   atorvastatin  (LIPITOR) 80 MG tablet Take 1 tablet (80 mg total) by mouth at bedtime. 90 tablet 3   atorvastatin  (LIPITOR) 80 MG tablet Take 1 tablet (80 mg total) by mouth at bedtime. 90 tablet 3   brimonidine  (ALPHAGAN ) 0.2 % ophthalmic solution Place 1 drop into the right eye 2 (two) times daily.     brimonidine  (ALPHAGAN ) 0.2 % ophthalmic solution Place 1 drop into the right eye 3 (three) times daily. 30 mL 3   Continuous Glucose Sensor (FREESTYLE LIBRE 3 PLUS SENSOR) MISC Apply 1 sensor and change every 15 days. 6 each 5   Continuous Glucose Sensor (FREESTYLE LIBRE 3 SENSOR) MISC Change every  14 days as directed 2 each 11   Continuous Glucose Sensor (FREESTYLE LIBRE 3 SENSOR) MISC Change every 14 (fourteen) days. 6 each 11   dorzolamide -timolol  (COSOPT ) 2-0.5 % ophthalmic solution Place 1 drop into the right eye 2 (two) times daily.     dorzolamide -timolol  (COSOPT ) 2-0.5 % ophthalmic solution Place 1 drop into the right eye 2 (two) times daily. 30 mL 3   furosemide  (LASIX ) 20 MG tablet Take 1 tablet (20 mg total) by mouth as needed. 90 tablet 1   HUMULIN  N KWIKPEN 100 UNIT/ML Kiwkpen Inject 20-30 Units into the skin in the morning and at bedtime.     insulin  lispro (HUMALOG ) 100 UNIT/ML KwikPen Inject 15-25 Units into the skin 3 (three) times daily.     insulin  lispro (HUMALOG ) 100 UNIT/ML KwikPen Inject 20 Units into the skin in the morning AND 20 Units daily at 12 noon AND 30 Units 2 (two) times daily at 8 am and 4 pm. 60 mL 4   insulin  lispro (HUMALOG ) 100 UNIT/ML KwikPen Inject three times daily - 20 Units into the skin in the morning AND 20 Units daily with lunch AND 30 Units daily with supper as directed. 60 mL 4   Insulin  NPH, Human,, Isophane, (HUMULIN  N KWIKPEN) 100 UNIT/ML Kiwkpen Inject 60 Units into the skin every morning and 20 Units into the skin every evening. 75 mL 3  Insulin  NPH, Human,, Isophane, (HUMULIN  N) 100 UNIT/ML Kiwkpen Inject 60 Units into the skin in the morning AND 20 Units every evening. 75 mL 3   latanoprost  (XALATAN ) 0.005 % ophthalmic solution Place 1 drop into the right eye at bedtime.     latanoprost  (XALATAN ) 0.005 % ophthalmic solution Place 1 drop into the right eye at bedtime. 5 mL 5   latanoprost  (XALATAN ) 0.005 % ophthalmic solution Place 1 drop into the right eye at bedtime. 5 mL 5   levothyroxine  (SYNTHROID ) 100 MCG tablet Take 1 tablet (100 mcg total) by mouth in the morning on an empty stomach. 90 tablet 1   lisinopril  (ZESTRIL ) 2.5 MG tablet Take 1 tablet (2.5 mg total) by mouth daily. 90 tablet 1   lisinopril  (ZESTRIL ) 2.5 MG tablet Take 1  tablet (2.5 mg total) by mouth daily. 90 tablet 3   lisinopril  (ZESTRIL ) 2.5 MG tablet Take 1 tablet (2.5 mg total) by mouth daily. 90 tablet 3   metFORMIN  (GLUCOPHAGE -XR) 500 MG 24 hr tablet Take 500 mg by mouth 2 (two) times daily after a meal.      metFORMIN  (GLUCOPHAGE -XR) 500 MG 24 hr tablet Take 1 tablet (500 mg total) by mouth 2 (two) times daily. 180 tablet 3   metFORMIN  (GLUCOPHAGE -XR) 500 MG 24 hr tablet Take 1 tablet (500 mg total) by mouth 2 (two) times daily. 180 tablet 3   Multiple Vitamins-Minerals (CENTRUM SILVER 50+WOMEN) TABS Take 1 tablet by mouth daily.     Polyethyl Glycol-Propyl Glycol (SYSTANE OP) Place 1 drop into both eyes as needed (dry eyes).     Probiotic Product (ALIGN) 4 MG CAPS Take 1 capsule by mouth daily.     SYNTHROID  112 MCG tablet Take 112 mcg by mouth daily.     tirzepatide  (MOUNJARO ) 12.5 MG/0.5ML Pen Inject 12.5 mg into the skin every 7 (seven) days. 6 mL 1   MOUNJARO  7.5 MG/0.5ML Pen Inject 7.5 mg into the skin once a week.     tirzepatide  (MOUNJARO ) 12.5 MG/0.5ML Pen Inject 12.5 mg into the skin once a week. 6 mL 1   No current facility-administered medications for this visit.     Past Medical History:  Diagnosis Date   Allergic rhinitis, cause unspecified    Arthritis    Bradycardia    Coronary atherosclerosis of unspecified type of vessel, native or graft    Dysrhythmia    GERD (gastroesophageal reflux disease)    Hypothyroidism    Presence of permanent cardiac pacemaker    Proteinuria    Pure hyperglyceridemia    Type II or unspecified type diabetes mellitus without mention of complication, not stated as uncontrolled    Unspecified essential hypertension     ROS:   All systems reviewed and negative except as noted in the HPI.   Past Surgical History:  Procedure Laterality Date   COLONOSCOPY N/A 10/02/2019   Procedure: COLONOSCOPY;  Surgeon: Shaaron Lamar HERO, MD;  Location: [facility];  Service: Endoscopy;  Laterality: N/A;  9:30    COLONOSCOPY WITH PROPOFOL  N/A 11/24/2022   Procedure: COLONOSCOPY WITH PROPOFOL ;  Surgeon: Shaaron Lamar HERO, MD;  Location: [facility];  Service: Endoscopy;  Laterality: N/A;  830am, asa 3   CYSTECTOMY  1989   removed from end of spine   PACEMAKER IMPLANT N/A 12/13/2018   Procedure: PACEMAKER IMPLANT;  Surgeon: Waddell Jordan ORN, MD;  Location: [facility];  Service: Cardiovascular;  Laterality: N/A;   PACEMAKER INSERTION  POLYPECTOMY  10/02/2019   Procedure: POLYPECTOMY;  Surgeon: Shaaron Lamar HERO, MD;  Location: [facility];  Service: Endoscopy;;   POLYPECTOMY  11/24/2022   Procedure: POLYPECTOMY INTESTINAL;  Surgeon: Shaaron Lamar HERO, MD;  Location: [facility];  Service: Endoscopy;;   REFRACTIVE SURGERY  1994   Right lens implant       Family History  Problem Relation Age of Onset   COPD Mother 21   Heart disease Father    Cancer Father 23   Coronary artery disease Other    Diabetes Other    Obesity Other    Heart attack Brother 61   Coronary artery disease Sister    Hyperlipidemia Other    Hypertension Other    Colon cancer Neg Hx      Social History   Socioeconomic History   Marital status: Married    Spouse name: Not on file   Number of children: 0   Years of education: Not on file   Highest education level: Not on file  Occupational History   Occupation: Producer, 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[place]    Comment: [employer], 13+ years  Tobacco Use   Smoking status: Never   Smokeless tobacco: Never  Vaping Use   Vaping status: Never Used  Substance and Sexual Activity   Alcohol use: No    Alcohol/week: 0.0 standard drinks of alcohol   Drug use: No   Sexual activity: Not on file  Other Topics Concern   Not on file  Social History Narrative   Not on file   Social Drivers of Health   Financial Resource Strain: Not on file  Food Insecurity: Not on file  Transportation Needs: Not on file  Physical Activity: Not on file  Stress: Not on file  Social  Connections: Unknown (03/05/2022)   [facility]   Social Network    Social Network: Not on file  Intimate Partner Violence: Unknown (01/26/2022)   Received from [facility]   HITS    Physically Hurt: Not on file    Insult or Talk Down To: Not on file    Threaten Physical Harm: Not on file    Scream or Curse: Not on file     BP 110/62   Pulse 93   Ht 5' 4 (1.626 m)   Wt 172 lb 9.6 oz (78.3 kg)   SpO2 96%   BMI 29.63 kg/m   Physical Exam:  Well appearing 68 yo woman, NAD HEENT: Unremarkable Neck:  No JVD, no thyromegally Lymphatics:  No adenopathy Back:  No CVA tenderness Lungs:  Clear with no wheezes HEART:  Regular rate rhythm, no murmurs, no rubs, no clicks Abd:  soft, positive bowel sounds, no organomegally, no rebound, no guarding Ext:  2 plus pulses, no edema, no cyanosis, no clubbing Skin:  No rashes no nodules Neuro:  CN II through XII intact, motor grossly intact  EKG - nsr; since prior tracing ventricular pacing is no longer present  DEVICE  Normal device function.  See PaceArt for details.   Assess/Plan:  CHB - she is conducting today and we have increased her AV delay on her device. She is now only pacing 1% of the time. PPM - her device is working normally. Obesity - she has lost another 30 lbs on Ozempic. Hopefully she can lose more. HTN - she has reduced her dose of lisinopril . Today I asked her to decrease her dose further down to 2.5 mg daily.  I suspect that she will no longer require medications once her weight goes down more.   Jordan Alvetta Hidrogo,MD

## 2024-05-01 NOTE — Patient Instructions (Signed)

## 2024-05-13 NOTE — Addendum Note (Signed)
 Addended by: VICCI SELLER A on: 05/13/2024 02:01 PM   Modules accepted: Orders

## 2024-05-13 NOTE — Progress Notes (Signed)
 Remote pacemaker transmission.

## 2024-05-14 ENCOUNTER — Emergency Department (HOSPITAL_COMMUNITY)
Admission: EM | Admit: 2024-05-14 | Discharge: 2024-05-14 | Disposition: A | Discharge status: Home / Self Care | Attending: Emergency Medicine | Admitting: Emergency Medicine

## 2024-05-14 ENCOUNTER — Emergency Department (HOSPITAL_COMMUNITY)

## 2024-05-14 ENCOUNTER — Other Ambulatory Visit: Payer: Self-pay

## 2024-05-14 ENCOUNTER — Encounter (HOSPITAL_COMMUNITY): Payer: Self-pay | Admitting: *Deleted

## 2024-05-14 DIAGNOSIS — S0083XA Contusion of other part of head, initial encounter: Secondary | ICD-10-CM | POA: Insufficient documentation

## 2024-05-14 DIAGNOSIS — Z950 Presence of cardiac pacemaker: Secondary | ICD-10-CM | POA: Insufficient documentation

## 2024-05-14 DIAGNOSIS — S80212A Abrasion, left knee, initial encounter: Secondary | ICD-10-CM | POA: Diagnosis not present

## 2024-05-14 DIAGNOSIS — S0512XA Contusion of eyeball and orbital tissues, left eye, initial encounter: Secondary | ICD-10-CM | POA: Diagnosis not present

## 2024-05-14 DIAGNOSIS — Z7984 Long term (current) use of oral hypoglycemic drugs: Secondary | ICD-10-CM | POA: Insufficient documentation

## 2024-05-14 DIAGNOSIS — I2510 Atherosclerotic heart disease of native coronary artery without angina pectoris: Secondary | ICD-10-CM | POA: Diagnosis not present

## 2024-05-14 DIAGNOSIS — S0232XA Fracture of orbital floor, left side, initial encounter for closed fracture: Secondary | ICD-10-CM | POA: Diagnosis not present

## 2024-05-14 DIAGNOSIS — Z23 Encounter for immunization: Secondary | ICD-10-CM | POA: Insufficient documentation

## 2024-05-14 DIAGNOSIS — Z7982 Long term (current) use of aspirin: Secondary | ICD-10-CM | POA: Diagnosis not present

## 2024-05-14 DIAGNOSIS — E119 Type 2 diabetes mellitus without complications: Secondary | ICD-10-CM | POA: Diagnosis not present

## 2024-05-14 DIAGNOSIS — R220 Localized swelling, mass and lump, head: Secondary | ICD-10-CM | POA: Diagnosis not present

## 2024-05-14 DIAGNOSIS — W01198A Fall on same level from slipping, tripping and stumbling with subsequent striking against other object, initial encounter: Secondary | ICD-10-CM | POA: Diagnosis not present

## 2024-05-14 DIAGNOSIS — I10 Essential (primary) hypertension: Secondary | ICD-10-CM | POA: Diagnosis not present

## 2024-05-14 DIAGNOSIS — Z79899 Other long term (current) drug therapy: Secondary | ICD-10-CM | POA: Diagnosis not present

## 2024-05-14 DIAGNOSIS — W19XXXA Unspecified fall, initial encounter: Secondary | ICD-10-CM

## 2024-05-14 DIAGNOSIS — Z794 Long term (current) use of insulin: Secondary | ICD-10-CM | POA: Diagnosis not present

## 2024-05-14 DIAGNOSIS — S0081XA Abrasion of other part of head, initial encounter: Secondary | ICD-10-CM | POA: Diagnosis present

## 2024-05-14 DIAGNOSIS — S0990XA Unspecified injury of head, initial encounter: Secondary | ICD-10-CM

## 2024-05-14 DIAGNOSIS — I6523 Occlusion and stenosis of bilateral carotid arteries: Secondary | ICD-10-CM | POA: Diagnosis not present

## 2024-05-14 MED ORDER — TETANUS-DIPHTH-ACELL PERTUSSIS 5-2.5-18.5 LF-MCG/0.5 IM SUSY
0.5000 mL | PREFILLED_SYRINGE | Freq: Once | INTRAMUSCULAR | Status: AC
  Administered 2024-05-14: 0.5 mL via INTRAMUSCULAR
  Filled 2024-05-14: qty 0.5

## 2024-05-14 MED ORDER — TRIPLE ANTIBIOTIC 3.5-400-5000 EX OINT
TOPICAL_OINTMENT | Freq: Once | CUTANEOUS | Status: AC
  Filled 2024-05-14: qty 1

## 2024-05-14 NOTE — ED Notes (Signed)
Ice pack applied to head

## 2024-05-14 NOTE — ED Triage Notes (Signed)
 Pt tripped over a cord and fell face forward striking her face on concrete.  Denies LOC, denies any blood thinners. Pt with swelling above left eye and abrasion below left eye. Pt fell on bilateral knees.

## 2024-05-14 NOTE — ED Provider Notes (Signed)
 [place] EMERGENCY DEPARTMENT AT [facility] Provider Note   CSN: [id number] Arrival date & time: 05/14/24  1755     Patient presents with: Jordan Perry is a 68 y.o. female with a history including type 2 diabetes, hypertension, CAD, GERD and history of complete heart block with pacemaker presenting for evaluation of a trip and fall which occurred just prior to arrival.  She was out doors making homemade ice cream when she tripped over an electrical cord and fell face forward onto concrete.  She has a small abrasion to her left knee in addition to abrasion and swelling at her left periorbital area.  She denies any other complaint of pain and has been ambulatory with full range of motion of the left knee without pain since the fall.  She denies nausea or vomiting, no LOC, no vision disturbance, she denies neck pain.  She does take a baby aspirin  daily, no other anticoagulants.  She has had no treatment prior to arrival.  She is out of date with her tetanus coverage.   The history is provided by the patient.       Prior to Admission medications   Medication Sig Start Date End Date Taking? Authorizing Provider  aspirin  EC 81 MG tablet Take 81 mg by mouth daily.    [provider]  atorvastatin  (LIPITOR) 80 MG tablet Take 1 tablet (80 mg total) by mouth at bedtime. Patient not taking: Reported on 05/01/2024 10/24/23   Alvan Dorn FALCON, MD  atorvastatin  (LIPITOR) 80 MG tablet Take 1 tablet (80 mg total) by mouth at bedtime. Patient not taking: Reported on 05/01/2024 03/15/23     atorvastatin  (LIPITOR) 80 MG tablet Take 1 tablet (80 mg total) by mouth at bedtime. 03/15/23     brimonidine  (ALPHAGAN ) 0.2 % ophthalmic solution Place 1 drop into the right eye 2 (two) times daily. Patient not taking: Reported on 05/01/2024 08/01/22   [provider]  brimonidine  (ALPHAGAN ) 0.2 % ophthalmic solution Place 1 drop into the right eye 3 (three) times daily. 12/15/23      Continuous Glucose Sensor (FREESTYLE LIBRE 3 PLUS SENSOR) MISC Apply 1 sensor and change every 15 days. Patient not taking: Reported on 05/01/2024 04/29/24   Tommas Pears, MD  Continuous Glucose Sensor (FREESTYLE LIBRE 3 SENSOR) MISC Change every 14 days as directed Patient not taking: Reported on 05/01/2024 06/14/23     Continuous Glucose Sensor (FREESTYLE LIBRE 3 SENSOR) MISC Change every 14 (fourteen) days. 06/14/23     dorzolamide -timolol  (COSOPT ) 2-0.5 % ophthalmic solution Place 1 drop into the right eye 2 (two) times daily. Patient not taking: Reported on 05/01/2024 11/15/22   [provider]  dorzolamide -timolol  (COSOPT ) 2-0.5 % ophthalmic solution Place 1 drop into the right eye 2 (two) times daily. 12/15/23     furosemide  (LASIX ) 20 MG tablet Take 1 tablet (20 mg total) by mouth as needed. 10/24/23   Alvan Dorn FALCON, MD  HUMULIN  N KWIKPEN 100 UNIT/ML Kiwkpen Inject 20-30 Units into the skin in the morning and at bedtime. 12/20/17   [provider]  insulin  lispro (HUMALOG ) 100 UNIT/ML KwikPen Inject 15-25 Units into the skin 3 (three) times daily.    [provider]  insulin  lispro (HUMALOG ) 100 UNIT/ML KwikPen Inject 20 Units into the skin in the morning AND 20 Units daily at 12 noon AND 30 Units 2 (two) times daily at 8 am and 4 pm. 12/19/22     insulin  lispro (  HUMALOG ) 100 UNIT/ML KwikPen Inject three times daily - 20 Units into the skin in the morning AND 20 Units daily with lunch AND 30 Units daily with supper as directed. 12/24/22     Insulin  NPH, Human,, Isophane, (HUMULIN  N KWIKPEN) 100 UNIT/ML Kiwkpen Inject 60 Units into the skin every morning and 20 Units into the skin every evening. 04/12/24 07/14/24    Insulin  NPH, Human,, Isophane, (HUMULIN  N) 100 UNIT/ML Kiwkpen Inject 60 Units into the skin in the morning AND 20 Units every evening. 04/12/23     latanoprost  (XALATAN ) 0.005 % ophthalmic solution Place 1 drop into the right eye at bedtime. Patient not taking:  Reported on 05/01/2024 09/01/22   [provider]  latanoprost  (XALATAN ) 0.005 % ophthalmic solution Place 1 drop into the right eye at bedtime. Patient not taking: Reported on 05/01/2024 09/28/23     latanoprost  (XALATAN ) 0.005 % ophthalmic solution Place 1 drop into the right eye at bedtime. 10/01/23     levothyroxine  (SYNTHROID ) 100 MCG tablet Take 1 tablet (100 mcg total) by mouth in the morning on an empty stomach. 04/29/24     lisinopril  (ZESTRIL ) 2.5 MG tablet Take 1 tablet (2.5 mg total) by mouth daily. Patient not taking: Reported on 05/01/2024 10/24/23 05/01/24  Waddell Danelle ORN, MD  lisinopril  (ZESTRIL ) 2.5 MG tablet Take 1 tablet (2.5 mg total) by mouth daily. Patient not taking: Reported on 05/01/2024 06/14/23     lisinopril  (ZESTRIL ) 2.5 MG tablet Take 1 tablet (2.5 mg total) by mouth daily. 06/17/23     metFORMIN  (GLUCOPHAGE -XR) 500 MG 24 hr tablet Take 500 mg by mouth 2 (two) times daily after a meal.  Patient not taking: Reported on 05/01/2024 09/07/13   [provider]  metFORMIN  (GLUCOPHAGE -XR) 500 MG 24 hr tablet Take 1 tablet (500 mg total) by mouth 2 (two) times daily. Patient not taking: Reported on 05/01/2024 05/29/23     metFORMIN  (GLUCOPHAGE -XR) 500 MG 24 hr tablet Take 1 tablet (500 mg total) by mouth 2 (two) times daily. 05/29/23     MOUNJARO  7.5 MG/0.5ML Pen Inject 7.5 mg into the skin once a week. 09/09/22   [provider]  Multiple Vitamins-Minerals (CENTRUM SILVER 50+WOMEN) TABS Take 1 tablet by mouth daily.    [provider]  Polyethyl Glycol-Propyl Glycol (SYSTANE OP) Place 1 drop into both eyes as needed (dry eyes).    [provider]  Probiotic Product (ALIGN) 4 MG CAPS Take 1 capsule by mouth daily.    [provider]  SYNTHROID  112 MCG tablet Take 112 mcg by mouth daily. Patient not taking: Reported on 05/01/2024 04/07/23   [provider]  tirzepatide  (MOUNJARO ) 12.5 MG/0.5ML Pen Inject 12.5 mg into the skin every 7  (seven) days. 09/26/23     tirzepatide  (MOUNJARO ) 12.5 MG/0.5ML Pen Inject 12.5 mg into the skin once a week. 09/26/23       Allergies: Invokana [canagliflozin], Janumet [sitagliptin phos-metformin  hcl], Cephalexin, Penicillins, and Prednisone    Review of Systems  Constitutional:  Negative for fever.  HENT:  Positive for facial swelling. Negative for congestion and sore throat.   Eyes: Negative.   Respiratory:  Negative for chest tightness and shortness of breath.   Cardiovascular:  Negative for chest pain.  Gastrointestinal:  Negative for abdominal pain and nausea.  Genitourinary: Negative.   Musculoskeletal:  Negative for arthralgias, back pain, joint swelling and neck pain.  Skin:  Positive for wound. Negative for rash.  Neurological:  Positive for headaches.  Negative for dizziness, syncope, weakness, light-headedness and numbness.  Psychiatric/Behavioral: Negative.      Updated Vital Signs BP 135/63 (BP Location: Right Arm)   Pulse 90   Temp 97.6 F (36.4 C)   Resp 20   Ht 5' 4 (1.626 m)   Wt 77.1 kg   SpO2 100%   BMI 29.18 kg/m   Physical Exam Vitals and nursing note reviewed.  Constitutional:      Appearance: She is well-developed.  HENT:     Head: Normocephalic.     Comments: Hematoma left mid forehead, significant abrasion at the left cheek.  These abrasions are hemostatic.  Ice has been applied to the hematoma. Eyes:     Extraocular Movements: Extraocular movements intact.     Conjunctiva/sclera: Conjunctivae normal.     Pupils: Pupils are equal, round, and reactive to light.     Comments: No eye pain with range of motion.  Globes are atraumatic.  Cardiovascular:     Rate and Rhythm: Normal rate.  Pulmonary:     Effort: Pulmonary effort is normal.     Breath sounds: Normal breath sounds.  Musculoskeletal:        General: No swelling, tenderness or deformity. Normal range of motion.     Cervical back: Normal range of motion. No tenderness.     Comments:  Superficial abrasion left patella.  She displays full range of motion of the knee without pain.  Skin:    General: Skin is warm and dry.     Comments: Facial abrasions, left knee abrasion  Neurological:     General: No focal deficit present.     Mental Status: She is alert and oriented to person, place, and time.     (all labs ordered are listed, but only abnormal results are displayed) Labs Reviewed - No data to display  EKG: None  Radiology: No results found.   Procedures   Medications Ordered in the ED  Tdap (BOOSTRIX ) injection 0.5 mL (has no administration in time range)  neomycin -bacitracin -polymyxin [phone] OINT (has no administration in time range)                                    Medical Decision Making Patient presenting with a trip and fall occurring just prior to arrival.  No LOC, no symptoms suggesting concussion at this time.  Significant left forehead hematoma and facial abrasion.  No globe injury noted.  CT imaging including C-spine has been ordered based on the nature of her fall, she is nontender midline C-spine.  Tetanus update has been ordered, wound care also ordered.  Dr. Dean assumes care - expected dispo home if negative CT results.  Amount and/or Complexity of Data Reviewed Radiology: ordered.    Details: Ct head,  c spine and maxillofacial imaging ordered.  Risk OTC drugs. Prescription drug management.        Final diagnoses:  Fall, initial encounter  Contusion of face, initial encounter  Injury of head, initial encounter    ED Discharge Orders     None          Jordan Perry 05/14/24 Jordan Dean Mliss, MD 05/14/24 [phone]

## 2024-05-17 ENCOUNTER — Ambulatory Visit (INDEPENDENT_AMBULATORY_CARE_PROVIDER_SITE_OTHER): Admitting: Physician Assistant

## 2024-05-17 ENCOUNTER — Encounter (INDEPENDENT_AMBULATORY_CARE_PROVIDER_SITE_OTHER): Payer: Self-pay | Admitting: Physician Assistant

## 2024-05-17 VITALS — BP 137/81 | HR 77

## 2024-05-17 DIAGNOSIS — J342 Deviated nasal septum: Secondary | ICD-10-CM

## 2024-05-17 DIAGNOSIS — S0232XA Fracture of orbital floor, left side, initial encounter for closed fracture: Secondary | ICD-10-CM | POA: Diagnosis not present

## 2024-05-17 DIAGNOSIS — W1830XA Fall on same level, unspecified, initial encounter: Secondary | ICD-10-CM

## 2024-05-17 MED ORDER — BACITRACIN 500 UNIT/GM EX OINT: 1.0000 | TOPICAL_OINTMENT | Freq: Two times a day (BID) | CUTANEOUS | 0 refills | Status: AC

## 2024-05-17 NOTE — Progress Notes (Signed)
 Dear Dr. Marvine, Here is my assessment for our mutual patient, Jordan Perry. Thank you for allowing me the opportunity to care for your patient. Please do not hesitate to contact me should you have any other questions. Sincerely, Chyrl Cohen PA-C  Otolaryngology Clinic Note Referring provider: Dr. Marvine HPI:  Jordan Perry is a 68 y.o. female kindly referred by Dr. Marvine   The patient is a 68 year old female seen in our office for evaluation of facial trauma.  The patient notes that on 05/14/2024 she had a mechanical fall landing on concrete.  She notes she struck the left side of her face.  No loss of consciousness, no epistaxis.  She denied any changes to her vision, specifically no double vision.  She notes an abrasion to the left side of her face.  She was seen in the emergency room where she had a CT maxillofacial that was significant for acute 2 mm depressed left orbital floor fracture, question acute nondisplaced left orbital roof fracture.  She was discharged home with ENT follow-up.  She has been using red oil on the wound to her face.  She denies any cosmetic changes to her nose, she notes some minimal tenderness to the left side of her nose and the left side of her face.  She denies any difficulty opening closing her jaw, she feels like her occlusion is normal.  She denies any asymmetry to the nose, she notes no nasal obstruction.  She does note a significant history of multiple eye surgeries on the right with decreased vision on the right.  This is unchanged.  Independent Review of Additional Tests or Records:  CT maxillofacial on 05/14/2024  IMPRESSION: 1. No acute intracranial abnormality. 2. Acute 2 mm depressed left orbital floor fracture. Correlate clinically for extraocular muscle entrapment. 3. Question acute nondisplaced left orbital roof fracture. 4. No acute displaced fracture or traumatic listhesis of the cervical spine. 5. Left frontal scalp and periorbital hematoma  formation.     PMH/Meds/All/SocHx/FamHx/ROS:   Past Medical History:  Diagnosis Date   Allergic rhinitis, cause unspecified    Arthritis    Bradycardia    Coronary atherosclerosis of unspecified type of vessel, native or graft    Dysrhythmia    GERD (gastroesophageal reflux disease)    Hypothyroidism    Presence of permanent cardiac pacemaker    Proteinuria    Pure hyperglyceridemia    Type II or unspecified type diabetes mellitus without mention of complication, not stated as uncontrolled    Unspecified essential hypertension      Past Surgical History:  Procedure Laterality Date   COLONOSCOPY N/A 10/02/2019   Procedure: COLONOSCOPY;  Surgeon: Shaaron Lamar HERO, MD;  [facility]: [facility];  Service: Endoscopy;  Laterality: N/A;  9:30   COLONOSCOPY WITH PROPOFOL  N/A 11/24/2022   Procedure: COLONOSCOPY WITH PROPOFOL ;  Surgeon: Shaaron Lamar HERO, MD;  [facility]: [facility];  Service: Endoscopy;  Laterality: N/A;  830am, asa 3   CYSTECTOMY  1989   removed from end of spine   PACEMAKER IMPLANT N/A 12/13/2018   Procedure: PACEMAKER IMPLANT;  Surgeon: Waddell Danelle ORN, MD;  Location: [facility];  Service: Cardiovascular;  Laterality: N/A;   PACEMAKER INSERTION     POLYPECTOMY  10/02/2019   Procedure: POLYPECTOMY;  Surgeon: Shaaron Lamar HERO, MD;  [facility]: [facility];  Service: Endoscopy;;   POLYPECTOMY  11/24/2022   Procedure: POLYPECTOMY INTESTINAL;  Surgeon: Shaaron Lamar HERO, MD;  [facility]: [facility];  Service:  Endoscopy;;   REFRACTIVE SURGERY  1994   Right lens implant      Family History  Problem Relation Age of Onset   COPD Mother 29   Heart disease Father    Cancer Father 62   Coronary artery disease Other    Diabetes Other    Obesity Other    Heart attack Brother 64   Coronary artery disease Sister    Hyperlipidemia Other    Hypertension Other    Colon cancer Neg Hx      Social Connections: Unknown (03/05/2022)   Received [facility]   Social  Network    Social Network: Not on file      Current Outpatient Medications:    Continuous Glucose Sensor (FREESTYLE LIBRE 3 SENSOR) MISC, Change every 14 (fourteen) days., Disp: 6 each, Rfl: 11   dorzolamide -timolol  (COSOPT ) 2-0.5 % ophthalmic solution, Place 1 drop into the right eye 2 (two) times daily., Disp: 30 mL, Rfl: 3   HUMULIN  N KWIKPEN 100 UNIT/ML Kiwkpen, Inject 20-30 Units into the skin in the morning and at bedtime., Disp: , Rfl:    insulin  lispro (HUMALOG ) 100 UNIT/ML KwikPen, Inject 15-25 Units into the skin 3 (three) times daily., Disp: , Rfl:    insulin  lispro (HUMALOG ) 100 UNIT/ML KwikPen, Inject 20 Units into the skin in the morning AND 20 Units daily at 12 noon AND 30 Units 2 (two) times daily at 8 am and 4 pm., Disp: 60 mL, Rfl: 4   insulin  lispro (HUMALOG ) 100 UNIT/ML KwikPen, Inject three times daily - 20 Units into the skin in the morning AND 20 Units daily with lunch AND 30 Units daily with supper as directed., Disp: 60 mL, Rfl: 4   Insulin  NPH, Human,, Isophane, (HUMULIN  N KWIKPEN) 100 UNIT/ML Kiwkpen, Inject 60 Units into the skin every morning and 20 Units into the skin every evening., Disp: 75 mL, Rfl: 3   Insulin  NPH, Human,, Isophane, (HUMULIN  N) 100 UNIT/ML Kiwkpen, Inject 60 Units into the skin in the morning AND 20 Units every evening., Disp: 75 mL, Rfl: 3   latanoprost  (XALATAN ) 0.005 % ophthalmic solution, Place 1 drop into the right eye at bedtime., Disp: 5 mL, Rfl: 5   levothyroxine  (SYNTHROID ) 100 MCG tablet, Take 1 tablet (100 mcg total) by mouth in the morning on an empty stomach., Disp: 90 tablet, Rfl: 1   metFORMIN  (GLUCOPHAGE -XR) 500 MG 24 hr tablet, Take 1 tablet (500 mg total) by mouth 2 (two) times daily., Disp: 180 tablet, Rfl: 3   Multiple Vitamins-Minerals (CENTRUM SILVER 50+WOMEN) TABS, Take 1 tablet by mouth daily., Disp: , Rfl:    Probiotic Product (ALIGN) 4 MG CAPS, Take 1 capsule by mouth daily., Disp: , Rfl:    tirzepatide  (MOUNJARO ) 12.5  MG/0.5ML Pen, Inject 12.5 mg into the skin every 7 (seven) days., Disp: 6 mL, Rfl: 1   aspirin  EC 81 MG tablet, Take 81 mg by mouth daily., Disp: , Rfl:    atorvastatin  (LIPITOR) 80 MG tablet, Take 1 tablet (80 mg total) by mouth at bedtime. (Patient not taking: Reported on 05/01/2024), Disp: 90 tablet, Rfl: 3   atorvastatin  (LIPITOR) 80 MG tablet, Take 1 tablet (80 mg total) by mouth at bedtime. (Patient not taking: Reported on 05/01/2024), Disp: 90 tablet, Rfl: 3   atorvastatin  (LIPITOR) 80 MG tablet, Take 1 tablet (80 mg total) by mouth at bedtime., Disp: 90 tablet, Rfl: 3   brimonidine  (ALPHAGAN ) 0.2 % ophthalmic solution, Place 1 drop into  the right eye 2 (two) times daily. (Patient not taking: Reported on 05/01/2024), Disp: , Rfl:    brimonidine  (ALPHAGAN ) 0.2 % ophthalmic solution, Place 1 drop into the right eye 3 (three) times daily., Disp: 30 mL, Rfl: 3   Continuous Glucose Sensor (FREESTYLE LIBRE 3 PLUS SENSOR) MISC, Apply 1 sensor and change every 15 days. (Patient not taking: Reported on 05/01/2024), Disp: 6 each, Rfl: 5   Continuous Glucose Sensor (FREESTYLE LIBRE 3 SENSOR) MISC, Change every 14 days as directed (Patient not taking: Reported on 05/01/2024), Disp: 2 each, Rfl: 11   dorzolamide -timolol  (COSOPT ) 2-0.5 % ophthalmic solution, Place 1 drop into the right eye 2 (two) times daily. (Patient not taking: Reported on 05/01/2024), Disp: , Rfl:    furosemide  (LASIX ) 20 MG tablet, Take 1 tablet (20 mg total) by mouth as needed. (Patient not taking: Reported on 05/17/2024), Disp: 90 tablet, Rfl: 1   latanoprost  (XALATAN ) 0.005 % ophthalmic solution, Place 1 drop into the right eye at bedtime. (Patient not taking: Reported on 05/01/2024), Disp: , Rfl:    latanoprost  (XALATAN ) 0.005 % ophthalmic solution, Place 1 drop into the right eye at bedtime. (Patient not taking: Reported on 05/01/2024), Disp: 5 mL, Rfl: 5   lisinopril  (ZESTRIL ) 2.5 MG tablet, Take 1 tablet (2.5 mg total) by mouth daily. (Patient not  taking: Reported on 05/01/2024), Disp: 90 tablet, Rfl: 1   lisinopril  (ZESTRIL ) 2.5 MG tablet, Take 1 tablet (2.5 mg total) by mouth daily. (Patient not taking: Reported on 05/01/2024), Disp: 90 tablet, Rfl: 3   lisinopril  (ZESTRIL ) 2.5 MG tablet, Take 1 tablet (2.5 mg total) by mouth daily., Disp: 90 tablet, Rfl: 3   metFORMIN  (GLUCOPHAGE -XR) 500 MG 24 hr tablet, Take 500 mg by mouth 2 (two) times daily after a meal.  (Patient not taking: Reported on 05/01/2024), Disp: , Rfl:    metFORMIN  (GLUCOPHAGE -XR) 500 MG 24 hr tablet, Take 1 tablet (500 mg total) by mouth 2 (two) times daily. (Patient not taking: Reported on 05/01/2024), Disp: 180 tablet, Rfl: 3   MOUNJARO  7.5 MG/0.5ML Pen, Inject 7.5 mg into the skin once a week., Disp: , Rfl:    Polyethyl Glycol-Propyl Glycol (SYSTANE OP), Place 1 drop into both eyes as needed (dry eyes)., Disp: , Rfl:    SYNTHROID  112 MCG tablet, Take 112 mcg by mouth daily. (Patient not taking: Reported on 05/01/2024), Disp: , Rfl:    tirzepatide  (MOUNJARO ) 12.5 MG/0.5ML Pen, Inject 12.5 mg into the skin once a week., Disp: 6 mL, Rfl: 1   Physical Exam:   BP 137/81   Pulse 77   SpO2 95%   Pertinent Findings   CN II-12 intact Facial sensation intact  Right pupil with irregular shape, left pupil equal round reactive to light; vision acuity normal for patient, no double vision  No chemosis or conjunctival hemorrhage  No widened intercanthal  distance  No enophthalmus, proptosis or dystopia  Periocular ecchymosis noted on the left with abrasions to the left face and periocular soft tissue Extraocular movement are intact and pain free No orbital emphysema, eyelids are soft with no decreased retropulsion Palpation of the face reveals no step-off on palpation of the malar eminences, zygomatic arches, or orbital rims, tenderness with palpation of the left orbital rim and maxilla No tenderness with palpation of the TMJ or jaw, normal occlusion   Bilateral EAC clear with enact TM  with well pneumatized middle ear Bruising noted to the left buccal mucosa No septal hematoma  Seprately Identifiable  Procedures:  None  Impression & Plans:  Jordan Perry is a 68 y.o. female with the following   Facial trauma-  68 year old female seen today for follow-up evaluation status post facial trauma.  Her exam is significant for a acute 2 mm depressed left orbital floor fracture.  No signs of entrapment today.  Her vision is at baseline, no double vision.  She does have left-sided septal deviation as well but this is likely old, she denies any changes to her nose or any obstruction.  She does have soft tissue injuries to the face, no lacerations.  She is using red oil, advised her to use bacitracin  instead for 1 week followed by Vaseline.  Avoid UV light.  At this time she does not require formal follow-up evaluation but am happy to see her back in the office if she develops any new or worsening signs or symptoms.  The patient and her husband verbalized understanding and agreement to today's plan had no further questions or concerns.   - f/u PRN   Thank you for allowing me the opportunity to care for your patient. Please do not hesitate to contact me should you have any other questions.  Sincerely, Chyrl Cohen PA-C [facility] Phone: [phone] Fax: [phone]  05/17/2024, 9:03 AM

## 2024-05-28 ENCOUNTER — Other Ambulatory Visit (HOSPITAL_COMMUNITY): Payer: Self-pay

## 2024-06-05 ENCOUNTER — Encounter: Payer: Self-pay | Admitting: Cardiology

## 2024-06-05 ENCOUNTER — Other Ambulatory Visit (HOSPITAL_COMMUNITY): Payer: Self-pay

## 2024-06-05 ENCOUNTER — Ambulatory Visit: Attending: Cardiology | Admitting: Cardiology

## 2024-06-05 VITALS — BP 100/60 | HR 61 | Ht 64.0 in | Wt 175.0 lb

## 2024-06-05 DIAGNOSIS — E782 Mixed hyperlipidemia: Secondary | ICD-10-CM

## 2024-06-05 DIAGNOSIS — I442 Atrioventricular block, complete: Secondary | ICD-10-CM

## 2024-06-05 DIAGNOSIS — Z8249 Family history of ischemic heart disease and other diseases of the circulatory system: Secondary | ICD-10-CM | POA: Diagnosis not present

## 2024-06-05 DIAGNOSIS — I10 Essential (primary) hypertension: Secondary | ICD-10-CM | POA: Diagnosis not present

## 2024-06-05 NOTE — Progress Notes (Signed)
 Clinical Summary Jordan Perry is a 68 y.o.female seen today for follow up of the following medical problems.     1. Family history of CAD - normal stress myoview  in 2009 - remote history of cath when seeing Dr Edith, no significant CAD    - no chest pains, no SOB/DOE   2. HTN - compliant with meds - occasional low bp's at home.  - 30 lbs weight loss since 06/2021. Started on moujaro by pcp - occasional lightheadedness  - significant weight loss, we have been lowering bp meds at appointments due to improving bp   3. Hyperlipidemia - 03/2024 TC 110 TG 237 HDL 36 LDL 37   4. DM2 - followed by pcp   5. LE edema - has prn lasix  but has not needed. Has also improved with weight loss   6. Complete heart block - pacemaker followed by EP, placed 11/2018 - 04/2024 normal device check      SH: sister Rock Deed who was pervious patient of mine who passed away 07-Nov-2015. Her husband Saleah Rishel is also a patient of mine.   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 Past Medical History:  Diagnosis Date   Allergic rhinitis, cause unspecified    Arthritis    Bradycardia    Coronary atherosclerosis of unspecified type of vessel, native or graft    Dysrhythmia    GERD (gastroesophageal reflux disease)    Hypothyroidism    Presence of permanent cardiac pacemaker    Proteinuria    Pure hyperglyceridemia    Type II or unspecified type diabetes mellitus without mention of complication, not stated as uncontrolled    Unspecified essential hypertension      Allergies  Allergen Reactions   Invokana [Canagliflozin] Other (See Comments)    Skin peeling   Janumet [Sitagliptin Phos-Metformin  Hcl] Other (See Comments)    Skin peeling   Cephalexin Other (See Comments)   Penicillins     Has patient had a PCN reaction causing immediate rash, facial/tongue/throat swelling, SOB or lightheadedness with hypotension: Yes-immediate rash Has patient had a PCN reaction causing severe rash  involving mucus membranes or skin necrosis: Yes Has patient had a PCN reaction that required hospitalization: No Has patient had a PCN reaction occurring within the last 10 years: No If all of the above answers are NO, then may proceed with Cephalosporin use.    Prednisone Rash     Current Outpatient Medications  Medication Sig Dispense Refill   aspirin  EC 81 MG tablet Take 81 mg by mouth daily.     atorvastatin  (LIPITOR) 80 MG tablet Take 1 tablet (80 mg total) by mouth at bedtime. (Patient not taking: Reported on 05/01/2024) 90 tablet 3   atorvastatin  (LIPITOR) 80 MG tablet Take 1 tablet (80 mg total) by mouth at bedtime. (Patient not taking: Reported on 05/01/2024) 90 tablet 3   atorvastatin  (LIPITOR) 80 MG tablet Take 1 tablet (80 mg total) by mouth at bedtime. 90 tablet 3   bacitracin  500 UNIT/GM ointment Apply 1 Application topically 2 (two) times daily. 15 g 0   brimonidine  (ALPHAGAN ) 0.2 % ophthalmic solution Place 1 drop into the right eye 2 (two) times daily. (Patient not taking: Reported on 05/01/2024)     brimonidine  (ALPHAGAN ) 0.2 % ophthalmic solution Place 1 drop into the right eye 3 (three) times daily. 30 mL 3   Continuous Glucose Sensor (FREESTYLE LIBRE 3 PLUS SENSOR) MISC Apply 1 sensor and  change every 15 days. (Patient not taking: Reported on 05/01/2024) 6 each 5   Continuous Glucose Sensor (FREESTYLE LIBRE 3 SENSOR) MISC Change every 14 days as directed (Patient not taking: Reported on 05/01/2024) 2 each 11   Continuous Glucose Sensor (FREESTYLE LIBRE 3 SENSOR) MISC Change every 14 (fourteen) days. 6 each 11   dorzolamide -timolol  (COSOPT ) 2-0.5 % ophthalmic solution Place 1 drop into the right eye 2 (two) times daily. (Patient not taking: Reported on 05/01/2024)     dorzolamide -timolol  (COSOPT ) 2-0.5 % ophthalmic solution Place 1 drop into the right eye 2 (two) times daily. 30 mL 3   furosemide  (LASIX ) 20 MG tablet Take 1 tablet (20 mg total) by mouth as needed. (Patient not  taking: Reported on 05/17/2024) 90 tablet 1   HUMULIN  N KWIKPEN 100 UNIT/ML Kiwkpen Inject 20-30 Units into the skin in the morning and at bedtime.     insulin  lispro (HUMALOG ) 100 UNIT/ML KwikPen Inject 15-25 Units into the skin 3 (three) times daily.     insulin  lispro (HUMALOG ) 100 UNIT/ML KwikPen Inject 20 Units into the skin in the morning AND 20 Units daily at 12 noon AND 30 Units 2 (two) times daily at 8 am and 4 pm. 60 mL 4   insulin  lispro (HUMALOG ) 100 UNIT/ML KwikPen Inject three times daily - 20 Units into the skin in the morning AND 20 Units daily with lunch AND 30 Units daily with supper as directed. 60 mL 4   Insulin  NPH, Human,, Isophane, (HUMULIN  N KWIKPEN) 100 UNIT/ML Kiwkpen Inject 60 Units into the skin every morning and 20 Units into the skin every evening. 75 mL 3   Insulin  NPH, Human,, Isophane, (HUMULIN  N) 100 UNIT/ML Kiwkpen Inject 60 Units into the skin in the morning AND 20 Units every evening. 75 mL 3   latanoprost  (XALATAN ) 0.005 % ophthalmic solution Place 1 drop into the right eye at bedtime. (Patient not taking: Reported on 05/01/2024)     latanoprost  (XALATAN ) 0.005 % ophthalmic solution Place 1 drop into the right eye at bedtime. (Patient not taking: Reported on 05/01/2024) 5 mL 5   latanoprost  (XALATAN ) 0.005 % ophthalmic solution Place 1 drop into the right eye at bedtime. 5 mL 5   levothyroxine  (SYNTHROID ) 100 MCG tablet Take 1 tablet (100 mcg total) by mouth in the morning on an empty stomach. 90 tablet 1   lisinopril  (ZESTRIL ) 2.5 MG tablet Take 1 tablet (2.5 mg total) by mouth daily. (Patient not taking: Reported on 05/01/2024) 90 tablet 1   lisinopril  (ZESTRIL ) 2.5 MG tablet Take 1 tablet (2.5 mg total) by mouth daily. (Patient not taking: Reported on 05/01/2024) 90 tablet 3   lisinopril  (ZESTRIL ) 2.5 MG tablet Take 1 tablet (2.5 mg total) by mouth daily. 90 tablet 3   metFORMIN  (GLUCOPHAGE -XR) 500 MG 24 hr tablet Take 500 mg by mouth 2 (two) times daily after a meal.   (Patient not taking: Reported on 05/01/2024)     metFORMIN  (GLUCOPHAGE -XR) 500 MG 24 hr tablet Take 1 tablet (500 mg total) by mouth 2 (two) times daily. (Patient not taking: Reported on 05/01/2024) 180 tablet 3   metFORMIN  (GLUCOPHAGE -XR) 500 MG 24 hr tablet Take 1 tablet (500 mg total) by mouth 2 (two) times daily. 180 tablet 3   MOUNJARO  7.5 MG/0.5ML Pen Inject 7.5 mg into the skin once a week.     Multiple Vitamins-Minerals (CENTRUM SILVER 50+WOMEN) TABS Take 1 tablet by mouth daily.     Polyethyl Glycol-Propyl Glycol (SYSTANE  OP) Place 1 drop into both eyes as needed (dry eyes).     Probiotic Product (ALIGN) 4 MG CAPS Take 1 capsule by mouth daily.     SYNTHROID  112 MCG tablet Take 112 mcg by mouth daily. (Patient not taking: Reported on 05/01/2024)     tirzepatide  (MOUNJARO ) 12.5 MG/0.5ML Pen Inject 12.5 mg into the skin every 7 (seven) days. 6 mL 1   tirzepatide  (MOUNJARO ) 12.5 MG/0.5ML Pen Inject 12.5 mg into the skin once a week. 6 mL 1   No current facility-administered medications for this visit.     Past Surgical History:  Procedure Laterality Date   COLONOSCOPY N/A 10/02/2019   Procedure: COLONOSCOPY;  Surgeon: Shaaron Lamar HERO, MD;  [facility];  Service: Endoscopy;  Laterality: N/A;  9:30   COLONOSCOPY WITH PROPOFOL  N/A 11/24/2022   Procedure: COLONOSCOPY WITH PROPOFOL ;  Surgeon: Shaaron Lamar HERO, MD;  [facility];  Service: Endoscopy;  Laterality: N/A;  830am, asa 3   CYSTECTOMY  1989   removed from end of spine   PACEMAKER IMPLANT N/A 12/13/2018   Procedure: PACEMAKER IMPLANT;  Surgeon: Waddell Danelle ORN, MD;  [facility]: [facility];  Service: Cardiovascular;  Laterality: N/A;   PACEMAKER INSERTION     POLYPECTOMY  10/02/2019   Procedure: POLYPECTOMY;  Surgeon: Shaaron Lamar HERO, MD;  [facility];  Service: Endoscopy;;   POLYPECTOMY  11/24/2022   Procedure: POLYPECTOMY INTESTINAL;  Surgeon: Shaaron Lamar HERO, MD;  [facility];   Service: Endoscopy;;   REFRACTIVE SURGERY  1994   Right lens implant       Allergies  Allergen Reactions   Invokana [Canagliflozin] Other (See Comments)    Skin peeling   Janumet [Sitagliptin Phos-Metformin  Hcl] Other (See Comments)    Skin peeling   Cephalexin Other (See Comments)   Penicillins     Has patient had a PCN reaction causing immediate rash, facial/tongue/throat swelling, SOB or lightheadedness with hypotension: Yes-immediate rash Has patient had a PCN reaction causing severe rash involving mucus membranes or skin necrosis: Yes Has patient had a PCN reaction that required hospitalization: No Has patient had a PCN reaction occurring within the last 10 years: No If all of the above answers are NO, then may proceed with Cephalosporin use.    Prednisone Rash      Family History  Problem Relation Age of Onset   COPD Mother 64   Heart disease Father    Cancer Father 51   Coronary artery disease Other    Diabetes Other    Obesity Other    Heart attack Brother 19   Coronary artery disease Sister    Hyperlipidemia Other    Hypertension Other    Colon cancer Neg Hx      Social History Ms. Tremblay reports that she has never smoked. She has never used smokeless tobacco. Ms. Obando reports no history of alcohol use.    Physical Examination Today's Vitals   06/05/24 1149  BP: 100/60  Pulse: 61  SpO2: 99%  Weight: 175 lb (79.4 kg)  Height: 5' 4 (1.626 m)   Body mass index is 30.04 kg/m.  Gen: resting comfortably, no acute distress HEENT: no scleral icterus, pupils equal round and reactive, no palptable cervical adenopathy,  CV: RRR, no m/rg, no jvd Resp: Clear to auscultation bilaterally GI: abdomen is soft, non-tender, non-distended, normal bowel sounds, no hepatosplenomegaly MSK: extremities are warm, no edema.  Skin: warm, no rash Neuro:  no focal deficits Psych: appropriate affect   Diagnostic Studies  11/2018 echo 1. The left ventricle has  normal systolic function with an ejection fraction of 60-65%. The cavity size was normal. Left ventricular diastolic Doppler parameters are indeterminate.  2. The mitral valve is normal in structure. Mild thickening of the mitral valve leaflet. No evidence of mitral valve stenosis.  3. The tricuspid valve is normal in structure.  4. The aortic valve is tricuspid Mild thickening of the aortic valve no stenosis of the aortic valve.  5. The aortic root is normal in size and structure.  6. Pulmonary hypertension is mildly elevated, PASP is 38 mmHg.  7. The inferior vena cava was dilated in size with >50% respiratory variability.  8. The interatrial septum was not well visualized.  9. RV poorly visualized. Grossly appears enlarged with normal function   Assessment and Plan  1. Family history of CAD - strong family history of CAD. She has no known history, and has had negative cath and prior stress testing Denies any recent symptoms   2. HTN -bp's have come down with dramatic weight loss around 80 lbs, now just on low dose lisinopril  2.5mg  daily. Continue current therapy   3. Hyperlipidemia - LDL at goal, continue dietary and lifestyle modification to improve TGs and HDL     4. Complete heart block - s/p pacemaker placement -no symptoms, recent normal device check - continue to follow with EP  F/ 1 year      Dorn PHEBE Ross, M.D.

## 2024-06-05 NOTE — Patient Instructions (Signed)
 Medication Instructions:   Your physician recommends that you continue on your current medications as directed. Please refer to the Current Medication list given to you today.   Labwork: None today  Testing/Procedures: None today  Follow-Up: 1 year with Dr.Branch  Any Other Special Instructions Will Be Listed Below (If Applicable).  If you need a refill on your cardiac medications before your next appointment, please call your pharmacy.

## 2024-06-18 DIAGNOSIS — D225 Melanocytic nevi of trunk: Secondary | ICD-10-CM | POA: Diagnosis not present

## 2024-06-18 DIAGNOSIS — Z1283 Encounter for screening for malignant neoplasm of skin: Secondary | ICD-10-CM | POA: Diagnosis not present

## 2024-06-18 DIAGNOSIS — L308 Other specified dermatitis: Secondary | ICD-10-CM | POA: Diagnosis not present

## 2024-06-19 ENCOUNTER — Ambulatory Visit (INDEPENDENT_AMBULATORY_CARE_PROVIDER_SITE_OTHER): Payer: BC Managed Care – PPO

## 2024-06-19 DIAGNOSIS — I442 Atrioventricular block, complete: Secondary | ICD-10-CM | POA: Diagnosis not present

## 2024-06-19 LAB — CUP PACEART REMOTE DEVICE CHECK
Battery Remaining Longevity: 62 mo
Battery Remaining Percentage: 48 %
Battery Voltage: 2.99 V
Brady Statistic AP VP Percent: 1 %
Brady Statistic AP VS Percent: 1.1 %
Brady Statistic AS VP Percent: 1 %
Brady Statistic AS VS Percent: 99 %
Brady Statistic RA Percent Paced: 1 %
Brady Statistic RV Percent Paced: 1 %
Date Time Interrogation Session: 20250827020012
Implantable Lead Connection Status: 753985
Implantable Lead Connection Status: 753985
Implantable Lead Implant Date: 20200220
Implantable Lead Implant Date: 20200220
Implantable Lead Location: 753859
Implantable Lead Location: 753860
Implantable Pulse Generator Implant Date: 20200220
Lead Channel Impedance Value: 430 Ohm
Lead Channel Impedance Value: 550 Ohm
Lead Channel Pacing Threshold Amplitude: 0.5 V
Lead Channel Pacing Threshold Amplitude: 0.75 V
Lead Channel Pacing Threshold Pulse Width: 0.5 ms
Lead Channel Pacing Threshold Pulse Width: 0.5 ms
Lead Channel Sensing Intrinsic Amplitude: 1.4 mV
Lead Channel Sensing Intrinsic Amplitude: 7 mV
Lead Channel Setting Pacing Amplitude: 1 V
Lead Channel Setting Pacing Amplitude: 1.5 V
Lead Channel Setting Pacing Pulse Width: 0.5 ms
Lead Channel Setting Sensing Sensitivity: 3 mV
Pulse Gen Model: 2272
Pulse Gen Serial Number: 9107223

## 2024-06-20 ENCOUNTER — Ambulatory Visit: Payer: Self-pay | Admitting: Internal Medicine

## 2024-06-20 DIAGNOSIS — E113213 Type 2 diabetes mellitus with mild nonproliferative diabetic retinopathy with macular edema, bilateral: Secondary | ICD-10-CM | POA: Diagnosis not present

## 2024-06-20 DIAGNOSIS — Z794 Long term (current) use of insulin: Secondary | ICD-10-CM | POA: Diagnosis not present

## 2024-06-20 DIAGNOSIS — T85398A Other mechanical complication of other ocular prosthetic devices, implants and grafts, initial encounter: Secondary | ICD-10-CM | POA: Diagnosis not present

## 2024-06-20 DIAGNOSIS — H4041X0 Glaucoma secondary to eye inflammation, right eye, stage unspecified: Secondary | ICD-10-CM | POA: Diagnosis not present

## 2024-06-20 DIAGNOSIS — H209 Unspecified iridocyclitis: Secondary | ICD-10-CM | POA: Diagnosis not present

## 2024-06-20 DIAGNOSIS — H40022 Open angle with borderline findings, high risk, left eye: Secondary | ICD-10-CM | POA: Diagnosis not present

## 2024-07-08 NOTE — Progress Notes (Signed)
 Remote PPM Transmission

## 2024-07-26 ENCOUNTER — Other Ambulatory Visit (HOSPITAL_COMMUNITY): Payer: Self-pay

## 2024-07-27 ENCOUNTER — Other Ambulatory Visit (HOSPITAL_COMMUNITY): Payer: Self-pay

## 2024-07-27 MED ORDER — MOUNJARO 12.5 MG/0.5ML ~~LOC~~ SOAJ
12.5000 mg | SUBCUTANEOUS | 5 refills | Status: AC
  Filled 2024-07-27: qty 2, 28d supply, fill #0
  Filled 2024-08-20: qty 2, 28d supply, fill #1

## 2024-07-29 ENCOUNTER — Other Ambulatory Visit: Payer: Self-pay

## 2024-08-22 DIAGNOSIS — Z01419 Encounter for gynecological examination (general) (routine) without abnormal findings: Secondary | ICD-10-CM | POA: Diagnosis not present

## 2024-08-28 DIAGNOSIS — R809 Proteinuria, unspecified: Secondary | ICD-10-CM | POA: Diagnosis not present

## 2024-08-28 DIAGNOSIS — E7800 Pure hypercholesterolemia, unspecified: Secondary | ICD-10-CM | POA: Diagnosis not present

## 2024-08-28 DIAGNOSIS — E11319 Type 2 diabetes mellitus with unspecified diabetic retinopathy without macular edema: Secondary | ICD-10-CM | POA: Diagnosis not present

## 2024-08-28 DIAGNOSIS — E039 Hypothyroidism, unspecified: Secondary | ICD-10-CM | POA: Diagnosis not present

## 2024-08-28 DIAGNOSIS — E1165 Type 2 diabetes mellitus with hyperglycemia: Secondary | ICD-10-CM | POA: Diagnosis not present

## 2024-09-04 ENCOUNTER — Other Ambulatory Visit (HOSPITAL_COMMUNITY): Payer: Self-pay

## 2024-09-04 ENCOUNTER — Other Ambulatory Visit: Payer: Self-pay

## 2024-09-04 DIAGNOSIS — E1165 Type 2 diabetes mellitus with hyperglycemia: Secondary | ICD-10-CM | POA: Diagnosis not present

## 2024-09-04 DIAGNOSIS — E039 Hypothyroidism, unspecified: Secondary | ICD-10-CM | POA: Diagnosis not present

## 2024-09-04 DIAGNOSIS — I10 Essential (primary) hypertension: Secondary | ICD-10-CM | POA: Diagnosis not present

## 2024-09-04 DIAGNOSIS — E11319 Type 2 diabetes mellitus with unspecified diabetic retinopathy without macular edema: Secondary | ICD-10-CM | POA: Diagnosis not present

## 2024-09-04 DIAGNOSIS — E7800 Pure hypercholesterolemia, unspecified: Secondary | ICD-10-CM | POA: Diagnosis not present

## 2024-09-04 DIAGNOSIS — R809 Proteinuria, unspecified: Secondary | ICD-10-CM | POA: Diagnosis not present

## 2024-09-04 MED ORDER — FREESTYLE LIBRE 3 PLUS SENSOR MISC
5 refills | Status: AC
  Filled 2024-09-04: qty 6, 90d supply, fill #0

## 2024-09-04 MED ORDER — MOUNJARO 12.5 MG/0.5ML ~~LOC~~ SOAJ
12.5000 mg | SUBCUTANEOUS | 5 refills | Status: AC
  Filled 2024-09-04 – 2024-09-10 (×2): qty 6, 84d supply, fill #0

## 2024-09-05 DIAGNOSIS — E113312 Type 2 diabetes mellitus with moderate nonproliferative diabetic retinopathy with macular edema, left eye: Secondary | ICD-10-CM | POA: Diagnosis not present

## 2024-09-05 DIAGNOSIS — H401122 Primary open-angle glaucoma, left eye, moderate stage: Secondary | ICD-10-CM | POA: Diagnosis not present

## 2024-09-05 DIAGNOSIS — H31091 Other chorioretinal scars, right eye: Secondary | ICD-10-CM | POA: Diagnosis not present

## 2024-09-05 DIAGNOSIS — H4051X2 Glaucoma secondary to other eye disorders, right eye, moderate stage: Secondary | ICD-10-CM | POA: Diagnosis not present

## 2024-09-05 DIAGNOSIS — Z961 Presence of intraocular lens: Secondary | ICD-10-CM | POA: Diagnosis not present

## 2024-09-05 DIAGNOSIS — E113391 Type 2 diabetes mellitus with moderate nonproliferative diabetic retinopathy without macular edema, right eye: Secondary | ICD-10-CM | POA: Diagnosis not present

## 2024-09-08 ENCOUNTER — Other Ambulatory Visit (HOSPITAL_COMMUNITY): Payer: Self-pay

## 2024-09-09 ENCOUNTER — Other Ambulatory Visit: Payer: Self-pay

## 2024-09-10 ENCOUNTER — Other Ambulatory Visit (HOSPITAL_COMMUNITY): Payer: Self-pay

## 2024-09-10 ENCOUNTER — Other Ambulatory Visit: Payer: Self-pay

## 2024-09-18 ENCOUNTER — Ambulatory Visit: Payer: BC Managed Care – PPO

## 2024-09-18 DIAGNOSIS — I442 Atrioventricular block, complete: Secondary | ICD-10-CM

## 2024-09-20 LAB — CUP PACEART REMOTE DEVICE CHECK
Battery Remaining Longevity: 59 mo
Battery Remaining Percentage: 46 %
Battery Voltage: 2.99 V
Brady Statistic AP VP Percent: 1 %
Brady Statistic AP VS Percent: 4.8 %
Brady Statistic AS VP Percent: 1 %
Brady Statistic AS VS Percent: 95 %
Brady Statistic RA Percent Paced: 4.4 %
Brady Statistic RV Percent Paced: 1 %
Date Time Interrogation Session: 20251126024108
Implantable Lead Connection Status: 753985
Implantable Lead Connection Status: 753985
Implantable Lead Implant Date: 20200220
Implantable Lead Implant Date: 20200220
Implantable Lead Location: 753859
Implantable Lead Location: 753860
Implantable Pulse Generator Implant Date: 20200220
Lead Channel Impedance Value: 380 Ohm
Lead Channel Impedance Value: 560 Ohm
Lead Channel Pacing Threshold Amplitude: 0.5 V
Lead Channel Pacing Threshold Amplitude: 0.625 V
Lead Channel Pacing Threshold Pulse Width: 0.5 ms
Lead Channel Pacing Threshold Pulse Width: 0.5 ms
Lead Channel Sensing Intrinsic Amplitude: 1.9 mV
Lead Channel Sensing Intrinsic Amplitude: 9.2 mV
Lead Channel Setting Pacing Amplitude: 0.875
Lead Channel Setting Pacing Amplitude: 1.5 V
Lead Channel Setting Pacing Pulse Width: 0.5 ms
Lead Channel Setting Sensing Sensitivity: 3 mV
Pulse Gen Model: 2272
Pulse Gen Serial Number: 9107223

## 2024-09-23 NOTE — Progress Notes (Signed)
 Remote PPM Transmission

## 2024-09-26 ENCOUNTER — Ambulatory Visit: Payer: Self-pay | Admitting: Internal Medicine

## 2024-10-06 ENCOUNTER — Other Ambulatory Visit: Payer: Self-pay | Admitting: Internal Medicine

## 2024-10-07 ENCOUNTER — Other Ambulatory Visit (HOSPITAL_COMMUNITY): Payer: Self-pay

## 2024-10-07 ENCOUNTER — Other Ambulatory Visit: Payer: Self-pay

## 2024-10-07 MED ORDER — LISINOPRIL 2.5 MG PO TABS
2.5000 mg | ORAL_TABLET | Freq: Every day | ORAL | 1 refills | Status: AC
  Filled 2024-10-07: qty 90, 90d supply, fill #0

## 2024-11-04 NOTE — Progress Notes (Signed)
 Jordan Perry                                          MRN: [id number]   11/04/2024   The [facility] Quality Team Specialist reviewed this patient medical record for the purposes of chart review for care gap closure. The following were reviewed: chart review for care gap closure-kidney health evaluation for diabetes:eGFR  and uACR.    [facility] Quality Team

## 2024-11-11 ENCOUNTER — Other Ambulatory Visit (HOSPITAL_COMMUNITY): Payer: Self-pay

## 2024-11-11 ENCOUNTER — Other Ambulatory Visit: Payer: Self-pay

## 2024-11-11 MED ORDER — LEVOTHYROXINE SODIUM 100 MCG PO TABS
100.0000 ug | ORAL_TABLET | Freq: Every morning | ORAL | 1 refills | Status: AC
  Filled 2024-11-11: qty 90, 90d supply, fill #0

## 2024-11-11 MED ORDER — METFORMIN HCL ER 500 MG PO TB24
500.0000 mg | ORAL_TABLET | Freq: Two times a day (BID) | ORAL | 3 refills | Status: AC
  Filled 2024-11-11: qty 180, 90d supply, fill #0

## 2024-11-15 ENCOUNTER — Other Ambulatory Visit: Payer: Self-pay

## 2024-11-15 ENCOUNTER — Other Ambulatory Visit (HOSPITAL_COMMUNITY): Payer: Self-pay

## 2024-11-15 MED ORDER — LATANOPROST 0.005 % OP SOLN
OPHTHALMIC | 5 refills | Status: AC
  Filled 2024-11-15: qty 5, 100d supply, fill #0

## 2024-12-18 ENCOUNTER — Ambulatory Visit: Payer: BC Managed Care – PPO

## 2025-03-19 ENCOUNTER — Ambulatory Visit: Payer: BC Managed Care – PPO
